# Patient Record
Sex: Male | Born: 1980 | Race: Black or African American | Hispanic: No | Marital: Single | Smoking: Former smoker
Health system: Southern US, Community
[De-identification: ages and names within clinical notes are randomized; demographics above are authoritative.]

## PROBLEM LIST (undated history)

## (undated) ENCOUNTER — Emergency Department (HOSPITAL_COMMUNITY): Disposition: A | Discharge status: Home / Self Care | Payer: Self-pay

## (undated) DIAGNOSIS — I7101 Dissection of thoracic aorta: Secondary | ICD-10-CM

## (undated) DIAGNOSIS — I10 Essential (primary) hypertension: Secondary | ICD-10-CM

## (undated) DIAGNOSIS — Z9889 Other specified postprocedural states: Secondary | ICD-10-CM

## (undated) HISTORY — PX: FOOT SURGERY: SHX648

---
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
## 1999-01-19 ENCOUNTER — Encounter: Admission: RE | Admit: 1999-01-19 | Discharge: 1999-01-19 | Payer: Self-pay | Admitting: Family Medicine

## 2000-05-16 ENCOUNTER — Encounter: Admission: RE | Admit: 2000-05-16 | Discharge: 2000-05-16 | Payer: Self-pay | Admitting: Family Medicine

## 2000-10-20 ENCOUNTER — Encounter: Payer: Self-pay | Admitting: Emergency Medicine

## 2000-10-20 ENCOUNTER — Emergency Department (HOSPITAL_COMMUNITY): Admission: EM | Admit: 2000-10-20 | Discharge: 2000-10-20 | Payer: Self-pay | Admitting: Emergency Medicine

## 2000-10-24 ENCOUNTER — Encounter: Admission: RE | Admit: 2000-10-24 | Discharge: 2000-10-24 | Payer: Self-pay | Admitting: Family Medicine

## 2000-10-28 ENCOUNTER — Encounter: Admission: RE | Admit: 2000-10-28 | Discharge: 2000-10-28 | Payer: Self-pay | Admitting: Family Medicine

## 2001-04-01 ENCOUNTER — Encounter: Admission: RE | Admit: 2001-04-01 | Discharge: 2001-04-01 | Payer: Self-pay | Admitting: Family Medicine

## 2001-07-01 ENCOUNTER — Encounter: Admission: RE | Admit: 2001-07-01 | Discharge: 2001-07-01 | Payer: Self-pay | Admitting: Family Medicine

## 2002-01-18 ENCOUNTER — Encounter: Admission: RE | Admit: 2002-01-18 | Discharge: 2002-01-18 | Payer: Self-pay | Admitting: Family Medicine

## 2002-10-04 ENCOUNTER — Emergency Department (HOSPITAL_COMMUNITY): Admission: EM | Admit: 2002-10-04 | Discharge: 2002-10-04 | Payer: Self-pay | Admitting: Emergency Medicine

## 2003-09-25 ENCOUNTER — Emergency Department (HOSPITAL_COMMUNITY): Admission: EM | Admit: 2003-09-25 | Discharge: 2003-09-25 | Payer: Self-pay | Admitting: Emergency Medicine

## 2003-09-28 ENCOUNTER — Encounter: Admission: RE | Admit: 2003-09-28 | Discharge: 2003-09-28 | Payer: Self-pay | Admitting: Family Medicine

## 2003-10-04 ENCOUNTER — Emergency Department (HOSPITAL_COMMUNITY): Admission: EM | Admit: 2003-10-04 | Discharge: 2003-10-04 | Payer: Self-pay | Admitting: Emergency Medicine

## 2003-10-05 ENCOUNTER — Encounter: Admission: RE | Admit: 2003-10-05 | Discharge: 2003-10-05 | Payer: Self-pay | Admitting: Family Medicine

## 2004-01-09 ENCOUNTER — Emergency Department (HOSPITAL_COMMUNITY): Admission: EM | Admit: 2004-01-09 | Discharge: 2004-01-09 | Payer: Self-pay | Admitting: Emergency Medicine

## 2004-01-12 ENCOUNTER — Emergency Department (HOSPITAL_COMMUNITY): Admission: EM | Admit: 2004-01-12 | Discharge: 2004-01-12 | Payer: Self-pay | Admitting: Emergency Medicine

## 2004-03-12 ENCOUNTER — Emergency Department (HOSPITAL_COMMUNITY): Admission: EM | Admit: 2004-03-12 | Discharge: 2004-03-12 | Payer: Self-pay | Admitting: Emergency Medicine

## 2004-03-15 ENCOUNTER — Emergency Department (HOSPITAL_COMMUNITY): Admission: EM | Admit: 2004-03-15 | Discharge: 2004-03-15 | Payer: Self-pay | Admitting: Emergency Medicine

## 2004-06-18 ENCOUNTER — Emergency Department (HOSPITAL_COMMUNITY): Admission: EM | Admit: 2004-06-18 | Discharge: 2004-06-18 | Payer: Self-pay | Admitting: Emergency Medicine

## 2005-03-17 ENCOUNTER — Emergency Department (HOSPITAL_COMMUNITY): Admission: EM | Admit: 2005-03-17 | Discharge: 2005-03-17 | Payer: Self-pay | Admitting: Emergency Medicine

## 2005-06-06 ENCOUNTER — Emergency Department (HOSPITAL_COMMUNITY): Admission: EM | Admit: 2005-06-06 | Discharge: 2005-06-06 | Payer: Self-pay | Admitting: Emergency Medicine

## 2006-07-24 DIAGNOSIS — I10 Essential (primary) hypertension: Secondary | ICD-10-CM

## 2006-07-24 DIAGNOSIS — F17200 Nicotine dependence, unspecified, uncomplicated: Secondary | ICD-10-CM | POA: Insufficient documentation

## 2006-12-25 ENCOUNTER — Emergency Department (HOSPITAL_COMMUNITY): Admission: EM | Admit: 2006-12-25 | Discharge: 2006-12-25 | Payer: Self-pay | Admitting: Emergency Medicine

## 2007-01-17 ENCOUNTER — Emergency Department (HOSPITAL_COMMUNITY): Admission: EM | Admit: 2007-01-17 | Discharge: 2007-01-17 | Payer: Self-pay | Admitting: Emergency Medicine

## 2007-01-31 ENCOUNTER — Emergency Department (HOSPITAL_COMMUNITY): Admission: EM | Admit: 2007-01-31 | Discharge: 2007-01-31 | Payer: Self-pay | Admitting: Emergency Medicine

## 2007-02-20 ENCOUNTER — Emergency Department (HOSPITAL_COMMUNITY): Admission: EM | Admit: 2007-02-20 | Discharge: 2007-02-20 | Payer: Self-pay | Admitting: Emergency Medicine

## 2007-08-17 ENCOUNTER — Emergency Department (HOSPITAL_COMMUNITY): Admission: EM | Admit: 2007-08-17 | Discharge: 2007-08-17 | Payer: Self-pay | Admitting: Emergency Medicine

## 2008-01-05 ENCOUNTER — Emergency Department (HOSPITAL_COMMUNITY): Admission: EM | Admit: 2008-01-05 | Discharge: 2008-01-05 | Payer: Self-pay | Admitting: Emergency Medicine

## 2008-03-08 ENCOUNTER — Emergency Department (HOSPITAL_COMMUNITY): Admission: EM | Admit: 2008-03-08 | Discharge: 2008-03-08 | Payer: Self-pay | Admitting: Emergency Medicine

## 2008-03-20 ENCOUNTER — Emergency Department (HOSPITAL_COMMUNITY): Admission: EM | Admit: 2008-03-20 | Discharge: 2008-03-20 | Payer: Self-pay | Admitting: Emergency Medicine

## 2008-09-07 ENCOUNTER — Emergency Department (HOSPITAL_COMMUNITY): Admission: EM | Admit: 2008-09-07 | Discharge: 2008-09-07 | Payer: Self-pay | Admitting: Emergency Medicine

## 2008-11-03 ENCOUNTER — Emergency Department (HOSPITAL_COMMUNITY): Admission: EM | Admit: 2008-11-03 | Discharge: 2008-11-03 | Payer: Self-pay | Admitting: Emergency Medicine

## 2009-01-24 ENCOUNTER — Emergency Department (HOSPITAL_COMMUNITY): Admission: EM | Admit: 2009-01-24 | Discharge: 2009-01-24 | Payer: Self-pay | Admitting: Emergency Medicine

## 2009-04-19 ENCOUNTER — Emergency Department (HOSPITAL_COMMUNITY): Admission: EM | Admit: 2009-04-19 | Discharge: 2009-04-19 | Payer: Self-pay | Admitting: Family Medicine

## 2009-12-23 ENCOUNTER — Observation Stay (HOSPITAL_COMMUNITY): Admission: EM | Admit: 2009-12-23 | Discharge: 2009-12-24 | Payer: Self-pay | Admitting: Emergency Medicine

## 2010-03-28 ENCOUNTER — Emergency Department (HOSPITAL_COMMUNITY)
Admission: EM | Admit: 2010-03-28 | Discharge: 2010-03-28 | Payer: Self-pay | Source: Home / Self Care | Admitting: Family Medicine

## 2010-03-28 ENCOUNTER — Ambulatory Visit (HOSPITAL_COMMUNITY): Admission: RE | Admit: 2010-03-28 | Discharge: 2010-03-28 | Payer: Self-pay | Admitting: Family Medicine

## 2010-08-11 LAB — CBC
MCH: 31 pg (ref 26.0–34.0)
MCHC: 35.3 g/dL (ref 30.0–36.0)
MCV: 87.7 fL (ref 78.0–100.0)
Platelets: 249 10*3/uL (ref 150–400)
RBC: 5.38 MIL/uL (ref 4.22–5.81)

## 2010-08-11 LAB — POCT I-STAT, CHEM 8
BUN: 12 mg/dL (ref 6–23)
Calcium, Ion: 1.1 mmol/L — ABNORMAL LOW (ref 1.12–1.32)
Creatinine, Ser: 1.2 mg/dL (ref 0.4–1.5)
Glucose, Bld: 118 mg/dL — ABNORMAL HIGH (ref 70–99)
Hemoglobin: 17.7 g/dL — ABNORMAL HIGH (ref 13.0–17.0)
TCO2: 20 mmol/L (ref 0–100)

## 2010-08-11 LAB — DIFFERENTIAL
Basophils Relative: 1 % (ref 0–1)
Eosinophils Absolute: 0.2 10*3/uL (ref 0.0–0.7)
Eosinophils Relative: 2 % (ref 0–5)
Lymphs Abs: 4.5 10*3/uL — ABNORMAL HIGH (ref 0.7–4.0)
Neutrophils Relative %: 45 % (ref 43–77)

## 2010-09-03 LAB — GC/CHLAMYDIA PROBE AMP, GENITAL
Chlamydia, DNA Probe: NEGATIVE
GC Probe Amp, Genital: NEGATIVE

## 2010-09-03 LAB — URINALYSIS, ROUTINE W REFLEX MICROSCOPIC
Bilirubin Urine: NEGATIVE
Hgb urine dipstick: NEGATIVE
Ketones, ur: NEGATIVE mg/dL
Specific Gravity, Urine: 1.023 (ref 1.005–1.030)
Urobilinogen, UA: 1 mg/dL (ref 0.0–1.0)
pH: 6 (ref 5.0–8.0)

## 2010-09-12 ENCOUNTER — Emergency Department (HOSPITAL_COMMUNITY)
Admission: EM | Admit: 2010-09-12 | Discharge: 2010-09-13 | Disposition: A | Discharge status: Home / Self Care | Payer: Self-pay | Attending: Emergency Medicine | Admitting: Emergency Medicine

## 2010-09-12 DIAGNOSIS — I10 Essential (primary) hypertension: Secondary | ICD-10-CM | POA: Insufficient documentation

## 2010-09-12 DIAGNOSIS — R509 Fever, unspecified: Secondary | ICD-10-CM | POA: Insufficient documentation

## 2010-09-12 DIAGNOSIS — W57XXXA Bitten or stung by nonvenomous insect and other nonvenomous arthropods, initial encounter: Secondary | ICD-10-CM | POA: Insufficient documentation

## 2010-09-12 DIAGNOSIS — S30860A Insect bite (nonvenomous) of lower back and pelvis, initial encounter: Secondary | ICD-10-CM | POA: Insufficient documentation

## 2010-09-12 DIAGNOSIS — J3489 Other specified disorders of nose and nasal sinuses: Secondary | ICD-10-CM | POA: Insufficient documentation

## 2010-09-12 DIAGNOSIS — R51 Headache: Secondary | ICD-10-CM | POA: Insufficient documentation

## 2010-10-20 ENCOUNTER — Emergency Department (HOSPITAL_COMMUNITY)
Admission: EM | Admit: 2010-10-20 | Discharge: 2010-10-21 | Disposition: A | Discharge status: Home / Self Care | Payer: Self-pay | Attending: Emergency Medicine | Admitting: Emergency Medicine

## 2010-10-20 DIAGNOSIS — R197 Diarrhea, unspecified: Secondary | ICD-10-CM | POA: Insufficient documentation

## 2010-10-20 DIAGNOSIS — I10 Essential (primary) hypertension: Secondary | ICD-10-CM | POA: Insufficient documentation

## 2010-10-20 DIAGNOSIS — IMO0001 Reserved for inherently not codable concepts without codable children: Secondary | ICD-10-CM | POA: Insufficient documentation

## 2010-10-20 DIAGNOSIS — R509 Fever, unspecified: Secondary | ICD-10-CM | POA: Insufficient documentation

## 2010-10-20 DIAGNOSIS — L02219 Cutaneous abscess of trunk, unspecified: Secondary | ICD-10-CM | POA: Insufficient documentation

## 2010-10-20 DIAGNOSIS — E860 Dehydration: Secondary | ICD-10-CM | POA: Insufficient documentation

## 2010-10-20 LAB — POCT I-STAT, CHEM 8
Calcium, Ion: 1.14 mmol/L (ref 1.12–1.32)
Chloride: 104 mEq/L (ref 96–112)
Creatinine, Ser: 1.6 mg/dL — ABNORMAL HIGH (ref 0.4–1.5)
Glucose, Bld: 94 mg/dL (ref 70–99)
Potassium: 3.6 mEq/L (ref 3.5–5.1)

## 2010-12-18 ENCOUNTER — Inpatient Hospital Stay (INDEPENDENT_AMBULATORY_CARE_PROVIDER_SITE_OTHER)
Admission: RE | Admit: 2010-12-18 | Discharge: 2010-12-18 | Disposition: A | Discharge status: Home / Self Care | Payer: Self-pay | Source: Ambulatory Visit | Attending: Emergency Medicine | Admitting: Emergency Medicine

## 2010-12-18 DIAGNOSIS — L03039 Cellulitis of unspecified toe: Secondary | ICD-10-CM

## 2010-12-22 LAB — CULTURE, ROUTINE-ABSCESS: Culture: NO GROWTH

## 2011-02-25 LAB — COMPREHENSIVE METABOLIC PANEL
Albumin: 4.4
BUN: 12
Calcium: 9.6
Chloride: 104
Creatinine, Ser: 1.07
Total Bilirubin: 0.9

## 2011-02-25 LAB — GC/CHLAMYDIA PROBE AMP, GENITAL
Chlamydia, DNA Probe: NEGATIVE
GC Probe Amp, Genital: NEGATIVE
GC Probe Amp, Genital: NEGATIVE

## 2011-02-25 LAB — URINALYSIS, ROUTINE W REFLEX MICROSCOPIC
Bilirubin Urine: NEGATIVE
Ketones, ur: NEGATIVE
Protein, ur: NEGATIVE
Urobilinogen, UA: 0.2

## 2011-02-25 LAB — DIFFERENTIAL
Basophils Absolute: 0
Lymphocytes Relative: 38
Lymphs Abs: 2.3
Monocytes Absolute: 0.8
Neutro Abs: 2.9

## 2011-02-25 LAB — CBC
HCT: 47.4
MCHC: 34.6
MCV: 87.6
Platelets: 262
RDW: 13.3
WBC: 6

## 2011-02-25 LAB — RPR: RPR Ser Ql: NONREACTIVE

## 2011-03-07 LAB — CULTURE, ROUTINE-ABSCESS

## 2011-03-08 LAB — URINALYSIS, ROUTINE W REFLEX MICROSCOPIC
Protein, ur: NEGATIVE
Urobilinogen, UA: 1

## 2011-03-08 LAB — DIFFERENTIAL
Basophils Relative: 0
Eosinophils Absolute: 0.3
Neutrophils Relative %: 52

## 2011-03-08 LAB — CBC
MCHC: 35.5
MCV: 84.7
Platelets: 269

## 2011-03-08 LAB — BASIC METABOLIC PANEL
BUN: 14
CO2: 26
Chloride: 107
Creatinine, Ser: 1.22

## 2011-03-08 LAB — GC/CHLAMYDIA PROBE AMP, GENITAL: GC Probe Amp, Genital: NEGATIVE

## 2011-03-11 LAB — URINALYSIS, ROUTINE W REFLEX MICROSCOPIC
Glucose, UA: NEGATIVE
Hgb urine dipstick: NEGATIVE
Specific Gravity, Urine: 1.03
pH: 6

## 2011-03-11 LAB — URINE CULTURE
Colony Count: NO GROWTH
Culture: NO GROWTH

## 2011-07-18 ENCOUNTER — Encounter (HOSPITAL_COMMUNITY): Payer: Self-pay | Admitting: Emergency Medicine

## 2011-07-18 ENCOUNTER — Emergency Department (INDEPENDENT_AMBULATORY_CARE_PROVIDER_SITE_OTHER): Payer: Self-pay

## 2011-07-18 ENCOUNTER — Emergency Department (INDEPENDENT_AMBULATORY_CARE_PROVIDER_SITE_OTHER)
Admission: EM | Admit: 2011-07-18 | Discharge: 2011-07-18 | Disposition: A | Discharge status: Home Health | Payer: Self-pay | Source: Home / Self Care | Attending: Emergency Medicine | Admitting: Emergency Medicine

## 2011-07-18 DIAGNOSIS — J111 Influenza due to unidentified influenza virus with other respiratory manifestations: Secondary | ICD-10-CM

## 2011-07-18 HISTORY — DX: Essential (primary) hypertension: I10

## 2011-07-18 MED ORDER — GUAIFENESIN-CODEINE 100-10 MG/5ML PO SYRP: 10.0000 mL | ORAL_SOLUTION | Freq: Four times a day (QID) | ORAL | Status: AC | PRN

## 2011-07-18 MED ORDER — OSELTAMIVIR PHOSPHATE 75 MG PO CAPS: 75.0000 mg | ORAL_CAPSULE | Freq: Two times a day (BID) | ORAL | Status: AC

## 2011-07-18 MED ORDER — TRAMADOL HCL 50 MG PO TABS: 100.0000 mg | ORAL_TABLET | Freq: Three times a day (TID) | ORAL | Status: AC | PRN

## 2011-07-18 NOTE — Discharge Instructions (Signed)
Influenza Facts Flu (influenza) is a contagious respiratory illness caused by the influenza viruses. It can cause mild to severe illness. While most healthy people recover from the flu without specific treatment and without complications, older people, young children, and people with certain health conditions are at higher risk for serious complications from the flu, including death. CAUSES   The flu virus is spread from person to person by respiratory droplets from coughing and sneezing.   A person can also become infected by touching an object or surface with a virus on it and then touching their mouth, eye or nose.   Adults may be able to infect others from 1 day before symptoms occur and up to 7 days after getting sick. So it is possible to give someone the flu even before you know you are sick and continue to infect others while you are sick.  SYMPTOMS   Fever (usually high).   Headache.   Tiredness (can be extreme).   Cough.   Sore throat.   Runny or stuffy nose.   Body aches.   Diarrhea and vomiting may also occur, particularly in children.   These symptoms are referred to as "flu-like symptoms". A lot of different illnesses, including the common cold, can have similar symptoms.  DIAGNOSIS   There are tests that can determine if you have the flu as long you are tested within the first 2 or 3 days of illness.   A doctor's exam and additional tests may be needed to identify if you have a disease that is a complicating the flu.  RISKS AND COMPLICATIONS  Some of the complications caused by the flu include:  Bacterial pneumonia or progressive pneumonia caused by the flu virus.   Loss of body fluids (dehydration).   Worsening of chronic medical conditions, such as heart failure, asthma, or diabetes.   Sinus problems and ear infections.  HOME CARE INSTRUCTIONS   Seek medical care early on.   If you are at high risk from complications of the flu, consult your health-care  provider as soon as you develop flu-like symptoms. Those at high risk for complications include:   People 65 years or older.   People with chronic medical conditions, including diabetes.   Pregnant women.   Young children.   Your caregiver may recommend use of an antiviral medication to help treat the flu.   If you get the flu, get plenty of rest, drink a lot of liquids, and avoid using alcohol and tobacco.   You can take over-the-counter medications to relieve the symptoms of the flu if your caregiver approves. (Never give aspirin to children or teenagers who have flu-like symptoms, particularly fever).  PREVENTION  The single best way to prevent the flu is to get a flu vaccine each fall. Other measures that can help protect against the flu are:  Antiviral Medications   A number of antiviral drugs are approved for use in preventing the flu. These are prescription medications, and a doctor should be consulted before they are used.   Habits for Good Health   Cover your nose and mouth with a tissue when you cough or sneeze, throw the tissue away after you use it.   Wash your hands often with soap and water, especially after you cough or sneeze. If you are not near water, use an alcohol-based hand cleaner.   Avoid people who are sick.   If you get the flu, stay home from work or school. Avoid contact with   other people so that you do not make them sick, too.   Try not to touch your eyes, nose, or mouth as germs ore often spread this way.  IN CHILDREN, EMERGENCY WARNING SIGNS THAT NEED URGENT MEDICAL ATTENTION:  Fast breathing or trouble breathing.   Bluish skin color.   Not drinking enough fluids.   Not waking up or not interacting.   Being so irritable that the child does not want to be held.   Flu-like symptoms improve but then return with fever and worse cough.   Fever with a rash.  IN ADULTS, EMERGENCY WARNING SIGNS THAT NEED URGENT MEDICAL ATTENTION:  Difficulty  breathing or shortness of breath.   Pain or pressure in the chest or abdomen.   Sudden dizziness.   Confusion.   Severe or persistent vomiting.  SEEK IMMEDIATE MEDICAL CARE IF:  You or someone you know is experiencing any of the symptoms above. When you arrive at the emergency center,report that you think you have the flu. You may be asked to wear a mask and/or sit in a secluded area to protect others from getting sick. MAKE SURE YOU:   Understand these instructions.   Monitor your condition.   Seek medical care if you are getting worse, or not improving.  Document Released: 05/16/2003 Document Revised: 01/23/2011 Document Reviewed: 02/09/2009 [employer] Patient Information 2012 [employer].   Most upper respiratory infections are caused by viruses and do not require antibiotics.  We try to save the antibiotics for when we really need them to avoid resistance.  This does not mean that there is nothing that can be done.  Here are a few hints about things that can be done at home to get over an upper respiratory infection quicker:  Get extra sleep and extra fluids.  Get 7 to 9 hours of sleep per night and 6 to 8 glasses of water a day.  Getting extra sleep keeps the immune system from getting run down.  Most people with an upper respiratory infection are a little dehydrated.  The extra fluids also keep the secretions liquified and easier to deal with.  Also, get extra vitamin C.  4000 mg per day is the recommended dose. For the aches, headache, and fever, acetaminophen or ibuprofen are helpful.  These can be alternated every 4 hours.  People with liver disease should avoid large amounts of acetaminophen, and people with ulcer disease, gastroesophageal reflux, gastritis, congestive heart failure, chronic kidney disease, coronary artery disease and the elderly should avoid ibuprofen. For nasal congestion try Mucinex-D, or if you're having lots of sneezing or copious clear nasal drainage  Allegra-D-24 hour.  A Saline nasal spray such as Ocean Spray can also help as can decongestant sprays such as Afrin, but you should not use the decongestant sprays for more than 3 or 4 days since they can be habituating.  If nasal dryness is a problem, Ayr Nasal Gel can help moisturize your nasal passages.  Breath Rite nasal strips can also offer a non-drug alternative treatment to nasal congestion, especially at night. For people with symptoms of sinusitis, sleeping with your head elevated can be helpful.  For sinus pain, moist, hot compresses to the face may provide some relief.  Many people find that inhaling steam as in a shower or from a pot of steaming water can help. For sore throat, zinc containing lozenges such as Cold-Eze or Zicam are helpful.  Zinc helps to fight infection and has a mild astringent effect that   relieves the sore, achey throat.  Hot salt water gargles (8 oz of hot water, 1/2 tsp of table salt, and a pinch of baking soda) can give relief as well as hot beverages such as hot tea. For the cough, old time remedies such as honey or honey and lemon are tried and true.  Over the counter cough syrups such as Delsym 2 tsp every 12 hours can help as well.  It's important when you have an upper respiratory infection not to pass the infection to others.  This involves being very careful about the following:  Frequent hand washing or use of hand sanitizer, especially after coughing, sneezing, blowing your nose or touching your face, nose or eyes. Do not shake hands or touch anyone and try to avoid touching surfaces that other people use such as doorknobs, shopping carts, telephones and computer keyboards. Use tissues and dispose of them properly in a garbage can or ziplock bag. Cough into your sleeve. Do not let others eat or drink after you.  It's also important to recognize the signs of serious illness and get evaluated if they occur: Any respiratory infection that lasts more than 7 to  10 days.  Yellow nasal drainage and sputum are not reliable indicators of a bacterial infection, but if they last for more than 1 week, see your doctor. Fever and sore throat can indicate strep. Fever and cough can indicate influenza or pneumonia. Any kind of severe symptom such as difficulty breathing, intractable vomiting, or severe pain should prompt you to see a doctor as soon as possible.   Your body's immune system is really the thing that will get rid of this infection.  Your immune system is comprised of 2 types of specialized cells called T cells and B cells.  T cells coordinate the array of cells in your body that engulf invading bacteria or viruses while B cells orchestrate the production of antibodies that neutralize infection.  Anything we do or any medications we give you, will just strengthen your immune system or help it clear up the infection quicker.  Here are a few helpful hints to improve your immune system to help overcome this illness or to prevent future infections:  A few vitamins can improve the health of your immune system.  That's why your diet should include plenty of fruits, vegetables, fish, nuts, and whole grains.  Vitamin A and bet-carotene can increase the cells that fight infections (T cells and B cells).  Vitamin A is abundant in dark greens and orange vegetables such as spinach, greens, sweet potatoes, and carrots.  Vitamin B6 contributes to the maturation of white blood cells, the cells that fight disease.  Foods with vitamin B6 include cold cereal and bananas.  Vitamin C is credited with preventing colds because it increases white blood cells and also prevents cellular damage.  Citrus fruits, peaches and green and red bell peppers are all hight in vitamin C.  Vitamin E is an anti-oxidant that encourages the production of natural killer cells which reject foreign invaders and B cells that produce antibodies.  Foods high in vitamin E include wheat germ, nuts and  seeds.  Foods high in omega-3 fatty acids found in foods like salmon, tuna and mackerel boost your immune system and help cells to engulf and absorb germs.  Probiotics are good bacteria that increase your T cells.  These can be found in yogurt and are available in supplements such as Culturelle or Align.  Moderate exercise increases the   strength of your immune system and your ability to recover from illness.  I suggest 3 to 5 moderate intensity 30 minute workouts per week.    Sleep is another component of maintaining a strong immune system.  It enables your body to recuperate from the day's activities, stress and work.  My recommendation is to get between 7 and 9 hours of sleep per night.  If you smoke, try to quit completely or at least cut down.  Drink alcohol only in moderation if at all.  No more than 2 drinks daily for men or 1 for women.  Get a flu vaccine early in the fall or if you have not gotten one yet, once this illness has run its course.  If you are over 65, a smoker, or an asthmatic, get a pneumococcal vaccine.  My final recommendation is to maintain a healthy weight.  Excess weight can impair the immune system by interfering with the way the immune system deals with invading viruses or bacteria.   

## 2011-07-18 NOTE — ED Notes (Signed)
HERE WITH FLU LIKE SX THAT STARTED X 3DYS AGO WITH SORE THROAT AND NOW WORSENING COUGH WITH DK YELLOW MUCOUS,BODY ACHES,CHILLS AND DIFF SWALLOWING OR EATING,SOB AND CP.AFEBRILE.PT HAS BEEN TAKING OTC MEDS BUT NOT WORKING

## 2011-07-18 NOTE — ED Provider Notes (Signed)
Chief Complaint  Patient presents with  . Influenza  . Sore Throat    History of Present Illness:   The patient is a 31 year old male who presents today with a 2 to three-day history of chills, fever, myalgias, headache, and chest pain. He also has had a cough productive of yellow to brown sputum with some blood, wheezing, severe sore throat, and nasal congestion with yellow drainage. He denies any nausea, vomiting, or diarrhea. He has not been exposed to strep, flu, or mono.  Review of Systems:  Other than noted above, the patient denies any of the following symptoms. Systemic:  No fever, chills, sweats, fatigue, myalgias, headache, or anorexia. Eye:  No redness, pain or drainage. ENT:  No earache, nasal congestion, rhinorrhea, sinus pressure, or sore throat. Lungs:  No cough, sputum production, wheezing, shortness of breath. Or chest pain. GI:  No nausea, vomiting, abdominal pain or diarrhea. Skin:  No rash or itching.  PMFSH:  Past medical history, family history, social history, meds, and allergies were reviewed.  Physical Exam:   Vital signs:  BP 111/76  Pulse 101  Temp(Src) 98.3 F (36.8 C) (Oral)  Resp 18  SpO2 96% General:  Alert, in no distress. Eye:  No conjunctival injection or drainage. ENT:  TMs and canals were normal, without erythema or inflammation.  Nasal mucosa was clear and uncongested, without drainage.  Mucous membranes were moist.  Pharynx was clear, without exudate or drainage.  There were no oral ulcerations or lesions. Neck:  Supple, no adenopathy, tenderness or mass. Lungs:  No respiratory distress.  Lungs were clear to auscultation, without wheezes, rales or rhonchi.  Breath sounds were clear and equal bilaterally. Heart:  Regular rhythm, without gallops, murmers or rubs. Skin:  Clear, warm, and dry, without rash or lesions.  Labs:   Results for orders placed during the hospital encounter of 07/18/11  POCT RAPID STREP A ([facility] ONLY)      Component  Value Range   Streptococcus, Group A Screen (Direct) NEGATIVE  NEGATIVE   POCT INFECTIOUS MONO SCREEN      Component Value Range   Mono Screen NEGATIVE  NEGATIVE      Radiology:  Dg Chest 2 View  07/18/2011  *RADIOLOGY REPORT*  Clinical Data: Left-sided chest pain  CHEST - 2 VIEW  Comparison: 09/07/2008  Findings: Normal heart size.  Clear lungs.  IMPRESSION: Negative.  Original Report Authenticated By: Donavan Burnet, M.D.    Assessment:   Diagnoses that have been ruled out:  None  Diagnoses that are still under consideration:  None  Final diagnoses:  Influenza-like illness      Plan:   1.  The following meds were prescribed:   New Prescriptions   GUAIFENESIN-CODEINE (GUIATUSS AC) 100-10 MG/5ML SYRUP    Take 10 mLs by mouth 4 (four) times daily as needed for cough.   OSELTAMIVIR (TAMIFLU) 75 MG CAPSULE    Take 1 capsule (75 mg total) by mouth every 12 (twelve) hours.   TRAMADOL (ULTRAM) 50 MG TABLET    Take 2 tablets (100 mg total) by mouth every 8 (eight) hours as needed for pain.   2.  The patient was instructed in symptomatic care and handouts were given. 3.  The patient was told to return if becoming worse in any way, if no better in 3 or 4 days, and given some red flag symptoms that would indicate earlier return.   Roque Lias, MD 07/18/11 [phone]

## 2013-10-22 ENCOUNTER — Emergency Department (INDEPENDENT_AMBULATORY_CARE_PROVIDER_SITE_OTHER)
Admission: EM | Admit: 2013-10-22 | Discharge: 2013-10-22 | Disposition: A | Discharge status: Home / Self Care | Payer: PRIVATE HEALTH INSURANCE | Source: Home / Self Care | Attending: Family Medicine | Admitting: Family Medicine

## 2013-10-22 ENCOUNTER — Encounter (HOSPITAL_COMMUNITY): Payer: Self-pay | Admitting: Emergency Medicine

## 2013-10-22 DIAGNOSIS — T675XXA Heat exhaustion, unspecified, initial encounter: Secondary | ICD-10-CM

## 2013-10-22 DIAGNOSIS — X58XXXA Exposure to other specified factors, initial encounter: Secondary | ICD-10-CM

## 2013-10-22 DIAGNOSIS — I10 Essential (primary) hypertension: Secondary | ICD-10-CM

## 2013-10-22 LAB — POCT I-STAT, CHEM 8
BUN: 16 mg/dL (ref 6–23)
CHLORIDE: 104 meq/L (ref 96–112)
Calcium, Ion: 1.16 mmol/L (ref 1.12–1.23)
Creatinine, Ser: 1.1 mg/dL (ref 0.50–1.35)
Glucose, Bld: 102 mg/dL — ABNORMAL HIGH (ref 70–99)
HEMATOCRIT: 50 % (ref 39.0–52.0)
Hemoglobin: 17 g/dL (ref 13.0–17.0)
Potassium: 3.8 mEq/L (ref 3.7–5.3)
SODIUM: 141 meq/L (ref 137–147)
TCO2: 27 mmol/L (ref 0–100)

## 2013-10-22 MED ORDER — AMLODIPINE BESYLATE 5 MG PO TABS: 5.0000 mg | ORAL_TABLET | Freq: Every day | ORAL | Status: DC

## 2013-10-22 NOTE — ED Notes (Signed)
C/o hypertension States he is dizzy, feels lightheaded, feet and hands are numb and sweaty body States while he was at work sx came as he was outside taking out the [employer] he drinks plenty of water daily States he used to take BP meds but has been out for two months

## 2013-10-22 NOTE — ED Provider Notes (Signed)
CSN: [id number]     Arrival date & time 10/22/13  1422 History   First MD Initiated Contact with Patient 10/22/13 1545     Chief Complaint  Patient presents with  . Hypertension   (Consider location/radiation/quality/duration/timing/severity/associated sxs/prior Treatment) HPI Comments: 33 year old male presents complaining of having an episode for dizziness earlier at work. He was wearing all black clothes working out in the hot sun when he became lightheaded, extremely sweaty, and his hands and feet felt numb. He went inside and sat in air conditioning and put a cool towel on his neck and this all got better. He is feeling fine now. He thinks this may have been from heat or it may have been because he is out of his blood pressure medicine. He diagnosed with hypertension at age 1 and was put on Lopressor, however he does not like the way it makes him feel so he stopped going to the doctor 2 years ago. He does not currently have a primary care provider. He does have health insurance now and he wants to know who he should go see. Right now, he is completely asymptomatic. He does smoke cigarettes.  He drinks plenty of fluids. He does not watch his diet but is not overweight because he stays active.  Patient is a 33 y.o. male presenting with hypertension.  Hypertension    Past Medical History  Diagnosis Date  . Hypertension    Past Surgical History  Procedure Laterality Date  . Foot surgery     History reviewed. No pertinent family history. History  Substance Use Topics  . Smoking status: Current Every Day Smoker  . Smokeless tobacco: Not on file  . Alcohol Use: Yes    Review of Systems  Constitutional: Positive for diaphoresis.  Neurological: Positive for dizziness and numbness.  All other systems reviewed and are negative.   Allergies  Review of patient's allergies indicates no known allergies.  Home Medications   Prior to Admission medications   Medication Sig Start Date  End Date Taking? Authorizing Provider  amLODipine (NORVASC) 5 MG tablet Take 1 tablet (5 mg total) by mouth daily. 10/22/13   Adrian Blackwater Shevelle Smither, PA-C  lisinopril (PRINIVIL,ZESTRIL) 10 MG tablet Take 10 mg by mouth daily.    Historical Provider, MD   BP 156/96  Pulse 90  Temp(Src) 98.2 F (36.8 C) (Oral)  Resp 18  SpO2 100% Physical Exam  Nursing note and vitals reviewed. Constitutional: He is oriented to person, place, and time. He appears well-developed and well-nourished. No distress.  HENT:  Head: Normocephalic.  Cardiovascular: Normal rate, regular rhythm and normal heart sounds.   Pulmonary/Chest: Effort normal and breath sounds normal. No respiratory distress.  Neurological: He is alert and oriented to person, place, and time. He has normal strength. No cranial nerve deficit or sensory deficit. Coordination and gait normal.  Skin: Skin is warm and dry. No rash noted. He is not diaphoretic.  Psychiatric: He has a normal mood and affect. Judgment normal.    ED Course  Procedures (including critical care time) Labs Review Labs Reviewed - No data to display  Imaging Review No results found.   MDM   1. Heat exhaustion, unspecified   2. Hypertension    Transient episode of heat exhaustion, he is fine now. I will start him on antihypertensive, I discussed with him strategies for finding a primary care provider. Followup as needed   Meds ordered this encounter  Medications  . amLODipine (NORVASC) 5 MG tablet  Sig: Take 1 tablet (5 mg total) by mouth daily.    Dispense:  30 tablet    Refill:  1    Order Specific Question:  Supervising Provider    Answer:  Bradd CanaryKINDL, JAMES D [[id number]]       Graylon GoodZachary H Rye Decoste, PA-C 10/22/13 [phone]

## 2013-10-22 NOTE — Discharge Instructions (Signed)
[facility]: [address] [place] [address]  [phone]  [facility]: [address] [place] [address]   [phone]  [facility]: [address] [place] [address]  [phone]  [facility] : [address] [place] [address] [phone]  [facility]: [address] [place] [address] [phone]  [facility]: [address] [place] [address] [phone]  Dr. Oneal Grout [address] [facility] [place] [facility] [place] [address]  [phone]  Dr. Jackie Plum, [facility]. [address] [place], [place] [address]  [phone]     Heat Disorders Heat related disorders are illnesses caused by continued exposure to hot and humid environments, not drinking enough fluids, and/or your body failing to regulate its temperature correctly. People suffer from heat stress and heat related disorders when their bodies are unable to compensate and cool down through sweating. With sufficient heat, sweating is not enough to keep you cool, and your body temperature can rise quickly. Very high body temperatures can damage your brain and other vital organs. High humidity (moisture in the air), adds to heat stress, because it is harder for sweat to evaporate and cool your body. Heat stress and disorders are not uncommon. Some medicines can increase your risk for heat related illness. Ask your caregiver about your medicines during periods of intense heat.  Heat related disorders include:  Heatstroke. When you cannot sweat or regulate your body temperature in an adequate way. This is very dangerous and can be life threatening. Get emergency medical help.  Heat exhaustion. Overheating causes heavy sweating and a fast heart  rate. Your body can still regulate its own temperature.  Heat cramps. Painful, uncontrollable muscle spasms. Can occur during heavy exercise in hot environments.  Sunburn. Skin becomes red and painful (burned) after being out in the sun.  Heat rash. Sweat ducts become blocked, which traps sweat under the skin. This causes blisters and red bumps and may cause an itchy or tingling feeling. PREVENTING HEAT STRESS AND HEAT RELATED DISORDERS Overheating can be dangerous and life threatening. When exercising, working, or doing other activities in hot and humid environments, do the following:  Stay informed by listening to and watching local broadcast weather and safety updates during intense heat.  Air conditioning is the best way to prevent heat disorders. If your home is not air conditioned, spend time in air conditioned places (malls, [employer], or heat shelters set up by your local health department).  Wear light-weight, light colored, loose fitting clothing. Wear as little clothing as possible when at home.  Increase your fluid intake. Drink enough water and fluids to keep your urine clear or pale yellow. DO NOT WAIT UNTIL YOU ARE THIRSTY TO DRINK. You may already be heat stressed, and not recognize it.  If your caregiver has suggested that you limit the amount of fluid you drink or has prescribed water pills for a medical problem, ask how much you should drink when the weather is hot.  Do not drink liquids with alcohol, caffeine, or lots of sugar. They can cause more loss of body fluid.  Heavy sweating drains your body's salt and minerals, which must be replaced. If you must exercise in the heat, a sports beverage can replace the salt and minerals you lose in sweat. If you  are on a low-salt diet, check with your caregiver before drinking a sports beverage.  Sunburn reduces your body's ability to cool itself and causes a loss of needed body fluids. If you go outdoors, protect yourself  from the sun by wearing a wide-brimmed hat, along with sunglasses.  Put on sunscreen of SPF 15 or higher, 30 minutes before going out. (The most effective products say "broad spectrum" or "UVA/UVB protection" on the label.) Reapply sunscreen frequently -- at least every 1-2 hours.  Take added precautions when both the heat and humidity are high.  Rest often.  Even young and otherwise healthy people can become heat stressed and suffer from a heat disorder, if they participate in strenuous activities during hot weather.  If you must be outdoors, try going out only during morning and evening hours, when it is cooler. Rest often in shady areas, so that your body's temperature can adjust.  If your heart pounds or you are gasping for breath, STOP all activity. Go immediately to a cool area, or at least into the shade, and rest. This is especially true if you become lightheaded, confused, weak, or faint.  Electric fans may make you comfortable, but they DO NOT prevent heat related problems. SYMPTOMS   Headache.  Nosebleed.  Weakness.  You feel very hot.  Muscle cramps.  Restlessness.  Fainting or dizziness.  Fast breathing and shortness of breath.  Excessive sweating. (There may be little or no sweating in late stages of heat exhaustion.)  Rapid pulse, heart pounding.  Feeling sick to your stomach (nauseous, vomiting).  Skin becoming cold and clammy, or excessively hot and dry. HOME CARE INSTRUCTIONS   Lie down and rest in a cool or air conditioned area.  Drink enough water and fluids to keep your urine clear or pale yellow. Avoid fluids with caffeine or high sugar content. Avoid coffee, tea, alcohol or stimulants.  Do not take salt tablets, unless advised by your caregiver.  Avoid hot foods and heavy meals.  Bathe or shower in cool water.  Wear minimal clothing.  Use a fan. Add cool or warm mist to the air, if possible.  If possible, decrease the use of your stove or  oven at home.  Monitor adults at risk at least twice a day, watching closely for signs of heat exhaustion or heat stroke. Infants and young children also require more frequent watching.  Never leave infants, children or pets in a parked car, even if the windows are cracked open.  If you are 34 years of age or older, have a friend or relative call to check on you twice a day during a heat wave. If you know someone in this age group, check on them at least twice a day. SEEK IMMEDIATE MEDICAL CARE IF:  You have a hard time breathing.  You vomit or pass blood in your stool.  You have a seizure, feel dizzy or faint, or pass out.  You develop severe sweating.  Your skin is red, hot and dry (there is no sweating).  Your urine turns a dark color or has blood in it.  You are making very little or no urine.  You are unable to keep fluids down.  You develop chest or abdominal pain.  You develop a throbbing headache.  You develop nausea or confusion. IF YOU OBSERVE SOMEONE WHO MIGHT HAVE HEAT STROKE This can be life threatening. Call your local emergency services (911 in the [place]  If the victim is in the  sun, get him or her to a shady area.  Cool the victim rapidly, using whatever methods you have:  Place the victim in a tub of cool water or a cool shower.  Spray the victim with cool water from a garden hose, or sponge the person with cool water.  Wrap the victim in a cool, wet sheet and fan them.  If emergency medical help is delayed, call the hospital emergency room or your local emergency services (911 in the [place] for further instructions.  Sometimes a victim's muscles will begin to twitch from heat stroke. If this happens, keep the victim from injuring himself. However, do not place any object in the mouth. Give fluids, unless the muscle twitching makes it difficult or unsafe to do so. If there is vomiting, make sure the airway remains open by turning the victim on his or her  side. Document Released: 05/10/2000 Document Revised: 08/05/2011 Document Reviewed: 02/27/2009 [facility] Patient Information 2014 [place], [place].  Hypertension As your heart beats, it forces blood through your arteries. This force is your blood pressure. If the pressure is too high, it is called hypertension (HTN) or high blood pressure. HTN is dangerous because you may have it and not know it. High blood pressure may mean that your heart has to work harder to pump blood. Your arteries may be narrow or stiff. The extra work puts you at risk for heart disease, stroke, and other problems.  Blood pressure consists of two numbers, a higher number over a lower, 110/72, for example. It is stated as "110 over 72." The ideal is below 120 for the top number (systolic) and under 80 for the bottom (diastolic). Write down your blood pressure today. You should pay close attention to your blood pressure if you have certain conditions such as:  Heart failure.  Prior heart attack.  Diabetes  Chronic kidney disease.  Prior stroke.  Multiple risk factors for heart disease. To see if you have HTN, your blood pressure should be measured while you are seated with your arm held at the level of the heart. It should be measured at least twice. A one-time elevated blood pressure reading (especially in the Emergency Department) does not mean that you need treatment. There may be conditions in which the blood pressure is different between your right and left arms. It is important to see your caregiver soon for a recheck. Most people have essential hypertension which means that there is not a specific cause. This type of high blood pressure may be lowered by changing lifestyle factors such as:  Stress.  Smoking.  Lack of exercise.  Excessive weight.  Drug/tobacco/alcohol use.  Eating less salt. Most people do not have symptoms from high blood pressure until it has caused damage to the body. Effective treatment  can often prevent, delay or reduce that damage. TREATMENT  When a cause has been identified, treatment for high blood pressure is directed at the cause. There are a large number of medications to treat HTN. These fall into several categories, and your caregiver will help you select the medicines that are best for you. Medications may have side effects. You should review side effects with your caregiver. If your blood pressure stays high after you have made lifestyle changes or started on medicines,   Your medication(s) may need to be changed.  Other problems may need to be addressed.  Be certain you understand your prescriptions, and know how and when to take your medicine.  Be sure to  follow up with your caregiver within the time frame advised (usually within two weeks) to have your blood pressure rechecked and to review your medications.  If you are taking more than one medicine to lower your blood pressure, make sure you know how and at what times they should be taken. Taking two medicines at the same time can result in blood pressure that is too low. SEEK IMMEDIATE MEDICAL CARE IF:  You develop a severe headache, blurred or changing vision, or confusion.  You have unusual weakness or numbness, or a faint feeling.  You have severe chest or abdominal pain, vomiting, or breathing problems. MAKE SURE YOU:   Understand these instructions.  Will watch your condition.  Will get help right away if you are not doing well or get worse. Document Released: 05/13/2005 Document Revised: 08/05/2011 Document Reviewed: 01/01/2008 [facility] Patient Information 2014 [place], [place]. Go to www.goodrx.com to look up your medications. This will give you a list of where you can find your prescriptions at the most affordable prices.

## 2013-10-22 NOTE — ED Provider Notes (Signed)
Medical screening examination/treatment/procedure(s) were performed by resident physician or non-physician practitioner and as supervising physician I was immediately available for consultation/collaboration.   KINDL,JAMES DOUGLAS MD.   James D Kindl, MD 10/22/13 1658 

## 2013-11-15 DIAGNOSIS — N4829 Other inflammatory disorders of penis: Secondary | ICD-10-CM | POA: Insufficient documentation

## 2013-11-15 DIAGNOSIS — F17200 Nicotine dependence, unspecified, uncomplicated: Secondary | ICD-10-CM | POA: Insufficient documentation

## 2013-11-15 DIAGNOSIS — Z79899 Other long term (current) drug therapy: Secondary | ICD-10-CM | POA: Insufficient documentation

## 2013-11-15 DIAGNOSIS — B009 Herpesviral infection, unspecified: Secondary | ICD-10-CM | POA: Insufficient documentation

## 2013-11-15 DIAGNOSIS — N4889 Other specified disorders of penis: Secondary | ICD-10-CM | POA: Insufficient documentation

## 2013-11-15 DIAGNOSIS — I10 Essential (primary) hypertension: Secondary | ICD-10-CM | POA: Insufficient documentation

## 2013-11-16 ENCOUNTER — Encounter (HOSPITAL_COMMUNITY): Payer: Self-pay | Admitting: Emergency Medicine

## 2013-11-16 ENCOUNTER — Emergency Department (HOSPITAL_COMMUNITY)
Admission: EM | Admit: 2013-11-16 | Discharge: 2013-11-16 | Disposition: A | Discharge status: Home / Self Care | Payer: PRIVATE HEALTH INSURANCE | Attending: Emergency Medicine | Admitting: Emergency Medicine

## 2013-11-16 DIAGNOSIS — B009 Herpesviral infection, unspecified: Secondary | ICD-10-CM

## 2013-11-16 DIAGNOSIS — N489 Disorder of penis, unspecified: Secondary | ICD-10-CM

## 2013-11-16 MED ORDER — ACYCLOVIR 800 MG PO TABS: 400.0000 mg | ORAL_TABLET | Freq: Every day | ORAL | Status: DC

## 2013-11-16 NOTE — ED Provider Notes (Signed)
CSN: [id number]     Arrival date & time 11/15/13  2338 History   First MD Initiated Contact with Patient 11/16/13 [phone]     Chief Complaint  Patient presents with  . Groin Swelling     (Consider location/radiation/quality/duration/timing/severity/associated sxs/prior Treatment) The history is provided by the patient.  Patient is complaining of an itchy insect bit to the abdominal wall and itching and a bump on the penis.  No f/c/r.  No pain no discharge has had unprotected encounters in the past.    Past Medical History  Diagnosis Date  . Hypertension    Past Surgical History  Procedure Laterality Date  . Foot surgery     History reviewed. No pertinent family history. History  Substance Use Topics  . Smoking status: Current Every Day Smoker  . Smokeless tobacco: Not on file  . Alcohol Use: Yes    Review of Systems  Genitourinary: Positive for penile swelling. Negative for dysuria, discharge and scrotal swelling.  All other systems reviewed and are negative.     Allergies  Review of patient's allergies indicates no known allergies.  Home Medications   Prior to Admission medications   Medication Sig Start Date End Date Taking? Authorizing Provider  acetaminophen (TYLENOL) 500 MG tablet Take 500 mg by mouth every 6 (six) hours as needed (pain).   Yes Historical Provider, MD  Metoprolol Tartrate (LOPRESSOR PO) Take by mouth.   Yes Historical Provider, MD  amLODipine (NORVASC) 5 MG tablet Take 1 tablet (5 mg total) by mouth daily. 10/22/13   Adrian BlackwaterZachary H Baker, PA-C  lisinopril (PRINIVIL,ZESTRIL) 10 MG tablet Take 10 mg by mouth daily.    Historical Provider, MD   BP 169/109  Pulse 83  Temp(Src) 97.5 F (36.4 C) (Oral)  Resp 20  Ht 6\' 1"  (1.854 m)  SpO2 99% Physical Exam  Constitutional: He is oriented to person, place, and time. He appears well-developed and well-nourished. No distress.  HENT:  Head: Normocephalic and atraumatic.  Eyes: Conjunctivae are normal.  Pupils are equal, round, and reactive to light.  Neck: Normal range of motion. Neck supple.  Cardiovascular: Normal rate, regular rhythm and intact distal pulses.   Pulmonary/Chest: Effort normal and breath sounds normal. He has no wheezes. He has no rales.  Abdominal: Soft. Bowel sounds are normal. There is no tenderness. There is no rebound.  Insolated insect bite RLQ  Genitourinary: No penile tenderness.  Has ulcerative lesions where the shaft of the penis meets the head,  There is loose tissue along the underside of the shaft without warmth erythema or fluctuance.  Chaperone present  Musculoskeletal: Normal range of motion.  Neurological: He is alert and oriented to person, place, and time.  Skin: Skin is warm and dry. No erythema.  Psychiatric: He has a normal mood and affect.    ED Course  Procedures (including critical care time) Labs Review Labs Reviewed - No data to display  Imaging Review No results found.   EKG Interpretation None      MDM   Final diagnoses:  None    Patient is only concerned about the skin on the underside of the penis, there is no warmth erythema or fluctuance.  There is nothing to drain or image.  This lesion may be reactive in nature.  Due to itching will treat ulcerative lesions will treat for herpes and have advised follow up with urology for lesion on the shaft of the penis.  Apply benadryl cream to insect bite.  Jasmine AweApril K Palumbo-Rasch, MD 11/16/13 [phone]

## 2013-11-16 NOTE — ED Notes (Signed)
Pt states he noticed he had a bump on his penis today. Pt denies pain to area but states bump itches. Pt is alert and oriented. Denies any drainage to groin area.

## 2017-03-24 ENCOUNTER — Ambulatory Visit: Payer: Self-pay | Admitting: Physician Assistant

## 2017-03-24 ENCOUNTER — Encounter (HOSPITAL_COMMUNITY): Payer: Self-pay | Admitting: Emergency Medicine

## 2017-03-24 ENCOUNTER — Ambulatory Visit (HOSPITAL_COMMUNITY)
Admission: EM | Admit: 2017-03-24 | Discharge: 2017-03-24 | Disposition: A | Discharge status: Home / Self Care | Payer: PRIVATE HEALTH INSURANCE | Attending: Emergency Medicine | Admitting: Emergency Medicine

## 2017-03-24 DIAGNOSIS — S39012A Strain of muscle, fascia and tendon of lower back, initial encounter: Secondary | ICD-10-CM

## 2017-03-24 MED ORDER — KETOROLAC TROMETHAMINE 60 MG/2ML IM SOLN
INTRAMUSCULAR | Status: AC
  Filled 2017-03-24: qty 2

## 2017-03-24 MED ORDER — AMLODIPINE BESYLATE 5 MG PO TABS: 5.0000 mg | ORAL_TABLET | Freq: Every day | ORAL | 1 refills | Status: DC

## 2017-03-24 MED ORDER — KETOROLAC TROMETHAMINE 60 MG/2ML IM SOLN
60.0000 mg | Freq: Once | INTRAMUSCULAR | Status: AC
  Administered 2017-03-24: 60 mg via INTRAMUSCULAR

## 2017-03-24 MED ORDER — METHOCARBAMOL 500 MG PO TABS: 500.0000 mg | ORAL_TABLET | Freq: Three times a day (TID) | ORAL | 0 refills | Status: DC | PRN

## 2017-03-24 NOTE — ED Provider Notes (Signed)
[facility]    CSN: [id number] Arrival date & time: 03/24/17  1445     History   Chief Complaint Chief Complaint  Patient presents with  . Back Pain    HPI Derrick Clayton is a 36 y.o. male.   [name] presents with complaints of right low back pain which started 10/25 after carrying his son from a football field to their vehicle. The following morning the pain was more severe. It has persisted. Sharp at times. Movement worsens the pain. Does not radiate to buttocks or thigh. Rates pain 10/10 at its worse. States had similar pain after injury at the gym years ago. No previous back surgery. Denies urinary symptoms or gi symptoms. Denies loss of bladder or bowel. Denies numbness tingling or extremity weakness. He has tried applying heat and muscle rub, this morning took ibuprofen, has not taken since. States that when he sits for a long period of time he has to stand to improve pain. Bending is more painful as well.   bp noted to be elevated toady. Patient states he does not have health insurance therefore has stopped filling any prescriptions and does not have a PCP. Denies headache, vision changes, dizziness, shortness of breath or chest pain.   ROS per HPI.       Past Medical History:  Diagnosis Date  . Hypertension     Patient Active Problem List   Diagnosis Date Noted  . TOBACCO DEPENDENCE 07/24/2006  . HYPERTENSION, BENIGN SYSTEMIC 07/24/2006    Past Surgical History:  Procedure Laterality Date  . FOOT SURGERY         Home Medications    Prior to Admission medications   Medication Sig Start Date End Date Taking? Authorizing Provider  acetaminophen (TYLENOL) 500 MG tablet Take 500 mg by mouth every 6 (six) hours as needed (pain).    [provider]  acyclovir (ZOVIRAX) 800 MG tablet Take 0.5 tablets (400 mg total) by mouth 5 (five) times daily. 11/16/13   Palumbo, April, MD  amLODipine (NORVASC) 5 MG tablet Take 1 tablet (5 mg total) by mouth  daily. 03/24/17   Georgetta Haber, NP  lisinopril (PRINIVIL,ZESTRIL) 10 MG tablet Take 10 mg by mouth daily.    [provider]  methocarbamol (ROBAXIN) 500 MG tablet Take 1 tablet (500 mg total) by mouth every 8 (eight) hours as needed for muscle spasms. May cause drowsiness 03/24/17   Linus Mako B, NP  Metoprolol Tartrate (LOPRESSOR PO) Take by mouth.    [provider]    Family History History reviewed. No pertinent family history.  Social History Social History  Substance Use Topics  . Smoking status: Current Every Day Smoker  . Smokeless tobacco: Not on file  . Alcohol use Yes     Allergies   Patient has no known allergies.   Review of Systems Review of Systems   Physical Exam Triage Vital Signs ED Triage Vitals  Enc Vitals Group     BP 03/24/17 1541 (!) 198/108     Pulse Rate 03/24/17 1541 (!) 102     Resp 03/24/17 1541 18     Temp 03/24/17 1541 98.3 F (36.8 C)     Temp Source 03/24/17 1541 Oral     SpO2 03/24/17 1541 100 %     Weight --      Height --      Head Circumference --      Peak Flow --      Pain  Score 03/24/17 1542 10     Pain Loc --      Pain Edu? --      Excl. in GC? --    No data found.   Updated Vital Signs BP (!) 198/108 (BP Location: Right Arm)   Pulse (!) 102   Temp 98.3 F (36.8 C) (Oral)   Resp 18   SpO2 100%   Visual Acuity Right Eye Distance:   Left Eye Distance:   Bilateral Distance:    Right Eye Near:   Left Eye Near:    Bilateral Near:     Physical Exam  Constitutional: He is oriented to person, place, and time. He appears well-developed and well-nourished. No distress.  Eyes: Pupils are equal, round, and reactive to light. Conjunctivae and EOM are normal.  Cardiovascular: Normal rate, regular rhythm and normal heart sounds.   Pulses:      Dorsalis pedis pulses are 2+ on the right side, and 2+ on the left side.  Pulmonary/Chest: Effort normal and breath sounds normal. No respiratory  distress. He exhibits no tenderness.  Abdominal: Soft. He exhibits no distension. There is no tenderness.  Musculoskeletal:       Lumbar back: He exhibits tenderness, pain and spasm. He exhibits normal range of motion, no bony tenderness and no swelling.       Back:  Pain exacerbated with forward flexion and with torso twisting; reproducible right musculature pain with palpation; no pain with straight leg raise or toe or heel ambulation ; leg strength 5/5 bilaterally   Neurological: He is alert and oriented to person, place, and time. He has normal reflexes. He displays normal reflexes. No sensory deficit. He exhibits normal muscle tone. Coordination normal.  Skin: Skin is warm and dry. No rash noted.  Vitals reviewed.    UC Treatments / Results  Labs (all labs ordered are listed, but only abnormal results are displayed) Labs Reviewed - No data to display  EKG  EKG Interpretation None       Radiology No results found.  Procedures Procedures (including critical care time)  Medications Ordered in UC Medications  ketorolac (TORADOL) injection 60 mg (60 mg Intramuscular Given 03/24/17 1616)     Initial Impression / Assessment and Plan / UC Course  I have reviewed the triage vital signs and the nursing notes.  Pertinent labs & imaging results that were available during my care of the patient were reviewed by me and considered in my medical decision making (see chart for details).     Back pain consistent with muscular strain. Light activity as tolerated. Continue with NSAID, take with food. Muscle relaxer at night as needed, drowsiness precautions provided. Restarted amlodipine for patient and encouraged use with recheck of BP and follow up within 1 week. Community [facility] wellness center information provided as patient states he does not have insurance. If symptoms worsen or do not improve in the next week to return to be seen or to follow up with PCP. Patient verbalized  understanding and agreeable to plan. Ambulatory out of clinic without difficulty.    Georgetta HaberNatalie B Bulah Lurie, NP 03/24/2017 4:32 PM   Final Clinical Impressions(s) / UC Diagnoses   Final diagnoses:  Strain of lumbar region, initial encounter    New Prescriptions Discharge Medication List as of 03/24/2017  4:15 PM    START taking these medications   Details  methocarbamol (ROBAXIN) 500 MG tablet Take 1 tablet (500 mg total) by mouth every 8 (eight) hours as  needed for muscle spasms. May cause drowsiness, Starting Mon 03/24/2017, Normal         Controlled Substance Prescriptions [place] Controlled Substance Registry consulted? Not Applicable   Georgetta Haber, NP 03/24/17 [phone]

## 2017-03-24 NOTE — ED Triage Notes (Addendum)
Pt here for lower back pain worse on right side after carrying his son last week; pt has not had htn meds in months

## 2017-03-24 NOTE — Discharge Instructions (Signed)
Take Naproxen 500mg  twice a day, take with food. May use heat during the day, I recommend ice at night. Use of muscle relaxer at night. Take blood pressure medication daily. Please have your BP rechecked in 1 week. Please establish with a primary care provider for recheck and continued management.

## 2017-05-02 ENCOUNTER — Other Ambulatory Visit: Payer: Self-pay

## 2017-05-02 ENCOUNTER — Emergency Department (HOSPITAL_COMMUNITY)
Admission: EM | Admit: 2017-05-02 | Discharge: 2017-05-02 | Disposition: A | Discharge status: Home / Self Care | Payer: Self-pay | Attending: Emergency Medicine | Admitting: Emergency Medicine

## 2017-05-02 ENCOUNTER — Encounter (HOSPITAL_COMMUNITY): Payer: Self-pay

## 2017-05-02 DIAGNOSIS — L0231 Cutaneous abscess of buttock: Secondary | ICD-10-CM | POA: Insufficient documentation

## 2017-05-02 DIAGNOSIS — I10 Essential (primary) hypertension: Secondary | ICD-10-CM | POA: Insufficient documentation

## 2017-05-02 DIAGNOSIS — L0291 Cutaneous abscess, unspecified: Secondary | ICD-10-CM

## 2017-05-02 DIAGNOSIS — F17210 Nicotine dependence, cigarettes, uncomplicated: Secondary | ICD-10-CM | POA: Insufficient documentation

## 2017-05-02 DIAGNOSIS — Z79899 Other long term (current) drug therapy: Secondary | ICD-10-CM | POA: Insufficient documentation

## 2017-05-02 DIAGNOSIS — L03317 Cellulitis of buttock: Secondary | ICD-10-CM | POA: Insufficient documentation

## 2017-05-02 MED ORDER — LIDOCAINE-EPINEPHRINE 2 %-1:100000 IJ SOLN
20.0000 mL | Freq: Once | INTRAMUSCULAR | Status: AC
  Administered 2017-05-02: 20 mL via INTRADERMAL
  Filled 2017-05-02: qty 20

## 2017-05-02 MED ORDER — SULFAMETHOXAZOLE-TRIMETHOPRIM 800-160 MG PO TABS: 1.0000 | ORAL_TABLET | Freq: Two times a day (BID) | ORAL | 0 refills | Status: AC

## 2017-05-02 NOTE — ED Provider Notes (Signed)
[facility]-EMERGENCY DEPT Provider Note   CSN: [id number] Arrival date & time: 05/02/17  [id number]     History   Chief Complaint Chief Complaint  Patient presents with  . Abscess    HPI Derrick Clayton is a 36 y.o. male.  HPI  Mr. Derrick Clayton is a 36 year old male with a history of hypertension, tobacco use who presents to the emergency department for evaluation of left buttocks abscess.  He states that he has had abscesses in the axilla in the past, this is his first on the buttocks.  Abscess is located on the left buttocks and began about 4 days ago. It has gradually worsened in pain and size.  Pain is severe, worsened when he sits.  He denies fever, nausea/vomiting, painful passing of stool.  Reports that he was able to express some purulent drainage about 2 days ago.  Past Medical History:  Diagnosis Date  . Hypertension     Patient Active Problem List   Diagnosis Date Noted  . TOBACCO DEPENDENCE 07/24/2006  . HYPERTENSION, BENIGN SYSTEMIC 07/24/2006    Past Surgical History:  Procedure Laterality Date  . FOOT SURGERY         Home Medications    Prior to Admission medications   Medication Sig Start Date End Date Taking? Authorizing Provider  acetaminophen (TYLENOL) 500 MG tablet Take 500 mg by mouth every 6 (six) hours as needed (pain).    [provider]  acyclovir (ZOVIRAX) 800 MG tablet Take 0.5 tablets (400 mg total) by mouth 5 (five) times daily. 11/16/13   Palumbo, April, MD  amLODipine (NORVASC) 5 MG tablet Take 1 tablet (5 mg total) by mouth daily. 03/24/17   Georgetta HaberBurky, Natalie B, NP  lisinopril (PRINIVIL,ZESTRIL) 10 MG tablet Take 10 mg by mouth daily.    [provider]  methocarbamol (ROBAXIN) 500 MG tablet Take 1 tablet (500 mg total) by mouth every 8 (eight) hours as needed for muscle spasms. May cause drowsiness 03/24/17   Linus MakoBurky, Natalie B, NP  Metoprolol Tartrate (LOPRESSOR PO) Take by mouth.    [provider]     Family History Family History  Problem Relation Age of Onset  . Cancer Mother   . Heart failure Father     Social History Social History   Tobacco Use  . Smoking status: Current Every Day Smoker    Packs/day: 0.00    Types: Cigarettes  . Smokeless tobacco: Never Used  Substance Use Topics  . Alcohol use: Yes    Comment: 3-4 days a week  . Drug use: No     Allergies   Patient has no known allergies.   Review of Systems Review of Systems  Constitutional: Negative for chills, fatigue and fever.  Gastrointestinal: Negative for abdominal pain, anal bleeding, blood in stool, nausea, rectal pain and vomiting.  Musculoskeletal: Positive for myalgias (left buttocks pain).  Skin: Positive for color change (erythematous left buttocks). Negative for wound.     Physical Exam Updated Vital Signs BP (!) 172/108 Comment: Just took BP medication   Pulse (!) 104   Temp 98.1 F (36.7 C) (Oral)   Resp 15   Ht 6\' 1"  (1.854 m)   Wt 101.6 kg (224 lb)   SpO2 100%   BMI 29.55 kg/m   Physical Exam  Constitutional: He appears well-developed and well-nourished. No distress.  HENT:  Head: Normocephalic and atraumatic.  Eyes: Right eye exhibits no discharge. Left eye exhibits no discharge.  Cardiovascular: Normal rate  and regular rhythm. Exam reveals no friction rub.  No murmur heard. Pulmonary/Chest: Effort normal and breath sounds normal. No stridor. No respiratory distress. He has no wheezes. He has no rales.  Genitourinary:  Genitourinary Comments: Chaperone present for exam. No gross blood noted on rectal exam, normal tone, no tenderness, no mass or fissure, no hemorrhoids noted.  Musculoskeletal:  Left buttocks with approximately 3 cm area of erythema and induration with fluctuance in the center.  No active drainage.  Tender to the touch.  Does not track towards the rectum.  Neurological: He is alert. Coordination normal.  Skin: Skin is warm and dry. Capillary refill takes  less than 2 seconds. He is not diaphoretic.  Psychiatric: He has a normal mood and affect. His behavior is normal.  Nursing note and vitals reviewed.   ED Treatments / Results  Labs (all labs ordered are listed, but only abnormal results are displayed) Labs Reviewed - No data to display  EKG  EKG Interpretation None       Radiology No results found.  Procedures EMERGENCY DEPARTMENT US SOFT TISSUE INTERPRETATION "Study: Limited Soft Tissue Ultrasound"  INDICATIONS: Soft tissue infection Multiple views of the body part were obtained in real-time with a multi-frequency linear probe  PERFORMED BY: Myself IMAGES ARCHIVED?: Yes SIDE:Left BODY PART:buttocks INTERPRETATION:  Abcess present and Cellulitis present    .Marland KitchenIncision and Drainage Date/Time: 05/02/2017 12:04 PM Performed by: Kellie Shropshire, PA-C Authorized by: Kellie Shropshire, PA-C   Consent:    Consent obtained:  Verbal and emergent situation   Consent given by:  Patient   Risks discussed:  Incomplete drainage, infection, bleeding and pain   Alternatives discussed:  No treatment Location:    Type:  Abscess   Location: left buttocks. Pre-procedure details:    Skin preparation:  Betadine Anesthesia (see MAR for exact dosages):    Anesthesia method:  Local infiltration   Local anesthetic:  Lidocaine 2% WITH epi Procedure type:    Complexity:  Simple Procedure details:    Incision types:  Single straight   Scalpel blade:  11   Wound management:  Probed and deloculated and irrigated with saline   Drainage:  Purulent   Drainage amount:  Copious   Wound treatment:  Wound left open   Packing materials:  None Post-procedure details:    Patient tolerance of procedure:  Tolerated well, no immediate complications   (including critical care time)  Medications Ordered in ED Medications - No data to display  Initial Impression / Assessment and Plan / ED Course  I have reviewed the triage vital signs and  the nursing notes.  Pertinent labs & imaging results that were available during my care of the patient were reviewed by me and considered in my medical decision making (see chart for details).     Patient with skin abscess amenable to incision and drainage.  He is afebrile, nontoxic-appearing and in no acute distress.  No painful bowel movements or tenderness with rectal exam to suggest perianal abscess.  Abscess was not large enough to warrant packing or drain,  wound recheck in 2 days. Encouraged home warm soaks and flushing.  Moderate signs of cellulitis is surrounding skin, will d/c home with antibiotic.  Return precautions discussed and patient agrees and voiced understanding.  His blood pressure was also mildly elevated in the ER today, counseled him to have this rechecked patient agrees.  He has no complaints prior to discharge.  Final Clinical Impressions(s) / ED Diagnoses  Final diagnoses:  Abscess    ED Discharge Orders    None       Lawrence MarseillesShrosbree, Jinan Biggins J, PA-C 05/02/17 1209    Charlynne PanderYao, David Hsienta, MD 05/03/17 [phone]

## 2017-05-02 NOTE — Discharge Instructions (Signed)
Please gently soak in warm soapy water starting tomorrow.   Please take antibiotic twice a day for the next 7 days.  Return to urgent care or ER for infection recheck in 2 days.  Your blood pressure was elevated in the ER today, please have this rechecked at your primary care office.  Return to the emergency department if you have worsening pain with fever greater than 100.4 degrees, vomiting that will not stop or have any new or worsening symptoms.

## 2017-05-02 NOTE — ED Notes (Signed)
ED Provider at bedside performing I&D  

## 2017-05-02 NOTE — ED Triage Notes (Signed)
Patient c/o left buttock abscess x 4 days.

## 2017-05-02 NOTE — ED Notes (Signed)
Abscess area cleansed and dressed. Patient given extra guaze and verbalized understanding of homecare instructions and instructions on antibiotics and importance of completion.

## 2018-03-10 ENCOUNTER — Inpatient Hospital Stay (HOSPITAL_COMMUNITY)
Admission: EM | Admit: 2018-03-10 | Discharge: 2018-03-18 | DRG: 220 | Disposition: A | Discharge status: Home / Self Care | Payer: Self-pay | Attending: Thoracic Surgery (Cardiothoracic Vascular Surgery) | Admitting: Thoracic Surgery (Cardiothoracic Vascular Surgery)

## 2018-03-10 ENCOUNTER — Emergency Department (HOSPITAL_COMMUNITY): Payer: Self-pay

## 2018-03-10 ENCOUNTER — Encounter (HOSPITAL_COMMUNITY)
Admission: EM | Disposition: A | Discharge status: Home / Self Care | Payer: Self-pay | Source: Home / Self Care | Attending: Thoracic Surgery (Cardiothoracic Vascular Surgery)

## 2018-03-10 ENCOUNTER — Other Ambulatory Visit: Payer: Self-pay

## 2018-03-10 ENCOUNTER — Emergency Department (HOSPITAL_COMMUNITY): Payer: Self-pay | Admitting: Anesthesiology

## 2018-03-10 ENCOUNTER — Encounter (HOSPITAL_COMMUNITY): Payer: Self-pay | Admitting: Emergency Medicine

## 2018-03-10 DIAGNOSIS — R079 Chest pain, unspecified: Secondary | ICD-10-CM

## 2018-03-10 DIAGNOSIS — Y92009 Unspecified place in unspecified non-institutional (private) residence as the place of occurrence of the external cause: Secondary | ICD-10-CM

## 2018-03-10 DIAGNOSIS — Z9114 Patient's other noncompliance with medication regimen: Secondary | ICD-10-CM

## 2018-03-10 DIAGNOSIS — T461X6A Underdosing of calcium-channel blockers, initial encounter: Secondary | ICD-10-CM | POA: Diagnosis present

## 2018-03-10 DIAGNOSIS — Z9889 Other specified postprocedural states: Secondary | ICD-10-CM

## 2018-03-10 DIAGNOSIS — F17210 Nicotine dependence, cigarettes, uncomplicated: Secondary | ICD-10-CM | POA: Diagnosis present

## 2018-03-10 DIAGNOSIS — D689 Coagulation defect, unspecified: Secondary | ICD-10-CM | POA: Diagnosis not present

## 2018-03-10 DIAGNOSIS — D62 Acute posthemorrhagic anemia: Secondary | ICD-10-CM | POA: Diagnosis not present

## 2018-03-10 DIAGNOSIS — N179 Acute kidney failure, unspecified: Secondary | ICD-10-CM | POA: Diagnosis present

## 2018-03-10 DIAGNOSIS — I480 Paroxysmal atrial fibrillation: Secondary | ICD-10-CM | POA: Diagnosis not present

## 2018-03-10 DIAGNOSIS — J811 Chronic pulmonary edema: Secondary | ICD-10-CM | POA: Diagnosis present

## 2018-03-10 DIAGNOSIS — I7101 Dissection of thoracic aorta: Secondary | ICD-10-CM

## 2018-03-10 DIAGNOSIS — I71019 Dissection of thoracic aorta, unspecified: Secondary | ICD-10-CM

## 2018-03-10 DIAGNOSIS — J90 Pleural effusion, not elsewhere classified: Secondary | ICD-10-CM | POA: Diagnosis not present

## 2018-03-10 DIAGNOSIS — Z8249 Family history of ischemic heart disease and other diseases of the circulatory system: Secondary | ICD-10-CM

## 2018-03-10 DIAGNOSIS — I7103 Dissection of thoracoabdominal aorta: Principal | ICD-10-CM | POA: Diagnosis present

## 2018-03-10 DIAGNOSIS — D696 Thrombocytopenia, unspecified: Secondary | ICD-10-CM | POA: Diagnosis present

## 2018-03-10 DIAGNOSIS — I484 Atypical atrial flutter: Secondary | ICD-10-CM | POA: Diagnosis not present

## 2018-03-10 DIAGNOSIS — Z79899 Other long term (current) drug therapy: Secondary | ICD-10-CM

## 2018-03-10 DIAGNOSIS — I471 Supraventricular tachycardia: Secondary | ICD-10-CM | POA: Diagnosis not present

## 2018-03-10 DIAGNOSIS — E876 Hypokalemia: Secondary | ICD-10-CM

## 2018-03-10 DIAGNOSIS — Z91128 Patient's intentional underdosing of medication regimen for other reason: Secondary | ICD-10-CM

## 2018-03-10 DIAGNOSIS — I714 Abdominal aortic aneurysm, without rupture, unspecified: Secondary | ICD-10-CM | POA: Diagnosis present

## 2018-03-10 DIAGNOSIS — J9811 Atelectasis: Secondary | ICD-10-CM | POA: Diagnosis not present

## 2018-03-10 DIAGNOSIS — G479 Sleep disorder, unspecified: Secondary | ICD-10-CM | POA: Diagnosis not present

## 2018-03-10 DIAGNOSIS — I10 Essential (primary) hypertension: Secondary | ICD-10-CM | POA: Diagnosis present

## 2018-03-10 DIAGNOSIS — I161 Hypertensive emergency: Secondary | ICD-10-CM

## 2018-03-10 DIAGNOSIS — E875 Hyperkalemia: Secondary | ICD-10-CM | POA: Diagnosis not present

## 2018-03-10 DIAGNOSIS — E8770 Fluid overload, unspecified: Secondary | ICD-10-CM | POA: Diagnosis not present

## 2018-03-10 DIAGNOSIS — Z95828 Presence of other vascular implants and grafts: Secondary | ICD-10-CM

## 2018-03-10 HISTORY — DX: Dissection of thoracic aorta: I71.01

## 2018-03-10 HISTORY — DX: Other specified postprocedural states: Z98.890

## 2018-03-10 HISTORY — PX: AORTIC INTERVENTION: CATH118225

## 2018-03-10 HISTORY — PX: AORTIC VALVE REPAIR: SHX6306

## 2018-03-10 HISTORY — DX: Dissection of thoracic aorta, unspecified: I71.019

## 2018-03-10 LAB — HEPATIC FUNCTION PANEL
ALT: 40 U/L (ref 0–44)
AST: 36 U/L (ref 15–41)
Albumin: 3.9 g/dL (ref 3.5–5.0)
Alkaline Phosphatase: 53 U/L (ref 38–126)
BILIRUBIN TOTAL: 0.6 mg/dL (ref 0.3–1.2)
Total Protein: 6.9 g/dL (ref 6.5–8.1)

## 2018-03-10 LAB — ABO/RH: ABO/RH(D): B POS

## 2018-03-10 LAB — BASIC METABOLIC PANEL
Anion gap: 20 — ABNORMAL HIGH (ref 5–15)
BUN: 13 mg/dL (ref 6–20)
CHLORIDE: 100 mmol/L (ref 98–111)
CO2: 15 mmol/L — ABNORMAL LOW (ref 22–32)
Calcium: 9.3 mg/dL (ref 8.9–10.3)
Creatinine, Ser: 1.81 mg/dL — ABNORMAL HIGH (ref 0.61–1.24)
GFR calc non Af Amer: 46 mL/min — ABNORMAL LOW (ref >60)
GFR, EST AFRICAN AMERICAN: 53 mL/min — AB (ref >60)
Glucose, Bld: 198 mg/dL — ABNORMAL HIGH (ref 70–99)
POTASSIUM: 2.6 mmol/L — AB (ref 3.5–5.1)
SODIUM: 135 mmol/L (ref 135–145)

## 2018-03-10 LAB — CBC
HEMATOCRIT: 42.6 % (ref 39.0–52.0)
Hemoglobin: 14.2 g/dL (ref 13.0–17.0)
MCH: 30.2 pg (ref 26.0–34.0)
MCHC: 33.3 g/dL (ref 30.0–36.0)
MCV: 90.6 fL (ref 80.0–100.0)
Platelets: 269 10*3/uL (ref 150–400)
RBC: 4.7 MIL/uL (ref 4.22–5.81)
RDW: 12.6 % (ref 11.5–15.5)
WBC: 9.5 10*3/uL (ref 4.0–10.5)
nRBC: 0 % (ref 0.0–0.2)

## 2018-03-10 LAB — I-STAT CHEM 8, ED
BUN: 14 mg/dL (ref 6–20)
CALCIUM ION: 1.08 mmol/L — AB (ref 1.15–1.40)
CREATININE: 1.7 mg/dL — AB (ref 0.61–1.24)
Chloride: 100 mmol/L (ref 98–111)
GLUCOSE: 195 mg/dL — AB (ref 70–99)
HCT: 44 % (ref 39.0–52.0)
HEMOGLOBIN: 15 g/dL (ref 13.0–17.0)
Potassium: 2.8 mmol/L — ABNORMAL LOW (ref 3.5–5.1)
Sodium: 136 mmol/L (ref 135–145)
TCO2: 18 mmol/L — AB (ref 22–32)

## 2018-03-10 LAB — PREPARE RBC (CROSSMATCH)

## 2018-03-10 LAB — I-STAT TROPONIN, ED: Troponin i, poc: 0.01 ng/mL (ref 0.00–0.08)

## 2018-03-10 LAB — MAGNESIUM: Magnesium: 2 mg/dL (ref 1.7–2.4)

## 2018-03-10 LAB — LIPASE, BLOOD: LIPASE: 40 U/L (ref 11–51)

## 2018-03-10 SURGERY — REPAIR, AORTIC VALVE
Anesthesia: General | Site: Chest

## 2018-03-10 MED ORDER — DOPAMINE-DEXTROSE 3.2-5 MG/ML-% IV SOLN
0.0000 ug/kg/min | INTRAVENOUS | Status: DC
  Filled 2018-03-10: qty 250

## 2018-03-10 MED ORDER — FENTANYL CITRATE (PF) 100 MCG/2ML IJ SOLN
50.0000 ug | Freq: Once | INTRAMUSCULAR | Status: AC
Start: 2018-03-10 — End: 2018-03-10
  Administered 2018-03-10: 50 ug via INTRAVENOUS
  Filled 2018-03-10: qty 2

## 2018-03-10 MED ORDER — ROCURONIUM BROMIDE 10 MG/ML (PF) SYRINGE
PREFILLED_SYRINGE | INTRAVENOUS | Status: DC | PRN
  Administered 2018-03-10 – 2018-03-11 (×3): 50 mg via INTRAVENOUS

## 2018-03-10 MED ORDER — VANCOMYCIN HCL 1000 MG IV SOLR
INTRAVENOUS | Status: DC
  Filled 2018-03-10: qty 1000

## 2018-03-10 MED ORDER — ROCURONIUM BROMIDE 50 MG/5ML IV SOSY
PREFILLED_SYRINGE | INTRAVENOUS | Status: AC
  Filled 2018-03-10: qty 5

## 2018-03-10 MED ORDER — ESMOLOL HCL-SODIUM CHLORIDE 2000 MG/100ML IV SOLN
25.0000 ug/kg/min | Freq: Once | INTRAVENOUS | Status: DC
  Filled 2018-03-10: qty 100

## 2018-03-10 MED ORDER — PLASMA-LYTE 148 IV SOLN
INTRAVENOUS | Status: DC
  Filled 2018-03-10: qty 2.5

## 2018-03-10 MED ORDER — TRANEXAMIC ACID (OHS) BOLUS VIA INFUSION
15.0000 mg/kg | INTRAVENOUS | Status: AC
  Administered 2018-03-10: 1524 mg via INTRAVENOUS
  Filled 2018-03-10: qty 1524

## 2018-03-10 MED ORDER — MILRINONE LACTATE IN DEXTROSE 20-5 MG/100ML-% IV SOLN
0.3000 ug/kg/min | INTRAVENOUS | Status: DC
  Filled 2018-03-10 (×2): qty 100

## 2018-03-10 MED ORDER — IOPAMIDOL (ISOVUE-370) INJECTION 76%
INTRAVENOUS | Status: AC
  Administered 2018-03-10: 19:00:00
  Filled 2018-03-10: qty 100

## 2018-03-10 MED ORDER — SUCCINYLCHOLINE CHLORIDE 200 MG/10ML IV SOSY
PREFILLED_SYRINGE | INTRAVENOUS | Status: AC
  Filled 2018-03-10: qty 20

## 2018-03-10 MED ORDER — KENNESTONE BLOOD CARDIOPLEGIA VIAL
13.0000 mL | Freq: Once | Status: DC
  Filled 2018-03-10: qty 1

## 2018-03-10 MED ORDER — SODIUM CHLORIDE 0.9 % IR SOLN
Status: DC | PRN
  Administered 2018-03-10: 6000 mL

## 2018-03-10 MED ORDER — ETOMIDATE 2 MG/ML IV SOLN
INTRAVENOUS | Status: DC | PRN
  Administered 2018-03-10: 14 mg via INTRAVENOUS
  Administered 2018-03-10: 20 mg via INTRAVENOUS

## 2018-03-10 MED ORDER — MAGNESIUM SULFATE 50 % IJ SOLN
40.0000 meq | INTRAMUSCULAR | Status: DC
  Filled 2018-03-10: qty 9.85

## 2018-03-10 MED ORDER — EPINEPHRINE PF 1 MG/ML IJ SOLN
0.0000 ug/min | INTRAVENOUS | Status: DC
  Filled 2018-03-10: qty 4

## 2018-03-10 MED ORDER — SUCCINYLCHOLINE CHLORIDE 200 MG/10ML IV SOSY
PREFILLED_SYRINGE | INTRAVENOUS | Status: DC | PRN
  Administered 2018-03-10: 120 mg via INTRAVENOUS

## 2018-03-10 MED ORDER — PLASMA-LYTE 148 IV SOLN
INTRAVENOUS | Status: DC | PRN
  Administered 2018-03-10: 22:00:00 via INTRAVASCULAR

## 2018-03-10 MED ORDER — SODIUM CHLORIDE 0.9 % IV SOLN
INTRAVENOUS | Status: DC
  Filled 2018-03-10: qty 30

## 2018-03-10 MED ORDER — IOPAMIDOL (ISOVUE-370) INJECTION 76%
100.0000 mL | Freq: Once | INTRAVENOUS | Status: AC | PRN
  Administered 2018-03-10: 100 mL via INTRAVENOUS

## 2018-03-10 MED ORDER — POTASSIUM CHLORIDE 2 MEQ/ML IV SOLN
80.0000 meq | INTRAVENOUS | Status: DC
  Filled 2018-03-10: qty 40

## 2018-03-10 MED ORDER — MIDAZOLAM HCL 5 MG/5ML IJ SOLN
INTRAMUSCULAR | Status: DC | PRN
  Administered 2018-03-10 (×2): 3 mg via INTRAVENOUS
  Administered 2018-03-10: 4 mg via INTRAVENOUS
  Administered 2018-03-11 (×2): 2 mg via INTRAVENOUS

## 2018-03-10 MED ORDER — HYDROCORTISONE NA SUCCINATE PF 100 MG IJ SOLR
INTRAMUSCULAR | Status: DC | PRN
  Administered 2018-03-10: 250 mg via INTRAVENOUS

## 2018-03-10 MED ORDER — DEXMEDETOMIDINE HCL IN NACL 400 MCG/100ML IV SOLN
0.1000 ug/kg/h | INTRAVENOUS | Status: AC
  Administered 2018-03-10: .2 ug/kg/h via INTRAVENOUS
  Filled 2018-03-10 (×2): qty 100

## 2018-03-10 MED ORDER — FENTANYL CITRATE (PF) 250 MCG/5ML IJ SOLN
INTRAMUSCULAR | Status: DC | PRN
  Administered 2018-03-10 (×2): 150 ug via INTRAVENOUS
  Administered 2018-03-10: 250 ug via INTRAVENOUS
  Administered 2018-03-10: 100 ug via INTRAVENOUS
  Administered 2018-03-10: 250 ug via INTRAVENOUS
  Administered 2018-03-10 (×2): 50 ug via INTRAVENOUS
  Administered 2018-03-11: 150 ug via INTRAVENOUS
  Administered 2018-03-11: 100 ug via INTRAVENOUS

## 2018-03-10 MED ORDER — KENNESTONE BLOOD CARDIOPLEGIA (KBC) MANNITOL SYRINGE (20%, 32ML)
32.0000 mL | Freq: Once | INTRAVENOUS | Status: DC
  Filled 2018-03-10: qty 1

## 2018-03-10 MED ORDER — LACTATED RINGERS IV SOLN
INTRAVENOUS | Status: DC | PRN
  Administered 2018-03-10 – 2018-03-11 (×2): via INTRAVENOUS

## 2018-03-10 MED ORDER — LACTATED RINGERS IV SOLN
INTRAVENOUS | Status: DC | PRN
  Administered 2018-03-10: 21:00:00 via INTRAVENOUS

## 2018-03-10 MED ORDER — VANCOMYCIN HCL 1000 MG IV SOLR
INTRAVENOUS | Status: DC | PRN
  Administered 2018-03-10: 23:00:00

## 2018-03-10 MED ORDER — PHENYLEPHRINE 40 MCG/ML (10ML) SYRINGE FOR IV PUSH (FOR BLOOD PRESSURE SUPPORT)
PREFILLED_SYRINGE | INTRAVENOUS | Status: AC
  Filled 2018-03-10: qty 10

## 2018-03-10 MED ORDER — SODIUM CHLORIDE 0.9 % IJ SOLN
INTRAMUSCULAR | Status: AC
  Filled 2018-03-10: qty 10

## 2018-03-10 MED ORDER — HEPARIN SODIUM (PORCINE) 1000 UNIT/ML IJ SOLN
INTRAMUSCULAR | Status: DC | PRN
  Administered 2018-03-10: 37000 [IU] via INTRAVENOUS

## 2018-03-10 MED ORDER — DEXMEDETOMIDINE HCL IN NACL 400 MCG/100ML IV SOLN
0.1000 ug/kg/h | INTRAVENOUS | Status: DC
  Filled 2018-03-10: qty 100

## 2018-03-10 MED ORDER — TRANEXAMIC ACID 1000 MG/10ML IV SOLN
1.5000 mg/kg/h | INTRAVENOUS | Status: DC
  Filled 2018-03-10: qty 25

## 2018-03-10 MED ORDER — FENTANYL CITRATE (PF) 250 MCG/5ML IJ SOLN
INTRAMUSCULAR | Status: AC
  Filled 2018-03-10: qty 20

## 2018-03-10 MED ORDER — INSULIN REGULAR(HUMAN) IN NACL 100-0.9 UT/100ML-% IV SOLN
INTRAVENOUS | Status: AC
  Administered 2018-03-10: 1.4 [IU]/h via INTRAVENOUS
  Filled 2018-03-10: qty 100

## 2018-03-10 MED ORDER — FENTANYL CITRATE (PF) 250 MCG/5ML IJ SOLN
INTRAMUSCULAR | Status: AC
  Filled 2018-03-10: qty 5

## 2018-03-10 MED ORDER — HYDROCORTISONE NA SUCCINATE PF 250 MG IJ SOLR
INTRAMUSCULAR | Status: AC
  Filled 2018-03-10: qty 500

## 2018-03-10 MED ORDER — PROPOFOL 10 MG/ML IV BOLUS
INTRAVENOUS | Status: AC
  Filled 2018-03-10: qty 20

## 2018-03-10 MED ORDER — VECURONIUM BROMIDE 10 MG IV SOLR
INTRAVENOUS | Status: AC
  Filled 2018-03-10: qty 10

## 2018-03-10 MED ORDER — SODIUM CHLORIDE 0.9 % IJ SOLN
OROMUCOSAL | Status: DC | PRN
  Administered 2018-03-10 – 2018-03-11 (×4): via TOPICAL

## 2018-03-10 MED ORDER — LIDOCAINE 2% (20 MG/ML) 5 ML SYRINGE
INTRAMUSCULAR | Status: AC
  Filled 2018-03-10: qty 10

## 2018-03-10 MED ORDER — MIDAZOLAM HCL 10 MG/2ML IJ SOLN
INTRAMUSCULAR | Status: AC
  Filled 2018-03-10: qty 2

## 2018-03-10 MED ORDER — TRANEXAMIC ACID 1000 MG/10ML IV SOLN
1.5000 mg/kg/h | INTRAVENOUS | Status: AC
  Administered 2018-03-10: 1.5 mg/kg/h via INTRAVENOUS
  Filled 2018-03-10: qty 25

## 2018-03-10 MED ORDER — LACTATED RINGERS IV SOLN
INTRAVENOUS | Status: DC | PRN
  Administered 2018-03-10: 20:00:00 via INTRAVENOUS

## 2018-03-10 MED ORDER — ARTIFICIAL TEARS OPHTHALMIC OINT
TOPICAL_OINTMENT | OPHTHALMIC | Status: DC | PRN
  Administered 2018-03-10: 1 via OPHTHALMIC

## 2018-03-10 MED ORDER — TRANEXAMIC ACID (OHS) PUMP PRIME SOLUTION
2.0000 mg/kg | INTRAVENOUS | Status: DC
  Filled 2018-03-10: qty 2.03

## 2018-03-10 MED ORDER — NITROGLYCERIN IN D5W 200-5 MCG/ML-% IV SOLN
2.0000 ug/min | INTRAVENOUS | Status: AC
  Administered 2018-03-10: 10 ug/min via INTRAVENOUS
  Filled 2018-03-10: qty 250

## 2018-03-10 MED ORDER — NOREPINEPHRINE 4 MG/250ML-% IV SOLN
0.0000 ug/min | INTRAVENOUS | Status: DC
  Filled 2018-03-10: qty 250

## 2018-03-10 MED ORDER — PHENYLEPHRINE HCL-NACL 20-0.9 MG/250ML-% IV SOLN
30.0000 ug/min | INTRAVENOUS | Status: DC
  Filled 2018-03-10 (×2): qty 250

## 2018-03-10 MED ORDER — SODIUM CHLORIDE 0.9% IV SOLUTION: Freq: Once | INTRAVENOUS | Status: DC

## 2018-03-10 MED ORDER — ESMOLOL HCL 100 MG/10ML IV SOLN
INTRAVENOUS | Status: AC
  Filled 2018-03-10: qty 20

## 2018-03-10 MED ORDER — VECURONIUM BROMIDE 10 MG IV SOLR
INTRAVENOUS | Status: DC | PRN
  Administered 2018-03-10 – 2018-03-11 (×2): 5 mg via INTRAVENOUS

## 2018-03-10 MED ORDER — SODIUM CHLORIDE 0.9 % IV SOLN
1.5000 g | INTRAVENOUS | Status: AC
  Administered 2018-03-10: 1.5 g via INTRAVENOUS
  Filled 2018-03-10: qty 1.5

## 2018-03-10 MED ORDER — EPHEDRINE 5 MG/ML INJ
INTRAVENOUS | Status: AC
  Filled 2018-03-10: qty 10

## 2018-03-10 MED ORDER — SODIUM CHLORIDE 0.9 % IV SOLN
750.0000 mg | INTRAVENOUS | Status: AC
  Administered 2018-03-11: 750 mg via INTRAVENOUS
  Filled 2018-03-10: qty 750

## 2018-03-10 MED ORDER — VANCOMYCIN HCL 10 G IV SOLR
1500.0000 mg | INTRAVENOUS | Status: AC
  Administered 2018-03-10: 1500 mg via INTRAVENOUS
  Filled 2018-03-10: qty 1500

## 2018-03-10 MED ORDER — ETOMIDATE 2 MG/ML IV SOLN
INTRAVENOUS | Status: AC
  Filled 2018-03-10: qty 20

## 2018-03-10 SURGICAL SUPPLY — 76 items
ADAPTER CARDIO PERF ANTE/RETRO (ADAPTER) ×3 IMPLANT
ADPR PRFSN 84XANTGRD RTRGD (ADAPTER) ×2
BLADE STERNUM SYSTEM 6 (BLADE) ×3 IMPLANT
BLADE SURG 11 STRL SS (BLADE) ×3 IMPLANT
CANISTER SUCT 3000ML PPV (MISCELLANEOUS) ×3 IMPLANT
CANNULA EZ GLIDE AORTIC 21FR (CANNULA) ×3 IMPLANT
CANNULA GUNDRY RCSP 15FR (MISCELLANEOUS) ×3 IMPLANT
CANNULA SOFTFLOW AORTIC 7M21FR (CANNULA) ×3 IMPLANT
CATH CPB KIT OWEN (MISCELLANEOUS) ×3 IMPLANT
CATH HEART VENT LEFT (CATHETERS) ×2 IMPLANT
CONT SPEC 4OZ CLIKSEAL STRL BL (MISCELLANEOUS) ×3 IMPLANT
COVER SURGICAL LIGHT HANDLE (MISCELLANEOUS) ×3 IMPLANT
COVER WAND RF STERILE (DRAPES) ×3 IMPLANT
CRADLE DONUT ADULT HEAD (MISCELLANEOUS) ×3 IMPLANT
DRAIN CHANNEL 32F RND 10.7 FF (WOUND CARE) ×5 IMPLANT
DRAPE BILATERAL SPLIT (DRAPES) ×1 IMPLANT
DRAPE CARDIOVASCULAR INCISE (DRAPES) ×3
DRAPE CV SPLIT W-CLR ANES SCRN (DRAPES) ×1 IMPLANT
DRAPE INCISE IOBAN 66X45 STRL (DRAPES) ×6 IMPLANT
DRAPE SLUSH/WARMER DISC (DRAPES) ×3 IMPLANT
DRAPE SRG 135X102X78XABS (DRAPES) IMPLANT
DRSG AQUACEL AG ADV 3.5X14 (GAUZE/BANDAGES/DRESSINGS) ×1 IMPLANT
DRSG COVADERM 4X14 (GAUZE/BANDAGES/DRESSINGS) ×3 IMPLANT
ELECT REM PT RETURN 9FT ADLT (ELECTROSURGICAL) ×6
ELECTRODE REM PT RTRN 9FT ADLT (ELECTROSURGICAL) ×4 IMPLANT
FELT TEFLON 1X6 (MISCELLANEOUS) ×6 IMPLANT
GAUZE SPONGE 4X4 12PLY STRL (GAUZE/BANDAGES/DRESSINGS) ×6 IMPLANT
GAUZE SPONGE 4X4 12PLY STRL LF (GAUZE/BANDAGES/DRESSINGS) ×1 IMPLANT
GLOVE BIO SURGEON STRL SZ 6.5 (GLOVE) ×10 IMPLANT
GLOVE BIO SURGEON STRL SZ7 (GLOVE) ×2 IMPLANT
GLOVE ORTHO TXT STRL SZ7.5 (GLOVE) ×9 IMPLANT
GOWN STRL REUS W/ TWL LRG LVL3 (GOWN DISPOSABLE) ×8 IMPLANT
GOWN STRL REUS W/TWL LRG LVL3 (GOWN DISPOSABLE) ×12
GRAFT WOVEN D/V 26DX30L (Vascular Products) ×1 IMPLANT
HEMOSTAT POWDER SURGIFOAM 1G (HEMOSTASIS) ×13 IMPLANT
INSERT FOGARTY SM (MISCELLANEOUS) ×1 IMPLANT
INSERT FOGARTY XLG (MISCELLANEOUS) ×4 IMPLANT
KIT BASIN OR (CUSTOM PROCEDURE TRAY) ×3 IMPLANT
KIT DRAINAGE VACCUM ASSIST (KITS) ×1 IMPLANT
KIT SUCTION CATH 14FR (SUCTIONS) ×9 IMPLANT
KIT TURNOVER KIT B (KITS) ×3 IMPLANT
LEAD PACING MYOCARDI (MISCELLANEOUS) ×3 IMPLANT
LINE VENT (MISCELLANEOUS) ×1 IMPLANT
LOOP VESSEL SUPERMAXI WHITE (MISCELLANEOUS) ×1 IMPLANT
NS IRRIG 1000ML POUR BTL (IV SOLUTION) ×15 IMPLANT
PACK E OPEN HEART (SUTURE) ×3 IMPLANT
PACK OPEN HEART (CUSTOM PROCEDURE TRAY) ×3 IMPLANT
PAD ARMBOARD 7.5X6 YLW CONV (MISCELLANEOUS) ×6 IMPLANT
SEALANT SURG COSEAL 8ML (VASCULAR PRODUCTS) ×2 IMPLANT
SENSOR MYOCARDIAL TEMP (MISCELLANEOUS) ×1 IMPLANT
SET CARDIOPLEGIA MPS 5001102 (MISCELLANEOUS) ×1 IMPLANT
SET IRRIG TUBING LAPAROSCOPIC (IRRIGATION / IRRIGATOR) ×3 IMPLANT
SUT BONE WAX W31G (SUTURE) ×3 IMPLANT
SUT ETHIBON 2 0 V 52N 30 (SUTURE) ×2 IMPLANT
SUT ETHIBOND 2 0 SH (SUTURE) IMPLANT
SUT ETHIBOND X763 2 0 SH 1 (SUTURE) ×9 IMPLANT
SUT MNCRL AB 3-0 PS2 18 (SUTURE) ×7 IMPLANT
SUT PDS AB 1 CTX 36 (SUTURE) ×6 IMPLANT
SUT PROLENE 3 0 SH DA (SUTURE) ×4 IMPLANT
SUT PROLENE 3 0 SH1 36 (SUTURE) ×15 IMPLANT
SUT PROLENE 4 0 RB 1 (SUTURE) ×36
SUT PROLENE 4 0 SH DA (SUTURE) ×3 IMPLANT
SUT PROLENE 4-0 RB1 .5 CRCL 36 (SUTURE) ×34 IMPLANT
SUT PROLENE 5 0 C 1 36 (SUTURE) ×2 IMPLANT
SUT SILK  1 MH (SUTURE) ×3
SUT SILK 1 MH (SUTURE) ×2 IMPLANT
SUT VIC AB 3-0 SH 18 (SUTURE) ×2 IMPLANT
SYSTEM SAHARA CHEST DRAIN ATS (WOUND CARE) ×3 IMPLANT
TAPE CLOTH SURG 4X10 WHT LF (GAUZE/BANDAGES/DRESSINGS) ×1 IMPLANT
TAPE PAPER 2X10 WHT MICROPORE (GAUZE/BANDAGES/DRESSINGS) ×1 IMPLANT
TOWEL GREEN STERILE (TOWEL DISPOSABLE) ×3 IMPLANT
TOWEL GREEN STERILE FF (TOWEL DISPOSABLE) ×3 IMPLANT
TRAY FOLEY SLVR 16FR TEMP STAT (SET/KITS/TRAYS/PACK) ×3 IMPLANT
UNDERPAD 30X30 (UNDERPADS AND DIAPERS) ×3 IMPLANT
VENT LEFT HEART 12002 (CATHETERS) ×3
WATER STERILE IRR 1000ML POUR (IV SOLUTION) ×6 IMPLANT

## 2018-03-10 NOTE — ED Provider Notes (Signed)
Medical screening examination/treatment/procedure(s) were conducted as a shared visit with non-physician practitioner(s) and myself.  I personally evaluated the patient during the encounter.  Here with severe chest pain and left leg pain. Initially called as a code STEMI in the field however on review of ECG it wasn't consistent.  On exam is hypertensive with diminished pulses on left.  Concern for dissection, with severe pain anddiminished pulses and symptoms, will CT. Not wait for labs.   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 1928: tried to call Dr. Barry Dienes, answering service could not hear me and hung up.  1929: paged Extender for CT surgery.  1931: Discussed with [facility], will page Dr. Cornelius Moras with info.  1934: BP in trendelenburg is 135 systolic, will give fentanyl.  Discussed with Dr. Kerby Moors [phone], he was in ED around 1950 for evaluation and to take to OR.  BP's around 116 systolic, HR in 70's will start esmolol if stays down here much longer.  To OR.   CRITICAL CARE Performed by: Marily Memos Total critical care time: 35 minutes Critical care time was exclusive of separately billable procedures and treating other patients. Critical care was necessary to treat or prevent imminent or life-threatening deterioration. Critical care was time spent personally by me on the following activities: development of treatment plan with patient and/or surrogate as well as nursing, discussions with consultants, evaluation of patient's response to treatment, examination of patient, obtaining history from patient or surrogate, ordering and performing treatments and interventions, ordering and review of laboratory studies, ordering and review of radiographic studies, pulse oximetry and re-evaluation of patient's condition.    EKG Interpretation  Date/Time:  Tuesday March 10 2018 18:57:00 EDT Ventricular Rate:  77 PR Interval:  154 QRS  Duration: 90 QT Interval:  444 QTC Calculation: 502 R Axis:   77 Text Interpretation:  Normal sinus rhythm Biatrial enlargement Prolonged QT Abnormal ECG No old tracing to compare Confirmed by Dione Booze ([id number]) on 03/10/2018 11:17:37 PM      Janella Rogala, Barbara Cower, MD 03/11/18 [phone]

## 2018-03-10 NOTE — Anesthesia Procedure Notes (Signed)
Arterial Line Insertion Start/End10/15/2019 8:31 PM, 03/10/2018 8:46 PM Performed by: Elliot Dally, CRNA, CRNA  Patient location: OR. Preanesthetic checklist: patient identified, IV checked, site marked, risks and benefits discussed, surgical consent, monitors and equipment checked, pre-op evaluation, timeout performed and anesthesia consent Emergency situation Lidocaine 1% used for infiltration Left, radial was placed Catheter size: 20 G Maximum sterile barriers used   Attempts: 1 Procedure performed without using ultrasound guided technique. Following insertion, dressing applied and Biopatch. Post procedure assessment: normal  Patient tolerated the procedure well with no immediate complications.

## 2018-03-10 NOTE — ED Notes (Signed)
Pt has a tearing type chest pain with pain to his legs.

## 2018-03-10 NOTE — Consult Note (Signed)
[address]       Derrick Clayton [address]             [phone]          CARDIOTHORACIC SURGERY CONSULTATION REPORT  PCP is Patient, No Pcp Per Referring Provider is Mesner, Barbara Cower, MD  Reason for consultation:  Acute Aortic Dissection  HPI:  Patient is a 37 year old African-American male with history of hypertension who developed sudden onset of severe substernal chest pain radiating to the neck and left lower extremity pain this evening.  Pain quickly became extremely severe prompting call to EMS.  The patient was brought directly to the emergency room where he was notably severely hypertensive.  CT angiogram of the chest and abdomen reveals acute type a aortic dissection.  The patient is married and lives locally in [place].  He states that he has been taking medications for blood pressure for several years but he ran out of his medications several days ago.  He has not seen a physician in several years.  He denies any previous history of similar symptoms.  He reports that the pain is extremely severe and radiates to the neck.  He also has severe pain in his left lower leg.  He has mild shortness of breath.  Prior to this evening he was in his usual state of health and without complaints.  Past Medical History:  Diagnosis Date  . Hypertension     Past Surgical History:  Procedure Laterality Date  . FOOT SURGERY      Family History  Problem Relation Age of Onset  . Cancer Mother   . Heart failure Father     Social History   Socioeconomic History  . Marital status: Single    Spouse name: Not on file  . Number of children: Not on file  . Years of education: Not on file  . Highest education level: Not on file  Occupational History  . Not on file  Social Needs  . Financial resource strain: Not on file  . Food insecurity:    Worry: Not on file    Inability: Not on file  . Transportation needs:    Medical: Not on file    Non-medical: Not on  file  Tobacco Use  . Smoking status: Current Every Day Smoker    Packs/day: 0.00    Types: Cigarettes  . Smokeless tobacco: Never Used  Substance and Sexual Activity  . Alcohol use: Yes    Comment: 3-4 days a week  . Drug use: No  . Sexual activity: Not on file  Lifestyle  . Physical activity:    Days per week: Not on file    Minutes per session: Not on file  . Stress: Not on file  Relationships  . Social connections:    Talks on phone: Not on file    Gets together: Not on file    Attends religious service: Not on file    Active member of club or organization: Not on file    Attends meetings of clubs or organizations: Not on file    Relationship status: Not on file  . Intimate partner violence:    Fear of current or ex partner: Not on file    Emotionally abused: Not on file    Physically abused: Not on file    Forced sexual activity: Not on file  Other Topics Concern  . Not on file  Social History Narrative  . Not on  file    Prior to Admission medications   Medication Sig Start Date End Date Taking? Authorizing Provider  acetaminophen (TYLENOL) 500 MG tablet Take 500 mg by mouth every 6 (six) hours as needed (pain).    [provider]  acyclovir (ZOVIRAX) 800 MG tablet Take 0.5 tablets (400 mg total) by mouth 5 (five) times daily. 11/16/13   Palumbo, April, MD  amLODipine (NORVASC) 5 MG tablet Take 1 tablet (5 mg total) by mouth daily. 03/24/17   Georgetta Haber, NP  lisinopril (PRINIVIL,ZESTRIL) 10 MG tablet Take 10 mg by mouth daily.    [provider]  methocarbamol (ROBAXIN) 500 MG tablet Take 1 tablet (500 mg total) by mouth every 8 (eight) hours as needed for muscle spasms. May cause drowsiness 03/24/17   Linus Mako B, NP  Metoprolol Tartrate (LOPRESSOR PO) Take by mouth.    [provider]    No current facility-administered medications for this encounter.    Current Outpatient Medications  Medication Sig Dispense Refill  .  acetaminophen (TYLENOL) 500 MG tablet Take 500 mg by mouth every 6 (six) hours as needed (pain).    Marland Kitchen acyclovir (ZOVIRAX) 800 MG tablet Take 0.5 tablets (400 mg total) by mouth 5 (five) times daily. 50 tablet 0  . amLODipine (NORVASC) 5 MG tablet Take 1 tablet (5 mg total) by mouth daily. 30 tablet 1  . lisinopril (PRINIVIL,ZESTRIL) 10 MG tablet Take 10 mg by mouth daily.    . methocarbamol (ROBAXIN) 500 MG tablet Take 1 tablet (500 mg total) by mouth every 8 (eight) hours as needed for muscle spasms. May cause drowsiness 20 tablet 0  . Metoprolol Tartrate (LOPRESSOR PO) Take by mouth.      No Known Allergies    Review of Systems:  Per HPI.  Remainder non-contributory    Physical Exam:   BP (!) 124/47   Pulse 73   Resp 16   Ht 6\' 1"  (1.854 m)   Wt 101.6 kg   SpO2 92%   BMI 29.55 kg/m   General:  WDWN AA male in obvious distress  HEENT:  Unremarkable   Neck:   no JVD, no bruits, no adenopathy   Chest:   clear to auscultation, symmetrical breath sounds, no wheezes, no rhonchi   CV:   RRR, no murmur   Abdomen:  soft, non-tender, no masses   Extremities:  warm, well-perfused, pulses diminished left arm and left lower leg, no lower extremity edema  Rectal/GU  Deferred  Neuro:   Grossly non-focal and symmetrical throughout  Skin:   Clean and dry, no rashes, no breakdown  Diagnostic Tests:  Lab Results: Recent Labs    03/10/18 1850 03/10/18 1908  WBC 9.5  --   HGB 14.2 15.0  HCT 42.6 44.0  PLT 269  --    BMET:  Recent Labs    03/10/18 1908  NA 136  K 2.8*  CL 100  GLUCOSE 195*  BUN 14  CREATININE 1.70*    CBG (last 3)  No results for input(s): GLUCAP in the last 72 hours. PT/INR:  No results for input(s): LABPROT, INR in the last 72 hours.  CTA:    CT ANGIOGRAPHY CHEST, ABDOMEN AND PELVIS  TECHNIQUE: Multidetector CT imaging through the chest, abdomen and pelvis was performed using the standard protocol during bolus administration of intravenous  contrast. Multiplanar reconstructed images and MIPs were obtained and reviewed to evaluate the vascular anatomy.  CONTRAST:  ISOVUE-370 IOPAMIDOL (ISOVUE-370) INJECTION  76% IV  COMPARISON:  None.  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 No intramural hematoma on precontrast imaging. Following contrast, type a aortic dissection identified extending from the proximal ascending thoracic aorta from adjacent to the aortic valve through the ascending aorta, aortic arch, and descending thoracic aorta into abdomen. Bovine arch anatomy. Dissection extends cranially into the common origin of the brachiocephalic artery/LEFT common carotid artery into the brachiocephalic artery and then into the proximal RIGHT common carotid artery. Only a thin rim of contrast enhancement is seen within the RIGHT common carotid artery, likely native lumen, question nonenhancing lumen versus thrombosis. BILATERAL subclavian and LEFT common carotid arteries are patent and enhancing. Pulmonary arteries patent. No pericardial effusion.  Mediastinum/Nodes: Tiny hiatal hernia. No thoracic adenopathy. Base of cervical region normal appearance. Esophagus normal appearance.  Lungs/Pleura: Lungs clear. No infiltrate, pleural effusion or pneumothorax. Small bleb subpleural anterior LEFT upper lobe adjacent to anterior junction line.  Musculoskeletal: No acute osseous findings.  Review of the MIP images confirms the above findings.  CTA ABDOMEN AND PELVIS FINDINGS  VASCULAR  Aorta: Aortic dissection extends through the descending thoracic aorta through the aortic bifurcation into the common iliac arteries bilaterally. Aorta normal caliber. Scattered atherosclerotic calcifications aorta.  Celiac: Arises at the dissection flap, with marked narrowing of the origin.  SMA: Arises at the dissection flap, with  marked narrowing of the origin.  Renals: RIGHT renal artery is patent and arises from the small true lumen. LEFT renal artery is patent and arises from the larger false lumen. Probable tiny accessory artery to the upper pole the LEFT kidney.  IMA: Patent, arises from the smaller true lumen, narrowed at origin.  Inflow: Scattered atherosclerotic calcifications common iliac arteries. Dissection extends into the RIGHT common iliac artery but not down the external iliac artery; RIGHT internal and external iliac arteries appear patent. Dissection extends down the LEFT common iliac artery into the common iliac bifurcation. Marked narrowing of the proximal LEFT internal iliac artery with a thin patent rim of contrast identified in a diminutive true lumen adjacent to a larger non opacifying false lumen lumen. LEFT external iliac artery is unopacified throughout its course question nonenhancing versus thrombosed and reconstitutes distally at the common femoral level.  Veins: Suboptimally assessed on CTA imaging  Review of the MIP images confirms the above findings.  NON-VASCULAR  Hepatobiliary: Gallbladder and liver normal appearance  Pancreas: Normal appearance  Spleen: Normal appearance  Adrenals/Urinary Tract: Adrenal glands normal appearance. Patchiness of the nephrograms bilaterally especially upper pole LEFT kidney question due to impaired flow in the tiny accessory artery to the upper pole. Mild diffuse patchiness of the RIGHT nephrogram is seen more diffusely suggesting hypoperfusion.  Stomach/Bowel: Fluid-filled bowel loop in RIGHT pelvis question cecum. Small hiatal hernia. Stomach and remaining bowel loops unremarkable.  Lymphatic: No adenopathy.  Reproductive: Unremarkable prostate and seminal vesicles  Other: No free air or free fluid. Tiny umbilical hernia containing fat. No acute inflammatory process.  Musculoskeletal: No acute osseous  findings.  Review of the MIP images confirms the above findings.  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  Dissection extends into the bovine arch, up the brachiocephalic artery and into the RIGHT common carotid artery, which demonstrate only a tiny rim of enhancing native lumen with majority of vessel non-opacified question  noncommunicating versus thrombosed.  Marked narrowing of the origins of the celiac and superior mesenteric arteries which arise at the intimal flap junction with the small anterior native lumen.  Patchiness of BILATERAL nephrograms consistent with a degree of hypoperfusion/vascular impairment in both kidneys, more diffusely on the RIGHT and focally at the upper pole of the LEFT kidney.  Dissection extends into the RIGHT common iliac artery but not beyond.  Dissection extends into the LEFT common iliac artery into its bifurcation and then into the LEFT internal iliac artery which shows only a thin rim of residual enhancement of the native lumen; no enhancement is seen within the remaining portion of the LEFT internal iliac lumen.  No enhancement is identified within the LEFT external iliac artery, question noncommunicating versus thrombosed.  Findings discussed with ER PA Claudia at approximately 1940 hrs and with Dr. Clayborne Dana at 2000 hours on 03/10/2018.   Electronically Signed   By: Ulyses Southward M.D.   On: 03/10/2018 20:08    Impression:  Patient has acute type a aortic dissection.  I have personally reviewed the CT angiogram which confirmed the presence of an aortic dissection beginning in the aortic root involving the entire thoracic and abdominal aorta.  The dissection extends into the cranial vessels including the innominate artery and the right common carotid artery.   The dissection also extends throughout the abdomen involves both iliac arteries.  Pulses are diminished in the left arm and left lower leg although both appear adequately perfused and are not frankly ischemic at present.   Plan:  I have discussed the nature of the patient's clinical problem briefly with the patient and his family in his room in the emergency department.  The need for emergent surgical repair is been discussed.  Poor prognosis with medical therapy has been discussed.  The patient accepts all potential associated risks of surgery and subsequent complications related to his aortic dissection and desires to proceed with surgery immediately.  All questions answered.   I spent in excess of 60 minutes during the conduct of this hospital consultation and >50% of this time involved direct face-to-face encounter for counseling and/or coordination of the patient's care.    Salvatore Decent. Cornelius Moras, MD 03/10/2018 7:51 PM

## 2018-03-10 NOTE — Anesthesia Preprocedure Evaluation (Addendum)
Anesthesia Evaluation  Patient identified by MRN, date of birth, ID band Patient awake  General Assessment Comment:History noted. CG  Reviewed: Allergy & Precautions, NPO status , Patient's Chart, lab work & pertinent test results  Airway Mallampati: II  TM Distance: >3 FB Neck ROM: Full    Dental  (+) Teeth Intact   Pulmonary Current Smoker,    breath sounds clear to auscultation       Cardiovascular hypertension,  Rhythm:Regular Rate:Normal     Neuro/Psych    GI/Hepatic negative GI ROS, Neg liver ROS,   Endo/Other  negative endocrine ROS  Renal/GU negative Renal ROS     Musculoskeletal   Abdominal   Peds  Hematology   Anesthesia Other Findings   Reproductive/Obstetrics                           Anesthesia Physical Anesthesia Plan  ASA: IV and emergent  Anesthesia Plan: General   Post-op Pain Management:    Induction: Intravenous  PONV Risk Score and Plan: 1 and Treatment may vary due to age or medical condition and Midazolam  Airway Management Planned: Oral ETT  Additional Equipment: Arterial line, PA Cath, TEE and Ultrasound Guidance Line Placement  Intra-op Plan:   Post-operative Plan: Post-operative intubation/ventilation  Informed Consent: I have reviewed the patients History and Physical, chart, labs and discussed the procedure including the risks, benefits and alternatives for the proposed anesthesia with the patient or authorized representative who has indicated his/her understanding and acceptance.   Dental advisory given, Only emergency history available and History available from chart only  Plan Discussed with: CRNA, Anesthesiologist and Surgeon  Anesthesia Plan Comments:       Anesthesia Quick Evaluation

## 2018-03-10 NOTE — ED Notes (Signed)
NS wo per vo.

## 2018-03-10 NOTE — ED Notes (Signed)
Pt transported to OR with RN, Lorin Picket.

## 2018-03-10 NOTE — ED Notes (Signed)
Pt back from CT with RN

## 2018-03-10 NOTE — ED Provider Notes (Signed)
[facility] EMERGENCY DEPARTMENT Provider Note   CSN: [id number] Arrival date & time: 03/10/18  1901     History   Chief Complaint Chief Complaint  Patient presents with  . Chest Pain    HPI Derrick Clayton is a 37 y.o. male with history of tobacco use, hypertension is here for severe, burning substernal chest pain that began suddenly at 6 PM.  This radiated into his throat.  Since, the chest pain has improved however now he is having severe abdominal pain and left leg and left groin pain.  Other associated symptoms include shortness of breath, nausea and numbness to his left leg.  No interventions PTA.  No alleviating factors.  No aggravating factors.  He has not taken his antihypertensives in the last 4 days.  He received aspirin and 2 nitro in route by EMS and states that this helped his chest pain.  Denies IV drug use.  Denies illicit drug use.  Last oral intake water 6 PM.  Has not eaten today.  No anticoagulants.  HPI  Past Medical History:  Diagnosis Date  . Hypertension     Patient Active Problem List   Diagnosis Date Noted  . TOBACCO DEPENDENCE 07/24/2006  . HYPERTENSION, BENIGN SYSTEMIC 07/24/2006    Past Surgical History:  Procedure Laterality Date  . FOOT SURGERY          Home Medications    Prior to Admission medications   Medication Sig Start Date End Date Taking? Authorizing Provider  acetaminophen (TYLENOL) 500 MG tablet Take 500 mg by mouth every 6 (six) hours as needed (pain).    [provider]  acyclovir (ZOVIRAX) 800 MG tablet Take 0.5 tablets (400 mg total) by mouth 5 (five) times daily. 11/16/13   Palumbo, April, MD  amLODipine (NORVASC) 5 MG tablet Take 1 tablet (5 mg total) by mouth daily. 03/24/17   Georgetta Haber, NP  lisinopril (PRINIVIL,ZESTRIL) 10 MG tablet Take 10 mg by mouth daily.    [provider]  methocarbamol (ROBAXIN) 500 MG tablet Take 1 tablet (500 mg total) by mouth every 8 (eight) hours as  needed for muscle spasms. May cause drowsiness 03/24/17   Linus Mako B, NP  Metoprolol Tartrate (LOPRESSOR PO) Take by mouth.    [provider]    Family History Family History  Problem Relation Age of Onset  . Cancer Mother   . Heart failure Father     Social History Social History   Tobacco Use  . Smoking status: Current Every Day Smoker    Packs/day: 0.00    Types: Cigarettes  . Smokeless tobacco: Never Used  Substance Use Topics  . Alcohol use: Yes    Comment: 3-4 days a week  . Drug use: No     Allergies   Patient has no known allergies.   Review of Systems Review of Systems  Respiratory: Positive for shortness of breath.   Cardiovascular: Positive for chest pain.  Gastrointestinal: Positive for abdominal distention, abdominal pain and nausea.  Musculoskeletal: Positive for myalgias.  All other systems reviewed and are negative.    Physical Exam Updated Vital Signs BP (!) 118/51   Pulse 73   Resp (!) 22   Ht 6\' 1"  (1.854 m)   Wt 101.6 kg   SpO2 100%   BMI 29.55 kg/m   Physical Exam  Constitutional: He is oriented to person, place, and time. He appears well-developed and well-nourished. He appears distressed.  Yelling out in  pain in hall bed.  Diaphoretic.  HENT:  Head: Normocephalic and atraumatic.  Right Ear: External ear normal.  Left Ear: External ear normal.  Nose: Nose normal.  Eyes: Conjunctivae and EOM are normal. No scleral icterus.  Neck: Normal range of motion. Neck supple.  Cardiovascular: Normal rate, regular rhythm, normal heart sounds and intact distal pulses.  No murmur heard. Diminished pulse to the left radial, femoral, DP.  Bounding pulse to the right radial, femoral and right DP.  Pulmonary/Chest: Effort normal and breath sounds normal. He has no wheezes.  Abdominal: He exhibits distension. There is tenderness.  Moderate distention and rigidity diffuse abdomen.  Diffuse tenderness.  Musculoskeletal: Normal range  of motion. He exhibits no deformity.  Neurological: He is alert and oriented to person, place, and time.  Subjective decreased sensation to pinch to the left leg.  Skin: Skin is warm and dry. Capillary refill takes less than 2 seconds.  Psychiatric: He has a normal mood and affect. His behavior is normal. Judgment and thought content normal.  Nursing note and vitals reviewed.    ED Treatments / Results  Labs (all labs ordered are listed, but only abnormal results are displayed) Labs Reviewed  I-STAT CHEM 8, ED - Abnormal; Notable for the following components:      Result Value   Potassium 2.8 (*)    Creatinine, Ser 1.70 (*)    Glucose, Bld 195 (*)    Calcium, Ion 1.08 (*)    TCO2 18 (*)    All other components within normal limits  CBC  LIPASE, BLOOD  HEPATIC FUNCTION PANEL  MAGNESIUM  BASIC METABOLIC PANEL  RAPID URINE DRUG SCREEN, HOSP PERFORMED  I-STAT TROPONIN, ED    EKG None  Radiology Ct Angio Chest/abd/pel For Dissection W And/or Wo Contrast  Result Date: 03/10/2018 CLINICAL DATA:  Hypertension, chest and back pain EXAM: CT ANGIOGRAPHY CHEST, ABDOMEN AND PELVIS TECHNIQUE: Multidetector CT imaging through the chest, abdomen and pelvis was performed using the standard protocol during bolus administration of intravenous contrast. Multiplanar reconstructed images and MIPs were obtained and reviewed to evaluate the vascular anatomy. CONTRAST:  ISOVUE-370 IOPAMIDOL (ISOVUE-370) INJECTION 76% IV COMPARISON:  None. 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 No intramural hematoma on precontrast imaging. Following contrast, type a aortic dissection identified extending from the proximal ascending thoracic aorta from adjacent to the aortic valve through the ascending aorta, aortic arch, and descending thoracic aorta into abdomen. Bovine arch anatomy. Dissection extends cranially into the  common origin of the brachiocephalic artery/LEFT common carotid artery into the brachiocephalic artery and then into the proximal RIGHT common carotid artery. Only a thin rim of contrast enhancement is seen within the RIGHT common carotid artery, likely native lumen, question nonenhancing lumen versus thrombosis. BILATERAL subclavian and LEFT common carotid arteries are patent and enhancing. Pulmonary arteries patent. No pericardial effusion. Mediastinum/Nodes: Tiny hiatal hernia. No thoracic adenopathy. Base of cervical region normal appearance. Esophagus normal appearance. Lungs/Pleura: Lungs clear. No infiltrate, pleural effusion or pneumothorax. Small bleb subpleural anterior LEFT upper lobe adjacent to anterior junction line. Musculoskeletal: No acute osseous findings. Review of the MIP images confirms the above findings. CTA ABDOMEN AND PELVIS FINDINGS VASCULAR Aorta: Aortic dissection extends through the descending thoracic aorta through the aortic bifurcation into the common iliac arteries bilaterally. Aorta normal caliber. Scattered atherosclerotic calcifications aorta. Celiac: Arises at the dissection flap, with marked narrowing of the origin. SMA: Arises at the dissection  flap, with marked narrowing of the origin. Renals: RIGHT renal artery is patent and arises from the small true lumen. LEFT renal artery is patent and arises from the larger false lumen. Probable tiny accessory artery to the upper pole the LEFT kidney. IMA: Patent, arises from the smaller true lumen, narrowed at origin. Inflow: Scattered atherosclerotic calcifications common iliac arteries. Dissection extends into the RIGHT common iliac artery but not down the external iliac artery; RIGHT internal and external iliac arteries appear patent. Dissection extends down the LEFT common iliac artery into the common iliac bifurcation. Marked narrowing of the proximal LEFT internal iliac artery with a thin patent rim of contrast identified in a  diminutive true lumen adjacent to a larger non opacifying false lumen lumen. LEFT external iliac artery is unopacified throughout its course question nonenhancing versus thrombosed and reconstitutes distally at the common femoral level. Veins: Suboptimally assessed on CTA imaging Review of the MIP images confirms the above findings. NON-VASCULAR Hepatobiliary: Gallbladder and liver normal appearance Pancreas: Normal appearance Spleen: Normal appearance Adrenals/Urinary Tract: Adrenal glands normal appearance. Patchiness of the nephrograms bilaterally especially upper pole LEFT kidney question due to impaired flow in the tiny accessory artery to the upper pole. Mild diffuse patchiness of the RIGHT nephrogram is seen more diffusely suggesting hypoperfusion. Stomach/Bowel: Fluid-filled bowel loop in RIGHT pelvis question cecum. Small hiatal hernia. Stomach and remaining bowel loops unremarkable. Lymphatic: No adenopathy. Reproductive: Unremarkable prostate and seminal vesicles Other: No free air or free fluid. Tiny umbilical hernia containing fat. No acute inflammatory process. Musculoskeletal: No acute osseous findings. Review of the MIP images confirms the above findings. 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 Dissection extends into the bovine arch, up the brachiocephalic artery and into the RIGHT common carotid artery, which demonstrate only a tiny rim of enhancing native lumen with majority of vessel non-opacified question noncommunicating versus thrombosed. Marked narrowing of the origins of the celiac and superior mesenteric arteries which arise at the intimal flap junction with the small anterior native lumen. Patchiness of BILATERAL nephrograms consistent with a degree of hypoperfusion/vascular impairment in both kidneys, more diffusely on the  RIGHT and focally at the upper pole of the LEFT kidney. Dissection extends into the RIGHT common iliac artery but not beyond. Dissection extends into the LEFT common iliac artery into its bifurcation and then into the LEFT internal iliac artery which shows only a thin rim of residual enhancement of the native lumen; no enhancement is seen within the remaining portion of the LEFT internal iliac lumen. No enhancement is identified within the LEFT external iliac artery, question noncommunicating versus thrombosed. Findings discussed with ER PA Claudia at approximately 1940 hrs and with Dr. Clayborne Dana at 2000 hours on 03/10/2018. Electronically Signed   By: Ulyses Southward M.D.   On: 03/10/2018 20:08    Procedures .Critical Care Performed by: Liberty Handy, PA-C Authorized by: Liberty Handy, PA-C   Critical care provider statement:    Critical care time (minutes):  45   Critical care was time spent personally by me on the following activities:  Discussions with consultants, evaluation of patient's response to treatment, examination of patient, ordering and performing treatments and interventions, ordering and review of laboratory studies, ordering and review of radiographic studies, pulse oximetry, re-evaluation of patient's condition, obtaining history from patient or surrogate and review of old charts   I  assumed direction of critical care for this patient from another provider in my specialty: no     (including critical care time)  Medications Ordered in ED Medications  dexmedetomidine (PRECEDEX) 400 MCG/100ML (4 mcg/mL) infusion (has no administration in time range)  insulin regular, human (MYXREDLIN) 100 units/ 100 mL infusion (has no administration in time range)  EPINEPHrine (ADRENALIN) 4 mg in dextrose 5 % 250 mL (0.016 mg/mL) infusion (has no administration in time range)  DOPamine (INTROPIN) 800 mg in dextrose 5 % 250 mL (3.2 mg/mL) infusion (has no administration in time range)    milrinone (PRIMACOR) 20 MG/100 ML (0.2 mg/mL) infusion (has no administration in time range)  nitroGLYCERIN 50 mg in dextrose 5 % 250 mL (0.2 mg/mL) infusion (has no administration in time range)  phenylephrine (NEOSYNEPHRINE) 20-0.9 MG/250ML-% infusion (has no administration in time range)  heparin 2,500 Units, papaverine 30 mg in electrolyte-148 (PLASMALYTE-148) 500 mL irrigation (has no administration in time range)  heparin 30,000 units/NS 1000 mL solution for CELLSAVER (has no administration in time range)  potassium chloride injection 80 mEq (has no administration in time range)  magnesium sulfate (IV Push/IM) injection 40 mEq (has no administration in time range)  tranexamic acid (CYKLOKAPRON) pump prime solution 203 mg (has no administration in time range)  tranexamic acid (CYKLOKAPRON) bolus via infusion - over 30 minutes 1,524 mg (has no administration in time range)  tranexamic acid (CYKLOKAPRON) 2,500 mg in sodium chloride 0.9 % 250 mL (10 mg/mL) infusion (has no administration in time range)  vancomycin (VANCOCIN) 1,500 mg in sodium chloride 0.9 % 250 mL IVPB (has no administration in time range)  cefUROXime (ZINACEF) 1.5 g in sodium chloride 0.9 % 100 mL IVPB (has no administration in time range)  cefUROXime (ZINACEF) 750 mg in sodium chloride 0.9 % 100 mL IVPB (has no administration in time range)  vancomycin (VANCOCIN) 1,000 mg in sodium chloride 0.9 % 1,000 mL irrigation (has no administration in time range)  norepinephrine (LEVOPHED) 4mg  in D5W premix infusion (has no administration in time range)  esmolol (BREVIBLOC) 2000 mg / 100 mL (20 mg/mL) infusion (has no administration in time range)  fentaNYL (SUBLIMAZE) injection 50 mcg (50 mcg Intravenous Given 03/10/18 1940)  iopamidol (ISOVUE-370) 76 % injection (  Contrast Given 03/10/18 1915)  iopamidol (ISOVUE-370) 76 % injection 100 mL (100 mLs Intravenous Contrast Given 03/10/18 1915)     Initial Impression /  Assessment and Plan / ED Course  I have reviewed the triage vital signs and the nursing notes.  Pertinent labs & imaging results that were available during my care of the patient were reviewed by me and considered in my medical decision making (see chart for details).  Clinical Course as of Mar 11 2019  Tue Mar 10, 2018  1922 BP(!): 95/53 [CG]  1922 Creatinine(!): 1.70 [CG]  1922 Potassium(!): 2.8 [CG]  1951 Dr Barry Dienes in ER. Will take pt to OR    [CG]    Clinical Course User Index [CG] Liberty Handy, PA-C   Concern for cardiac ischemia vs dissection vs perforated viscus. BP initiall high, then low now improving.   1945: Radiology called regarding results of CT, patient has a type a dissection.  CT surgery has been consulted, Dr. Cornelius Moras will evaluate patient in the ER.  Blood pressure 124/47 at this time.  Family has been updated.  Fentanyl for pain.  K2.8, creatinine 1.70, hemoglobin 14.2.  Has been HD stable so far.  2020: Patient  transported to OR with RN Lorin Picket, last BP in the ER 118/51.  Family and patient have been updated.  Shared case with Dr. Clayborne Dana.  Final Clinical Impressions(s) / ED Diagnoses   Final diagnoses:  Dissection of thoracoabdominal aorta (HCC)  Acute kidney injury [facility])    ED Discharge Orders    None       Liberty Handy, PA-C 03/10/18 2020    Mesner, Barbara Cower, MD 03/11/18 [phone]

## 2018-03-10 NOTE — ED Triage Notes (Signed)
Per EMS pt is from home complains of 10/10 Central CP that radiates to left neck.  Pt has hx of Hypertension.  Pt took 324 of Asprin and EMS gave him 2 Nitro in route without relief.  NAD noted at this time.

## 2018-03-10 NOTE — Anesthesia Procedure Notes (Signed)
Central Venous Catheter Insertion Performed by: Dorris Singh, MD Start/End10/15/2019 8:20 PM, 03/10/2018 8:35 PM Patient location: OR. Preanesthetic checklist: patient identified, IV checked, site marked, risks and benefits discussed, surgical consent, monitors and equipment checked, pre-op evaluation and timeout performed Position: Trendelenburg Lidocaine 1% used for infiltration and patient sedated Hand hygiene performed , maximum sterile barriers used  and Seldinger technique used PA cath was placed.Sheath introducer Swan type:thermodilution Procedure performed using ultrasound guided technique. Ultrasound Notes:anatomy identified and image(s) printed for medical record Attempts: 1 Following insertion, line sutured, dressing applied and Biopatch. Post procedure assessment: blood return through all ports  Patient tolerated the procedure well with no immediate complications.

## 2018-03-10 NOTE — Consult Note (Addendum)
Cardiology Consultation:   Patient ID: Derrick Clayton MRN: [id number]; DOB: 10-Mar-1981  Admit date: (Not on file) Date of Consult: 03/10/2018  Primary Care Provider: Patient, No Pcp Per Primary Cardiologist: New Primary Electrophysiologist:  None    Patient Profile:   Derrick Clayton is a 37 y.o. male with a hx of HTN who ran out of his meds several days ago who is being seen today for the evaluation of acute chest and left leg pain at the request of [facility] ED.  History of Present Illness:   Derrick Clayton was initially brought in emergently by EMS for possible STEMI. However EKG was reviewed by on call STEMI doctor and did not meet STEMI criteria. EKG c/w J point elevation. W/u will be conducted in ED and pt will likely need admission for hypertensive urgency.   Pt's history is limited due to pt state. Appears uncomfortable and in pain, and not contributing much to history. Pt has HTN but admits that he ran out of his medications several days ago. He developed severe acute SSCP ~45 min prior to arriving to the ED. Severe pain, 10/10. Described as chest tightness. BP elevated in the ED at 160/132. Pt also endorsing severe, new onset left leg pain. Given marked BP elevation, chest pain and leg pain, he will need w/u to r/o dissection. EDP and hospitalist to manage HTN.   Past Medical History:  Diagnosis Date  . Hypertension     Past Surgical History:  Procedure Laterality Date  . FOOT SURGERY       Home Medications:  Prior to Admission medications   Medication Sig Start Date End Date Taking? Authorizing Provider  acetaminophen (TYLENOL) 500 MG tablet Take 500 mg by mouth every 6 (six) hours as needed (pain).    [provider]  acyclovir (ZOVIRAX) 800 MG tablet Take 0.5 tablets (400 mg total) by mouth 5 (five) times daily. 11/16/13   Palumbo, April, MD  amLODipine (NORVASC) 5 MG tablet Take 1 tablet (5 mg total) by mouth daily. 03/24/17   Georgetta Haber, NP  lisinopril  (PRINIVIL,ZESTRIL) 10 MG tablet Take 10 mg by mouth daily.    [provider]  methocarbamol (ROBAXIN) 500 MG tablet Take 1 tablet (500 mg total) by mouth every 8 (eight) hours as needed for muscle spasms. May cause drowsiness 03/24/17   Linus Mako B, NP  Metoprolol Tartrate (LOPRESSOR PO) Take by mouth.    [provider]    Inpatient Medications: Scheduled Meds:  Continuous Infusions:  PRN Meds:   Allergies:   No Known Allergies  Social History:   Social History   Socioeconomic History  . Marital status: Single    Spouse name: Not on file  . Number of children: Not on file  . Years of education: Not on file  . Highest education level: Not on file  Occupational History  . Not on file  Social Needs  . Financial resource strain: Not on file  . Food insecurity:    Worry: Not on file    Inability: Not on file  . Transportation needs:    Medical: Not on file    Non-medical: Not on file  Tobacco Use  . Smoking status: Current Every Day Smoker    Packs/day: 0.00    Types: Cigarettes  . Smokeless tobacco: Never Used  Substance and Sexual Activity  . Alcohol use: Yes    Comment: 3-4 days a week  . Drug use: No  . Sexual activity: Not on  file  Lifestyle  . Physical activity:    Days per week: Not on file    Minutes per session: Not on file  . Stress: Not on file  Relationships  . Social connections:    Talks on phone: Not on file    Gets together: Not on file    Attends religious service: Not on file    Active member of club or organization: Not on file    Attends meetings of clubs or organizations: Not on file    Relationship status: Not on file  . Intimate partner violence:    Fear of current or ex partner: Not on file    Emotionally abused: Not on file    Physically abused: Not on file    Forced sexual activity: Not on file  Other Topics Concern  . Not on file  Social History Narrative  . Not on file    Family History:    Family  History  Problem Relation Age of Onset  . Cancer Mother   . Heart failure Father      ROS:  Please see the history of present illness.   All other ROS reviewed and negative.     Physical Exam/Data:   Vitals:   03/10/18 1850  Weight: 101.6 kg  Height: 6\' 1"  (1.854 m)   No intake or output data in the 24 hours ending 03/10/18 1855 Filed Weights   03/10/18 1850  Weight: 101.6 kg   Body mass index is 29.55 kg/m.  General:  Well nourished, well developed AAM, in distress, writhing in pain HEENT: normal Lymph: no adenopathy Neck: no JVD Endocrine:  No thryomegaly Vascular: No carotid bruits; FA pulses 2+ bilaterally without bruits  Cardiac:  normal S1, S2; RRR; no murmur  Lungs:  clear to auscultation bilaterally, no wheezing, rhonchi or rales  Abd: soft, nontender, no hepatomegaly  Ext: no edema Musculoskeletal:  No deformities, BUE and BLE strength normal and equal Skin: warm and dry  Neuro:  CNs 2-12 intact, no focal abnormalities noted Psych:  Normal affect   EKG:  The EKG was personally reviewed and demonstrates:  NSR w/ J point elevation  Telemetry:  Telemetry was personally reviewed and demonstrates:  Not yet on tele  Relevant CV Studies: None   Laboratory Data:  ChemistryNo results for input(s): NA, K, CL, CO2, GLUCOSE, BUN, CREATININE, CALCIUM, GFRNONAA, GFRAA, ANIONGAP in the last 168 hours.  No results for input(s): PROT, ALBUMIN, AST, ALT, ALKPHOS, BILITOT in the last 168 hours. HematologyNo results for input(s): WBC, RBC, HGB, HCT, MCV, MCH, MCHC, RDW, PLT in the last 168 hours. Cardiac EnzymesNo results for input(s): TROPONINI in the last 168 hours. No results for input(s): TROPIPOC in the last 168 hours.  BNPNo results for input(s): BNP, PROBNP in the last 168 hours.  DDimer No results for input(s): DDIMER in the last 168 hours.  Radiology/Studies:  No results found.  Assessment and Plan:    Derrick Clayton is a 37 y.o. AA male with a hx of HTN who  ran out of his meds several days ago who is being seen today for the evaluation of acute chest and left leg pain at the request of [facility] ED.  Derrick Clayton was initially brought in emergently by EMS for possible STEMI. However EKG was reviewed by on call STEMI doctor and did not meet STEMI criteria. EKG c/w J point elevation. W/u will be conducted in ED and pt will likely need admission by hospitalist for hypertensive  urgency.   Pt's history is limited due to pt state. Appears uncomfortable and in pain, and not contributing much to history. Pt has HTN but admits that he ran out of his medications several days ago. He developed severe acute SSCP ~45 min prior to arriving to the ED. Severe pain, 10/10. Described as chest tightness. BP elevated in the ED at 160/132. Pt also endorsing severe, new onset left leg pain. Given marked BP elevation, chest pain and leg pain, he will need w/u to r/o dissection. EDP and hospitalist to manage HTN.   Recommendations  - STAT Chest CT to r/o dissection  - BP management  - Cycle troponins x 3  - Check UDS  - 2D echo in the AM to assess cardiac function  - NPO at midnight in case additional cardiac studies are needed, ? Stress test  - Cardiology will f/u in the AM on rounds.   - K is low at 2.8. Give supplemental K. Check Mg level. F/u BMP in the AM  - if persistent, unexplained, hypokalemia and HTN, consider treatment with spironolactone and w/u for secondary HTN   For questions or updates, please contact [facility] Please consult [email or web address] for contact info under     Signed, Robbie Lis, PA-C  03/10/2018 6:55 PM   The patient was seen and examined, and I agree with the history, physical exam, assessment and plan as documented above, with modifications as noted below. I have also personally reviewed all relevant documentation, old records, labs, and both radiographic and cardiovascular studies. I have also independently interpreted old and new  ECG's.  Briefly this is a 37 year old male who ran out of his antihypertensive medications this past Sunday.  At approximately 6 PM he experienced the sudden onset of retrosternal chest pain radiating into his neck with associated excruciating left leg pain.  ECG above shows sinus rhythm with nonspecific ST segment abnormalities consistent with J-point elevation.  Blood pressure is markedly elevated in the ED at 160/132.  Given his severe chest pain, severely uncontrolled blood pressure, and associated left leg pain, he was sent for CT angiography of the chest to rule out aortic dissection.  I reviewed the CT angiogram which demonstrates a type A aortic dissection extending from near the aortic valve through the ascending thoracic aorta, aortic arch, descending thoracic aorta, abdominal aorta, and through the aortic bifurcation.  Extends into the bovine arch, of the brachiocephalic artery, and into the right common carotid artery.  The dissection extends into the right common iliac artery as well as the left common iliac artery into its bifurcation and then into the left internal iliac artery.  During my evaluation, Derrick Clayton walked in to evaluate the patient and plans to take him to the OR immediately.   Prentice Docker, MD, [facility]  03/10/2018 8:11 PM

## 2018-03-11 ENCOUNTER — Inpatient Hospital Stay (HOSPITAL_COMMUNITY): Payer: Self-pay

## 2018-03-11 ENCOUNTER — Other Ambulatory Visit: Payer: Self-pay

## 2018-03-11 ENCOUNTER — Encounter (HOSPITAL_COMMUNITY): Payer: Self-pay | Admitting: Thoracic Surgery (Cardiothoracic Vascular Surgery)

## 2018-03-11 DIAGNOSIS — I7101 Dissection of thoracic aorta: Secondary | ICD-10-CM

## 2018-03-11 DIAGNOSIS — I10 Essential (primary) hypertension: Secondary | ICD-10-CM

## 2018-03-11 DIAGNOSIS — M79605 Pain in left leg: Secondary | ICD-10-CM

## 2018-03-11 DIAGNOSIS — Z9889 Other specified postprocedural states: Secondary | ICD-10-CM

## 2018-03-11 HISTORY — DX: Other specified postprocedural states: Z98.890

## 2018-03-11 LAB — POCT I-STAT 3, ART BLOOD GAS (G3+)
ACID-BASE DEFICIT: 2 mmol/L (ref 0.0–2.0)
ACID-BASE DEFICIT: 2 mmol/L (ref 0.0–2.0)
Acid-base deficit: 3 mmol/L — ABNORMAL HIGH (ref 0.0–2.0)
Acid-base deficit: 3 mmol/L — ABNORMAL HIGH (ref 0.0–2.0)
Acid-base deficit: 5 mmol/L — ABNORMAL HIGH (ref 0.0–2.0)
Acid-base deficit: 8 mmol/L — ABNORMAL HIGH (ref 0.0–2.0)
BICARBONATE: 19.3 mmol/L — AB (ref 20.0–28.0)
BICARBONATE: 21.9 mmol/L (ref 20.0–28.0)
BICARBONATE: 22.7 mmol/L (ref 20.0–28.0)
Bicarbonate: 24.4 mmol/L (ref 20.0–28.0)
Bicarbonate: 22.5 mmol/L (ref 20.0–28.0)
Bicarbonate: 23.6 mmol/L (ref 20.0–28.0)
Bicarbonate: 24.7 mmol/L (ref 20.0–28.0)
O2 SAT: 99 %
O2 Saturation: 100 %
O2 Saturation: 100 %
O2 Saturation: 100 %
O2 Saturation: 99 %
O2 Saturation: 96 %
O2 Saturation: 97 %
PCO2 ART: 51.3 mmHg — AB (ref 32.0–48.0)
PCO2 ART: 55.2 mmHg — AB (ref 32.0–48.0)
PCO2 ART: 41.8 mmHg (ref 32.0–48.0)
PH ART: 7.231 — AB (ref 7.350–7.450)
PH ART: 7.286 — AB (ref 7.350–7.450)
PH ART: 7.374 (ref 7.350–7.450)
PH ART: 7.383 (ref 7.350–7.450)
PO2 ART: 318 mmHg — AB (ref 83.0–108.0)
Patient temperature: 35
Patient temperature: 38
TCO2: 24 mmol/L (ref 22–32)
TCO2: 25 mmol/L (ref 22–32)
TCO2: 21 mmol/L — ABNORMAL LOW (ref 22–32)
TCO2: 24 mmol/L (ref 22–32)
TCO2: 26 mmol/L (ref 22–32)
TCO2: 23 mmol/L (ref 22–32)
TCO2: 26 mmol/L (ref 22–32)
pCO2 arterial: 40.3 mmHg (ref 32.0–48.0)
pCO2 arterial: 37.9 mmHg (ref 32.0–48.0)
pCO2 arterial: 46 mmHg (ref 32.0–48.0)
pCO2 arterial: 36.4 mmHg (ref 32.0–48.0)
pH, Arterial: 7.221 — ABNORMAL LOW (ref 7.350–7.450)
pH, Arterial: 7.372 (ref 7.350–7.450)
pH, Arterial: 7.391 (ref 7.350–7.450)
pO2, Arterial: 161 mmHg — ABNORMAL HIGH (ref 83.0–108.0)
pO2, Arterial: 141 mmHg — ABNORMAL HIGH (ref 83.0–108.0)
pO2, Arterial: 320 mmHg — ABNORMAL HIGH (ref 83.0–108.0)
pO2, Arterial: 397 mmHg — ABNORMAL HIGH (ref 83.0–108.0)
pO2, Arterial: 89 mmHg (ref 83.0–108.0)
pO2, Arterial: 97 mmHg (ref 83.0–108.0)

## 2018-03-11 LAB — POCT I-STAT, CHEM 8
BUN: 13 mg/dL (ref 6–20)
BUN: 12 mg/dL (ref 6–20)
BUN: 12 mg/dL (ref 6–20)
BUN: 12 mg/dL (ref 6–20)
BUN: 12 mg/dL (ref 6–20)
BUN: 13 mg/dL (ref 6–20)
BUN: 9 mg/dL (ref 6–20)
BUN: 17 mg/dL (ref 6–20)
BUN: 11 mg/dL (ref 6–20)
CALCIUM ION: 1.07 mmol/L — AB (ref 1.15–1.40)
CHLORIDE: 104 mmol/L (ref 98–111)
CHLORIDE: 101 mmol/L (ref 98–111)
CHLORIDE: 104 mmol/L (ref 98–111)
CHLORIDE: 102 mmol/L (ref 98–111)
CREATININE: 1.1 mg/dL (ref 0.61–1.24)
CREATININE: 1.3 mg/dL — AB (ref 0.61–1.24)
CREATININE: 1.4 mg/dL — AB (ref 0.61–1.24)
Calcium, Ion: 1.01 mmol/L — ABNORMAL LOW (ref 1.15–1.40)
Calcium, Ion: 0.97 mmol/L — ABNORMAL LOW (ref 1.15–1.40)
Calcium, Ion: 0.98 mmol/L — ABNORMAL LOW (ref 1.15–1.40)
Calcium, Ion: 0.98 mmol/L — ABNORMAL LOW (ref 1.15–1.40)
Calcium, Ion: 1.06 mmol/L — ABNORMAL LOW (ref 1.15–1.40)
Calcium, Ion: 1.12 mmol/L — ABNORMAL LOW (ref 1.15–1.40)
Calcium, Ion: 1.16 mmol/L (ref 1.15–1.40)
Calcium, Ion: 1.18 mmol/L (ref 1.15–1.40)
Chloride: 105 mmol/L (ref 98–111)
Chloride: 102 mmol/L (ref 98–111)
Chloride: 102 mmol/L (ref 98–111)
Chloride: 99 mmol/L (ref 98–111)
Chloride: 106 mmol/L (ref 98–111)
Creatinine, Ser: 1.2 mg/dL (ref 0.61–1.24)
Creatinine, Ser: 0.9 mg/dL (ref 0.61–1.24)
Creatinine, Ser: 1.1 mg/dL (ref 0.61–1.24)
Creatinine, Ser: 1.1 mg/dL (ref 0.61–1.24)
Creatinine, Ser: 1.2 mg/dL (ref 0.61–1.24)
Creatinine, Ser: 1.3 mg/dL — ABNORMAL HIGH (ref 0.61–1.24)
GLUCOSE: 129 mg/dL — AB (ref 70–99)
GLUCOSE: 102 mg/dL — AB (ref 70–99)
GLUCOSE: 125 mg/dL — AB (ref 70–99)
GLUCOSE: 115 mg/dL — AB (ref 70–99)
Glucose, Bld: 112 mg/dL — ABNORMAL HIGH (ref 70–99)
Glucose, Bld: 170 mg/dL — ABNORMAL HIGH (ref 70–99)
Glucose, Bld: 176 mg/dL — ABNORMAL HIGH (ref 70–99)
Glucose, Bld: 217 mg/dL — ABNORMAL HIGH (ref 70–99)
Glucose, Bld: 229 mg/dL — ABNORMAL HIGH (ref 70–99)
HCT: 40 % (ref 39.0–52.0)
HCT: 40 % (ref 39.0–52.0)
HCT: 33 % — ABNORMAL LOW (ref 39.0–52.0)
HCT: 27 % — ABNORMAL LOW (ref 39.0–52.0)
HEMATOCRIT: 26 % — AB (ref 39.0–52.0)
HEMATOCRIT: 29 % — AB (ref 39.0–52.0)
HEMATOCRIT: 32 % — AB (ref 39.0–52.0)
HEMATOCRIT: 32 % — AB (ref 39.0–52.0)
HEMATOCRIT: 35 % — AB (ref 39.0–52.0)
HEMOGLOBIN: 13.6 g/dL (ref 13.0–17.0)
HEMOGLOBIN: 13.6 g/dL (ref 13.0–17.0)
HEMOGLOBIN: 9.2 g/dL — AB (ref 13.0–17.0)
Hemoglobin: 11.2 g/dL — ABNORMAL LOW (ref 13.0–17.0)
Hemoglobin: 10.9 g/dL — ABNORMAL LOW (ref 13.0–17.0)
Hemoglobin: 10.9 g/dL — ABNORMAL LOW (ref 13.0–17.0)
Hemoglobin: 11.9 g/dL — ABNORMAL LOW (ref 13.0–17.0)
Hemoglobin: 8.8 g/dL — ABNORMAL LOW (ref 13.0–17.0)
Hemoglobin: 9.9 g/dL — ABNORMAL LOW (ref 13.0–17.0)
POTASSIUM: 3.7 mmol/L (ref 3.5–5.1)
POTASSIUM: 4.2 mmol/L (ref 3.5–5.1)
POTASSIUM: 4.4 mmol/L (ref 3.5–5.1)
POTASSIUM: 4.7 mmol/L (ref 3.5–5.1)
POTASSIUM: 4.8 mmol/L (ref 3.5–5.1)
POTASSIUM: 4.8 mmol/L (ref 3.5–5.1)
POTASSIUM: 5.1 mmol/L (ref 3.5–5.1)
Potassium: 4.4 mmol/L (ref 3.5–5.1)
Potassium: 5.9 mmol/L — ABNORMAL HIGH (ref 3.5–5.1)
SODIUM: 134 mmol/L — AB (ref 135–145)
SODIUM: 134 mmol/L — AB (ref 135–145)
SODIUM: 136 mmol/L (ref 135–145)
SODIUM: 137 mmol/L (ref 135–145)
SODIUM: 138 mmol/L (ref 135–145)
SODIUM: 139 mmol/L (ref 135–145)
Sodium: 141 mmol/L (ref 135–145)
Sodium: 139 mmol/L (ref 135–145)
Sodium: 140 mmol/L (ref 135–145)
TCO2: 24 mmol/L (ref 22–32)
TCO2: 21 mmol/L — ABNORMAL LOW (ref 22–32)
TCO2: 22 mmol/L (ref 22–32)
TCO2: 22 mmol/L (ref 22–32)
TCO2: 24 mmol/L (ref 22–32)
TCO2: 24 mmol/L (ref 22–32)
TCO2: 24 mmol/L (ref 22–32)
TCO2: 20 mmol/L — AB (ref 22–32)
TCO2: 19 mmol/L — AB (ref 22–32)

## 2018-03-11 LAB — CBC
HCT: 34 % — ABNORMAL LOW (ref 39.0–52.0)
HEMATOCRIT: 33.5 % — AB (ref 39.0–52.0)
HEMATOCRIT: 29.4 % — AB (ref 39.0–52.0)
HEMOGLOBIN: 11.1 g/dL — AB (ref 13.0–17.0)
HEMOGLOBIN: 9.6 g/dL — AB (ref 13.0–17.0)
Hemoglobin: 11.1 g/dL — ABNORMAL LOW (ref 13.0–17.0)
MCH: 29.8 pg (ref 26.0–34.0)
MCH: 29.6 pg (ref 26.0–34.0)
MCH: 29.3 pg (ref 26.0–34.0)
MCHC: 32.6 g/dL (ref 30.0–36.0)
MCHC: 33.1 g/dL (ref 30.0–36.0)
MCHC: 32.7 g/dL (ref 30.0–36.0)
MCV: 91.2 fL (ref 80.0–100.0)
MCV: 89.3 fL (ref 80.0–100.0)
MCV: 89.6 fL (ref 80.0–100.0)
NRBC: 0 % (ref 0.0–0.2)
PLATELETS: 200 10*3/uL (ref 150–400)
Platelets: 132 10*3/uL — ABNORMAL LOW (ref 150–400)
Platelets: 142 10*3/uL — ABNORMAL LOW (ref 150–400)
RBC: 3.73 MIL/uL — ABNORMAL LOW (ref 4.22–5.81)
RBC: 3.75 MIL/uL — AB (ref 4.22–5.81)
RBC: 3.28 MIL/uL — ABNORMAL LOW (ref 4.22–5.81)
RDW: 13 % (ref 11.5–15.5)
RDW: 13.7 % (ref 11.5–15.5)
RDW: 14.6 % (ref 11.5–15.5)
WBC: 14.3 10*3/uL — ABNORMAL HIGH (ref 4.0–10.5)
WBC: 12.4 10*3/uL — ABNORMAL HIGH (ref 4.0–10.5)
WBC: 13.5 10*3/uL — AB (ref 4.0–10.5)
nRBC: 0 % (ref 0.0–0.2)
nRBC: 0 % (ref 0.0–0.2)

## 2018-03-11 LAB — GLUCOSE, CAPILLARY
GLUCOSE-CAPILLARY: 146 mg/dL — AB (ref 70–99)
GLUCOSE-CAPILLARY: 122 mg/dL — AB (ref 70–99)
GLUCOSE-CAPILLARY: 126 mg/dL — AB (ref 70–99)
GLUCOSE-CAPILLARY: 124 mg/dL — AB (ref 70–99)
GLUCOSE-CAPILLARY: 130 mg/dL — AB (ref 70–99)
Glucose-Capillary: 100 mg/dL — ABNORMAL HIGH (ref 70–99)
Glucose-Capillary: 103 mg/dL — ABNORMAL HIGH (ref 70–99)
Glucose-Capillary: 116 mg/dL — ABNORMAL HIGH (ref 70–99)
Glucose-Capillary: 117 mg/dL — ABNORMAL HIGH (ref 70–99)
Glucose-Capillary: 121 mg/dL — ABNORMAL HIGH (ref 70–99)
Glucose-Capillary: 122 mg/dL — ABNORMAL HIGH (ref 70–99)
Glucose-Capillary: 123 mg/dL — ABNORMAL HIGH (ref 70–99)
Glucose-Capillary: 148 mg/dL — ABNORMAL HIGH (ref 70–99)
Glucose-Capillary: 95 mg/dL (ref 70–99)
Glucose-Capillary: 96 mg/dL (ref 70–99)

## 2018-03-11 LAB — POCT I-STAT 4, (NA,K, GLUC, HGB,HCT)
GLUCOSE: 114 mg/dL — AB (ref 70–99)
GLUCOSE: 116 mg/dL — AB (ref 70–99)
HEMATOCRIT: 33 % — AB (ref 39.0–52.0)
HEMATOCRIT: 33 % — AB (ref 39.0–52.0)
HEMOGLOBIN: 11.2 g/dL — AB (ref 13.0–17.0)
HEMOGLOBIN: 11.2 g/dL — AB (ref 13.0–17.0)
POTASSIUM: 5.8 mmol/L — AB (ref 3.5–5.1)
Potassium: 6 mmol/L — ABNORMAL HIGH (ref 3.5–5.1)
SODIUM: 138 mmol/L (ref 135–145)
SODIUM: 139 mmol/L (ref 135–145)

## 2018-03-11 LAB — DIC (DISSEMINATED INTRAVASCULAR COAGULATION)PANEL
D-Dimer, Quant: 5.69 ug/mL-FEU — ABNORMAL HIGH (ref 0.00–0.50)
INR: 1.97
Prothrombin Time: 22.2 seconds — ABNORMAL HIGH (ref 11.4–15.2)
aPTT: 37 seconds — ABNORMAL HIGH (ref 24–36)

## 2018-03-11 LAB — CREATININE, SERUM
CREATININE: 1.54 mg/dL — AB (ref 0.61–1.24)
GFR calc Af Amer: >60 mL/min (ref >60)
GFR calc non Af Amer: 56 mL/min — ABNORMAL LOW (ref >60)

## 2018-03-11 LAB — BASIC METABOLIC PANEL
Anion gap: 7 (ref 5–15)
BUN: 12 mg/dL (ref 6–20)
CHLORIDE: 107 mmol/L (ref 98–111)
CO2: 24 mmol/L (ref 22–32)
CREATININE: 1.2 mg/dL (ref 0.61–1.24)
Calcium: 7.2 mg/dL — ABNORMAL LOW (ref 8.9–10.3)
GFR calc non Af Amer: >60 mL/min (ref >60)
Glucose, Bld: 128 mg/dL — ABNORMAL HIGH (ref 70–99)
POTASSIUM: 5.9 mmol/L — AB (ref 3.5–5.1)
Sodium: 138 mmol/L (ref 135–145)

## 2018-03-11 LAB — DIC (DISSEMINATED INTRAVASCULAR COAGULATION) PANEL
FIBRINOGEN: 116 mg/dL — AB (ref 210–475)
PLATELETS: 103 10*3/uL — AB (ref 150–400)
SMEAR REVIEW: NONE SEEN

## 2018-03-11 LAB — HEMOGLOBIN AND HEMATOCRIT, BLOOD
HCT: 27.3 % — ABNORMAL LOW (ref 39.0–52.0)
HEMOGLOBIN: 8.8 g/dL — AB (ref 13.0–17.0)

## 2018-03-11 LAB — APTT
aPTT: 34 seconds (ref 24–36)
aPTT: 34 seconds (ref 24–36)

## 2018-03-11 LAB — PROTIME-INR
INR: 1.69
INR: 1.41
Prothrombin Time: 19.7 seconds — ABNORMAL HIGH (ref 11.4–15.2)
Prothrombin Time: 17.1 seconds — ABNORMAL HIGH (ref 11.4–15.2)

## 2018-03-11 LAB — MAGNESIUM
Magnesium: 2.8 mg/dL — ABNORMAL HIGH (ref 1.7–2.4)
Magnesium: 2.5 mg/dL — ABNORMAL HIGH (ref 1.7–2.4)

## 2018-03-11 LAB — ECHO INTRAOPERATIVE TEE
Height: 73 in
WEIGHTICAEL: 3975.33 [oz_av]

## 2018-03-11 LAB — PLATELET COUNT: Platelets: 140 10*3/uL — ABNORMAL LOW (ref 150–400)

## 2018-03-11 LAB — BLOOD PRODUCT ORDER (VERBAL) VERIFICATION

## 2018-03-11 LAB — FIBRINOGEN: FIBRINOGEN: 130 mg/dL — AB (ref 210–475)

## 2018-03-11 MED ORDER — INSULIN ASPART 100 UNIT/ML ~~LOC~~ SOLN: 0.0000 [IU] | SUBCUTANEOUS | Status: DC

## 2018-03-11 MED ORDER — SODIUM CHLORIDE 0.9 % IV SOLN
INTRAVENOUS | Status: AC
  Administered 2018-03-11: 05:00:00 via INTRAVENOUS

## 2018-03-11 MED ORDER — SODIUM CHLORIDE 0.9% IV SOLUTION: Freq: Once | INTRAVENOUS | Status: AC

## 2018-03-11 MED ORDER — MIDAZOLAM HCL 2 MG/2ML IJ SOLN
2.0000 mg | INTRAMUSCULAR | Status: DC | PRN
  Administered 2018-03-11 (×3): 2 mg via INTRAVENOUS
  Filled 2018-03-11 (×4): qty 2

## 2018-03-11 MED ORDER — NITROGLYCERIN IN D5W 200-5 MCG/ML-% IV SOLN: 0.0000 ug/min | INTRAVENOUS | Status: DC

## 2018-03-11 MED ORDER — OXYCODONE HCL 5 MG PO TABS
5.0000 mg | ORAL_TABLET | ORAL | Status: DC | PRN
  Administered 2018-03-11 – 2018-03-17 (×23): 10 mg via ORAL
  Administered 2018-03-17: 5 mg via ORAL
  Administered 2018-03-18 (×2): 10 mg via ORAL
  Filled 2018-03-11 (×24): qty 2
  Filled 2018-03-11: qty 1
  Filled 2018-03-11: qty 2

## 2018-03-11 MED ORDER — METOPROLOL TARTRATE 5 MG/5ML IV SOLN
2.5000 mg | INTRAVENOUS | Status: DC | PRN
  Administered 2018-03-13 – 2018-03-16 (×2): 5 mg via INTRAVENOUS
  Filled 2018-03-11 (×2): qty 5

## 2018-03-11 MED ORDER — CHLORHEXIDINE GLUCONATE CLOTH 2 % EX PADS
6.0000 | MEDICATED_PAD | Freq: Every day | CUTANEOUS | Status: DC
  Administered 2018-03-11 – 2018-03-13 (×2): 6 via TOPICAL

## 2018-03-11 MED ORDER — HEPARIN SODIUM (PORCINE) 1000 UNIT/ML IJ SOLN
INTRAMUSCULAR | Status: AC
  Filled 2018-03-11: qty 1

## 2018-03-11 MED ORDER — MIDAZOLAM HCL 2 MG/2ML IJ SOLN
INTRAMUSCULAR | Status: AC
  Filled 2018-03-11: qty 2

## 2018-03-11 MED ORDER — SODIUM CHLORIDE 0.9 % IV SOLN: INTRAVENOUS | Status: DC

## 2018-03-11 MED ORDER — SODIUM BICARBONATE 8.4 % IV SOLN
INTRAVENOUS | Status: DC | PRN
  Administered 2018-03-11: 50 meq via INTRAVENOUS

## 2018-03-11 MED ORDER — ONDANSETRON HCL 4 MG/2ML IJ SOLN
4.0000 mg | Freq: Four times a day (QID) | INTRAMUSCULAR | Status: DC | PRN
  Administered 2018-03-12: 4 mg via INTRAVENOUS
  Filled 2018-03-11: qty 2

## 2018-03-11 MED ORDER — PROTAMINE SULFATE 10 MG/ML IV SOLN
INTRAVENOUS | Status: AC
  Filled 2018-03-11: qty 25

## 2018-03-11 MED ORDER — SODIUM CHLORIDE 0.9 % IV SOLN: 250.0000 mL | INTRAVENOUS | Status: DC

## 2018-03-11 MED ORDER — SODIUM CHLORIDE 0.9% FLUSH
3.0000 mL | Freq: Two times a day (BID) | INTRAVENOUS | Status: DC
  Administered 2018-03-12 – 2018-03-18 (×11): 3 mL via INTRAVENOUS

## 2018-03-11 MED ORDER — FAMOTIDINE IN NACL 20-0.9 MG/50ML-% IV SOLN
20.0000 mg | Freq: Two times a day (BID) | INTRAVENOUS | Status: DC
  Administered 2018-03-11: 20 mg via INTRAVENOUS

## 2018-03-11 MED ORDER — CHLORHEXIDINE GLUCONATE 0.12% ORAL RINSE (MEDLINE KIT)
15.0000 mL | Freq: Two times a day (BID) | OROMUCOSAL | Status: DC
  Administered 2018-03-11: 15 mL via OROMUCOSAL

## 2018-03-11 MED ORDER — PANTOPRAZOLE SODIUM 40 MG PO TBEC
40.0000 mg | DELAYED_RELEASE_TABLET | Freq: Every day | ORAL | Status: DC
  Administered 2018-03-13 – 2018-03-18 (×6): 40 mg via ORAL
  Filled 2018-03-11 (×7): qty 1

## 2018-03-11 MED ORDER — FUROSEMIDE 10 MG/ML IJ SOLN
20.0000 mg | Freq: Once | INTRAMUSCULAR | Status: AC
  Administered 2018-03-11: 20 mg via INTRAVENOUS
  Filled 2018-03-11: qty 2

## 2018-03-11 MED ORDER — DOCUSATE SODIUM 100 MG PO CAPS
200.0000 mg | ORAL_CAPSULE | Freq: Every day | ORAL | Status: DC
  Administered 2018-03-12 – 2018-03-18 (×7): 200 mg via ORAL
  Filled 2018-03-11 (×7): qty 2

## 2018-03-11 MED ORDER — BISACODYL 10 MG RE SUPP: 10.0000 mg | Freq: Every day | RECTAL | Status: DC

## 2018-03-11 MED ORDER — INSULIN ASPART 100 UNIT/ML ~~LOC~~ SOLN
0.0000 [IU] | SUBCUTANEOUS | Status: DC
  Administered 2018-03-11 – 2018-03-12 (×5): 2 [IU] via SUBCUTANEOUS

## 2018-03-11 MED ORDER — VANCOMYCIN HCL IN DEXTROSE 1-5 GM/200ML-% IV SOLN
1000.0000 mg | Freq: Once | INTRAVENOUS | Status: AC
  Administered 2018-03-11: 1000 mg via INTRAVENOUS
  Filled 2018-03-11: qty 200

## 2018-03-11 MED ORDER — HEPARIN SODIUM (PORCINE) 1000 UNIT/ML IJ SOLN
INTRAMUSCULAR | Status: AC
  Filled 2018-03-11: qty 4

## 2018-03-11 MED ORDER — BISACODYL 5 MG PO TBEC
10.0000 mg | DELAYED_RELEASE_TABLET | Freq: Every day | ORAL | Status: DC
  Administered 2018-03-12 – 2018-03-18 (×7): 10 mg via ORAL
  Filled 2018-03-11 (×8): qty 2

## 2018-03-11 MED ORDER — LABETALOL HCL 5 MG/ML IV SOLN: 10.0000 mg | INTRAVENOUS | Status: DC | PRN

## 2018-03-11 MED ORDER — SODIUM CHLORIDE 0.9 % IV SOLN
1.5000 g | Freq: Two times a day (BID) | INTRAVENOUS | Status: AC
  Administered 2018-03-11 – 2018-03-12 (×4): 1.5 g via INTRAVENOUS
  Filled 2018-03-11 (×4): qty 1.5

## 2018-03-11 MED ORDER — ACETAMINOPHEN 160 MG/5ML PO SOLN
1000.0000 mg | Freq: Four times a day (QID) | ORAL | Status: DC
  Administered 2018-03-11: 1000 mg
  Filled 2018-03-11: qty 40.6

## 2018-03-11 MED ORDER — NOREPINEPHRINE BITARTRATE 1 MG/ML IV SOLN: INTRAVENOUS | Status: DC | PRN

## 2018-03-11 MED ORDER — SODIUM CHLORIDE 0.9% IV SOLUTION
Freq: Once | INTRAVENOUS | Status: AC
  Administered 2018-03-11: 09:00:00 via INTRAVENOUS

## 2018-03-11 MED ORDER — SODIUM CHLORIDE 0.9% FLUSH
10.0000 mL | Freq: Two times a day (BID) | INTRAVENOUS | Status: DC
  Administered 2018-03-11: 40 mL
  Administered 2018-03-11 – 2018-03-13 (×2): 10 mL

## 2018-03-11 MED ORDER — MAGNESIUM SULFATE 4 GM/100ML IV SOLN
4.0000 g | Freq: Once | INTRAVENOUS | Status: AC
  Administered 2018-03-11: 4 g via INTRAVENOUS
  Filled 2018-03-11: qty 100

## 2018-03-11 MED ORDER — LACTATED RINGERS IV SOLN
INTRAVENOUS | Status: DC
  Administered 2018-03-11 (×2): via INTRAVENOUS

## 2018-03-11 MED ORDER — TRAMADOL HCL 50 MG PO TABS
50.0000 mg | ORAL_TABLET | ORAL | Status: DC | PRN
  Administered 2018-03-13: 50 mg via ORAL
  Administered 2018-03-15: 100 mg via ORAL
  Filled 2018-03-11: qty 1
  Filled 2018-03-11: qty 2

## 2018-03-11 MED ORDER — METOPROLOL TARTRATE 25 MG/10 ML ORAL SUSPENSION: 12.5000 mg | Freq: Two times a day (BID) | ORAL | Status: DC

## 2018-03-11 MED ORDER — INSULIN REGULAR(HUMAN) IN NACL 100-0.9 UT/100ML-% IV SOLN: INTRAVENOUS | Status: DC

## 2018-03-11 MED ORDER — PROTAMINE SULFATE 10 MG/ML IV SOLN
INTRAVENOUS | Status: AC
  Filled 2018-03-11: qty 15

## 2018-03-11 MED ORDER — SODIUM CHLORIDE 0.45 % IV SOLN: INTRAVENOUS | Status: DC | PRN

## 2018-03-11 MED ORDER — ORAL CARE MOUTH RINSE
15.0000 mL | OROMUCOSAL | Status: DC
  Administered 2018-03-11 (×2): 15 mL via OROMUCOSAL

## 2018-03-11 MED ORDER — PHENYLEPHRINE HCL-NACL 20-0.9 MG/250ML-% IV SOLN
0.0000 ug/min | INTRAVENOUS | Status: DC
  Administered 2018-03-11: 35 ug/min via INTRAVENOUS
  Administered 2018-03-11: 38 ug/min via INTRAVENOUS
  Filled 2018-03-11 (×4): qty 250

## 2018-03-11 MED ORDER — LACTATED RINGERS IV SOLN: 500.0000 mL | Freq: Once | INTRAVENOUS | Status: DC | PRN

## 2018-03-11 MED ORDER — ACETAMINOPHEN 650 MG RE SUPP: 650.0000 mg | Freq: Once | RECTAL | Status: AC

## 2018-03-11 MED ORDER — CHLORHEXIDINE GLUCONATE 0.12 % MT SOLN
15.0000 mL | OROMUCOSAL | Status: AC
  Administered 2018-03-11: 15 mL via OROMUCOSAL

## 2018-03-11 MED ORDER — PROTAMINE SULFATE 10 MG/ML IV SOLN
INTRAVENOUS | Status: DC | PRN
  Administered 2018-03-11: 370 mg via INTRAVENOUS

## 2018-03-11 MED ORDER — ALBUMIN HUMAN 5 % IV SOLN
INTRAVENOUS | Status: DC | PRN
  Administered 2018-03-11 (×4): via INTRAVENOUS

## 2018-03-11 MED ORDER — ASPIRIN EC 325 MG PO TBEC
325.0000 mg | DELAYED_RELEASE_TABLET | Freq: Every day | ORAL | Status: DC
  Administered 2018-03-12 – 2018-03-18 (×7): 325 mg via ORAL
  Filled 2018-03-11 (×7): qty 1

## 2018-03-11 MED ORDER — METOPROLOL TARTRATE 25 MG PO TABS
12.5000 mg | ORAL_TABLET | Freq: Two times a day (BID) | ORAL | Status: DC
  Administered 2018-03-11 – 2018-03-12 (×3): 12.5 mg via ORAL
  Filled 2018-03-11 (×3): qty 1

## 2018-03-11 MED ORDER — SODIUM CHLORIDE 0.9% FLUSH
3.0000 mL | INTRAVENOUS | Status: DC | PRN
  Administered 2018-03-15: 3 mL via INTRAVENOUS
  Filled 2018-03-11: qty 3

## 2018-03-11 MED ORDER — ACETAMINOPHEN 500 MG PO TABS
1000.0000 mg | ORAL_TABLET | Freq: Four times a day (QID) | ORAL | Status: AC
  Administered 2018-03-11 – 2018-03-16 (×16): 1000 mg via ORAL
  Filled 2018-03-11 (×16): qty 2

## 2018-03-11 MED ORDER — MORPHINE SULFATE (PF) 2 MG/ML IV SOLN
1.0000 mg | INTRAVENOUS | Status: DC | PRN
  Administered 2018-03-11 – 2018-03-13 (×15): 2 mg via INTRAVENOUS
  Filled 2018-03-11 (×16): qty 1

## 2018-03-11 MED ORDER — DEXMEDETOMIDINE HCL IN NACL 400 MCG/100ML IV SOLN
0.0000 ug/kg/h | INTRAVENOUS | Status: DC
  Administered 2018-03-11 (×2): 0.7 ug/kg/h via INTRAVENOUS
  Filled 2018-03-11: qty 100

## 2018-03-11 MED ORDER — POTASSIUM CHLORIDE 10 MEQ/50ML IV SOLN: 10.0000 meq | INTRAVENOUS | Status: AC

## 2018-03-11 MED ORDER — INSULIN REGULAR BOLUS VIA INFUSION
0.0000 [IU] | Freq: Three times a day (TID) | INTRAVENOUS | Status: DC
  Filled 2018-03-11: qty 10

## 2018-03-11 MED ORDER — MORPHINE SULFATE (PF) 2 MG/ML IV SOLN
1.0000 mg | INTRAVENOUS | Status: AC | PRN
  Administered 2018-03-11: 1 mg via INTRAVENOUS

## 2018-03-11 MED ORDER — COAGULATION FACTOR VIIA RECOMB 1 MG IV SOLR
45.0000 ug/kg | Freq: Once | INTRAVENOUS | Status: AC
  Administered 2018-03-11: 5000 ug via INTRAVENOUS
  Filled 2018-03-11: qty 5

## 2018-03-11 MED ORDER — SODIUM CHLORIDE 0.9% FLUSH: 10.0000 mL | INTRAVENOUS | Status: DC | PRN

## 2018-03-11 MED ORDER — SODIUM CHLORIDE 0.9 % IV SOLN
INTRAVENOUS | Status: DC | PRN
  Administered 2018-03-11: 40 ug/min via INTRAVENOUS

## 2018-03-11 MED ORDER — LACTATED RINGERS IV SOLN
INTRAVENOUS | Status: DC
  Administered 2018-03-11: 06:00:00 via INTRAVENOUS

## 2018-03-11 MED ORDER — ACETAMINOPHEN 160 MG/5ML PO SOLN
650.0000 mg | Freq: Once | ORAL | Status: AC
  Administered 2018-03-11: 650 mg

## 2018-03-11 MED ORDER — ASPIRIN 81 MG PO CHEW: 324.0000 mg | CHEWABLE_TABLET | Freq: Every day | ORAL | Status: DC

## 2018-03-11 MED ORDER — ALBUMIN HUMAN 5 % IV SOLN
250.0000 mL | INTRAVENOUS | Status: AC | PRN
  Administered 2018-03-11 (×2): 12.5 g via INTRAVENOUS
  Filled 2018-03-11: qty 250

## 2018-03-11 NOTE — Progress Notes (Signed)
T[facility] BRIEF SICU PROGRESS NOTE  1 Day Post-Op  S/P Procedure(s) (LRB): REPAIR OF ASCENDING AORTIC DISECTION USING STRAIGHT HEMASHIELD PLATINUM VASCULAR GRAFT; OPEN HEMIARCH DISTAL ANASTOMOSIS (N/A) RESUSPENSION OF THE NATIVE AORTIC VALVE (N/A)   Patient's family could not be found anywhere on the hospital premises.  I attempted to telephone the patient's sister using the number listed in Epic - there was no answer.  Plan: Will update the patient's family regarding his condition during the day if they are available.  Purcell Nails, MD 03/11/2018 3:20 AM

## 2018-03-11 NOTE — Progress Notes (Signed)
MD Cornelius Moras updated on patient's chest tube output and hemodynamic status.  MD Cornelius Moras advised okay to proceed with rapid wean protocol.

## 2018-03-11 NOTE — Progress Notes (Signed)
[address]       [place] [address]             [phone]        CARDIOTHORACIC SURGERY PROGRESS NOTE   R1 Day Post-Op Procedure(s) (LRB): REPAIR OF ASCENDING AORTIC DISECTION USING STRAIGHT HEMASHIELD PLATINUM VASCULAR GRAFT; OPEN HEMIARCH DISTAL ANASTOMOSIS (N/A) RESUSPENSION OF THE NATIVE AORTIC VALVE (N/A)  Subjective: Sedated on vent  Objective: Vital signs: BP Readings from Last 1 Encounters:  03/11/18 109/72   Pulse Readings from Last 1 Encounters:  03/11/18 89   Resp Readings from Last 1 Encounters:  03/11/18 16   Temp Readings from Last 1 Encounters:  03/11/18 99.3 F (37.4 C) (Core)    Hemodynamics: PAP: (28-41)/(8-16) 31/10 CO:  [2.6 L/min-3.8 L/min] 3.8 L/min CI:  [1.2 L/min/m2-1.7 L/min/m2] 1.7 L/min/m2  Physical Exam:  Rhythm:   sinus  Breath sounds: clear  Heart sounds:  RRR  Incisions:  Dressings dry, intact  Abdomen:  Soft, non-distended  Extremities:  Warm, well-perfused, Doppler + pulses LLE  Chest tubes:  Decreasing but significant volume sanguinous output, no air leak    Intake/Output from previous day: 10/15 0701 - 10/16 0700 In: 8664 [I.V.:4147.7; BJYNW:2956; IV Piggyback:1093.2] Out: 6168 [Urine:3575; Blood:1453; Chest Tube:1140] Intake/Output this shift: Total I/O In: 661.2 [Blood:661.2] Out: -   Lab Results:  CBC: Recent Labs    03/11/18 0317 03/11/18 0551 03/11/18 0606  WBC 14.3* 12.4*  --   HGB 11.1* 11.1* 11.2*  HCT 34.0* 33.5* 33.0*  PLT 132* 142*  --     BMET:  Recent Labs    03/10/18 1850  03/11/18 0551 03/11/18 0606  NA 135   < > 138 138  K 2.6*   < > 5.9* 5.9*  CL 100   < > 107 105  CO2 15*  --  24  --   GLUCOSE 198*   < > 128* 125*  BUN 13   < > 12 13  CREATININE 1.81*   < > 1.20 1.20  CALCIUM 9.3  --  7.2*  --    < > = values in this interval not displayed.     PT/INR:   Recent Labs    03/11/18 0551  LABPROT 17.1*  INR 1.41    CBG (last 3)  Recent Labs      03/11/18 0604 03/11/18 0711 03/11/18 0812  GLUCAP 122* 116* 126*    ABG    Component Value Date/Time   PHART 7.374 03/11/2018 0606   PCO2ART 40.3 03/11/2018 0606   PO2ART 161.0 (H) 03/11/2018 0606   HCO3 23.6 03/11/2018 0606   TCO2 25 03/11/2018 0606   TCO2 24 03/11/2018 0606   ACIDBASEDEF 2.0 03/11/2018 0606   O2SAT 99.0 03/11/2018 0606    CXR: PORTABLE CHEST 1 VIEW  COMPARISON:  03/11/2018  FINDINGS: Postoperative changes in the mediastinum. Endotracheal tube with tip measuring 4.9 cm above the carina. Right Swan-Ganz catheter with tip over the pulmonary outflow tract. Left chest tube and mediastinal drain. Enteric tube with tip in the left upper quadrant consistent with location in the upper stomach. Cardiac enlargement. No pulmonary vascular congestion. Atelectasis or infiltration in the left lung base. No pleural effusions. No pneumothorax. Surgical clips in the right axilla.  IMPRESSION: Appliances appear in satisfactory location. Cardiac enlargement. Atelectasis or infiltration in the left lung base.   Electronically Signed   By: Burman Nieves M.D.   On: 03/11/2018 03:30  EKG: NSR w/out acute ischemic changes     Assessment/Plan: S/P Procedure(s) (LRB): REPAIR OF ASCENDING AORTIC DISECTION USING STRAIGHT HEMASHIELD PLATINUM VASCULAR GRAFT; OPEN HEMIARCH DISTAL ANASTOMOSIS (N/A) RESUSPENSION OF THE NATIVE AORTIC VALVE (N/A)  Overall stable early postop Maintaining NSR w/ stable hemodynamics on very low dose Neo for BP support Chest tube output trending down Thrombocytopenia and coagulopathy persists but improving Expected post op acute blood loss anemia, mild   Will try low dose factor VII and monitor chest tube output closely  Wean vent once coagulopathy has resolved   Purcell Nails, MD 03/11/2018 9:22 AM

## 2018-03-11 NOTE — Progress Notes (Signed)
   VASCULAR SURGERY ASSESSMENT & PLAN:   The patient is status post repair of an ascending aortic dissection.  Preop CT scan showed involvement of the right carotid, celiac axis, superior mesenteric artery, and bilateral iliac arteries.  Hopefully now that he has been reperfused all these issues are resolved.  I am specifically following the left leg which had no Doppler flow preop but now has a reasonable dorsalis pedis and posterior tibial signal with the Doppler.  We will continue to follow.  SUBJECTIVE:   Sedated on the vent.  PHYSICAL EXAM:   Vitals:   03/11/18 0530 03/11/18 0545 03/11/18 0600 03/11/18 0607  BP:    94/76  Pulse: 89 89 89 88  Resp: 16 16 16 16   Temp: (!) 96.6 F (35.9 C) (!) 97.2 F (36.2 C) 97.7 F (36.5 C) 97.9 F (36.6 C)  TempSrc:      SpO2: 100% 100% 100% 100%  Weight:      Height:       Brisk femoral signal on the left with the Doppler.  Reasonable dorsalis pedis and posterior tibial signal with the Doppler on the left. Palpable right femoral pulse, dorsalis pedis, and posterior tibial pulses.  LABS:   Lab Results  Component Value Date   WBC 12.4 (H) 03/11/2018   HGB 11.2 (L) 03/11/2018   HCT 33.0 (L) 03/11/2018   MCV 89.3 03/11/2018   PLT 142 (L) 03/11/2018   Lab Results  Component Value Date   CREATININE 1.20 03/11/2018   Lab Results  Component Value Date   INR 1.41 03/11/2018   CBG (last 3)  Recent Labs    03/11/18 0357 03/11/18 0453 03/11/18 0604  GLUCAP 148* 146* 122*    PROBLEM LIST:    Principal Problem:   S/P aortic dissection repair Active Problems:   Essential hypertension   Acute thoracic aortic dissection (HCC)   Uncontrolled hypertension   CURRENT MEDS:   . sodium chloride   Intravenous Once  . acetaminophen  1,000 mg Oral Q6H   Or  . acetaminophen (TYLENOL) oral liquid 160 mg/5 mL  1,000 mg Per Tube Q6H  . [START ON 03/12/2018] aspirin EC  325 mg Oral Daily   Or  . [START ON 03/12/2018] aspirin  324 mg  Per Tube Daily  . [START ON 03/12/2018] bisacodyl  10 mg Oral Daily   Or  . [START ON 03/12/2018] bisacodyl  10 mg Rectal Daily  . [START ON 03/12/2018] docusate sodium  200 mg Oral Daily  . insulin regular  0-10 Units Intravenous TID WC  . metoprolol tartrate  12.5 mg Oral BID   Or  . metoprolol tartrate  12.5 mg Per Tube BID  . [START ON 03/13/2018] pantoprazole  40 mg Oral Daily  . [START ON 03/12/2018] sodium chloride flush  3 mL Intravenous Q12H    Waverly Ferrari Beeper: [phone] Office: [phone] 03/11/2018

## 2018-03-11 NOTE — Procedures (Signed)
Extubation Procedure Note  Patient Details:   Name: Derrick Clayton DOB: March 20, 1981 MRN: [id number]   Airway Documentation:    Vent end date: 03/11/18 Vent end time: 1220   Evaluation  O2 sats: stable throughout Complications: No apparent complications Patient did tolerate procedure well. Bilateral Breath Sounds: Clear, Diminished   Yes   Patient extubated to 4L nasal cannula per protocol.  Positive cuff leak noted.  No evidence of stridor.  Patient able to speak post extubation.  Sats currently 97%.  NIF of -30, VC of 1.0L.  Incentive spirometry performed x3 with achieved goal of 500.  No complications noted.   Durwin Glaze 03/11/2018, 12:35 PM

## 2018-03-11 NOTE — Progress Notes (Signed)
RT note: Rapid wean protocol initiated at 11:15.

## 2018-03-11 NOTE — Progress Notes (Signed)
Patient ID: Derrick Clayton, male   DOB: [birth year], 37 y.o.   MRN: [id number] EVENING ROUNDS NOTE :     [address]       [place] [address]             [phone]                 1 Day Post-Op Procedure(s) (LRB): REPAIR OF ASCENDING AORTIC DISECTION USING STRAIGHT HEMASHIELD PLATINUM VASCULAR GRAFT; OPEN HEMIARCH DISTAL ANASTOMOSIS (N/A) RESUSPENSION OF THE NATIVE AORTIC VALVE (N/A)  Total Length of Stay:  LOS: 1 day  BP 137/87   Pulse (!) 108   Temp 100 F (37.8 C)   Resp (!) 37   Ht 6\' 1"  (1.854 m)   Wt 112.7 kg   SpO2 95%   BMI 32.78 kg/m   .Intake/Output      10/16 0701 - 10/17 0700   P.O. 220   I.V. (mL/kg) 1715.8 (15.2)   Blood 952.8   IV Piggyback 347.4   Total Intake(mL/kg) 3236.1 (28.7)   Urine (mL/kg/hr) 415 (0.3)   Emesis/NG output 200   Blood    Chest Tube 680   Total Output 1295   Net +1941.1         . albumin human Stopped (03/11/18 0700)  . cefUROXime (ZINACEF)  IV Stopped (03/11/18 0914)  . dexmedetomidine (PRECEDEX) IV infusion Stopped (03/11/18 1231)  . lactated ringers    . lactated ringers Stopped (03/11/18 0749)  . lactated ringers 20 mL/hr at 03/11/18 1900  . nitroGLYCERIN    . phenylephrine (NEO-SYNEPHRINE) Adult infusion 35 mcg/min (03/11/18 1900)     Lab Results  Component Value Date   WBC 13.5 (H) 03/11/2018   HGB 9.2 (L) 03/11/2018   HCT 27.0 (L) 03/11/2018   PLT 200 03/11/2018   GLUCOSE 115 (H) 03/11/2018   ALT 40 03/10/2018   AST 36 03/10/2018   NA 140 03/11/2018   K 4.2 03/11/2018   CL 106 03/11/2018   CREATININE 1.40 (H) 03/11/2018   BUN 17 03/11/2018   CO2 24 03/11/2018   INR 1.41 03/11/2018   Stable now, neuro intact Lower extremities neuro intact, noted leg weakness pre op especially left leg   Delight Ovens MD  Beeper [phone] Office [phone] 03/11/2018 7:45 PM

## 2018-03-11 NOTE — Progress Notes (Signed)
Progress Note  Patient Name: Derrick Clayton Date of Encounter: 03/11/2018  Primary Cardiologist: Dr Bronson Ing  Subjective   Intubated and sedated  Inpatient Medications    Scheduled Meds: . acetaminophen  1,000 mg Oral Q6H   Or  . acetaminophen (TYLENOL) oral liquid 160 mg/5 mL  1,000 mg Per Tube Q6H  . [START ON 03/12/2018] aspirin EC  325 mg Oral Daily   Or  . [START ON 03/12/2018] aspirin  324 mg Per Tube Daily  . [START ON 03/12/2018] bisacodyl  10 mg Oral Daily   Or  . [START ON 03/12/2018] bisacodyl  10 mg Rectal Daily  . chlorhexidine gluconate (MEDLINE KIT)  15 mL Mouth Rinse BID  . Chlorhexidine Gluconate Cloth  6 each Topical Daily  . [START ON 03/12/2018] docusate sodium  200 mg Oral Daily  . insulin regular  0-10 Units Intravenous TID WC  . mouth rinse  15 mL Mouth Rinse 10 times per day  . metoprolol tartrate  12.5 mg Oral BID   Or  . metoprolol tartrate  12.5 mg Per Tube BID  . [START ON 03/13/2018] pantoprazole  40 mg Oral Daily  . sodium chloride flush  10-40 mL Intracatheter Q12H  . [START ON 03/12/2018] sodium chloride flush  3 mL Intravenous Q12H   Continuous Infusions: . sodium chloride 20 mL/hr at 03/11/18 0700  . sodium chloride 100 mL/hr at 03/11/18 0700  . [START ON 03/12/2018] sodium chloride    . sodium chloride 10 mL/hr at 03/11/18 0253  . albumin human 12.5 g (03/11/18 0656)  . cefUROXime (ZINACEF)  IV    . dexmedetomidine (PRECEDEX) IV infusion 0.7 mcg/kg/hr (03/11/18 0700)  . famotidine (PEPCID) IV Stopped (03/11/18 0507)  . insulin 2.5 mL/hr at 03/11/18 0700  . lactated ringers    . lactated ringers 20 mL/hr at 03/11/18 0700  . lactated ringers 20 mL/hr at 03/11/18 0700  . magnesium sulfate 20 mL/hr at 03/11/18 0700  . nitroGLYCERIN    . phenylephrine (NEO-SYNEPHRINE) Adult infusion 35 mcg/min (03/11/18 0700)  . vancomycin     PRN Meds: sodium chloride, albumin human, labetalol, lactated ringers, metoprolol tartrate, midazolam,  morphine injection, morphine injection, ondansetron (ZOFRAN) IV, oxyCODONE, sodium chloride flush, [START ON 03/12/2018] sodium chloride flush, traMADol   Vital Signs    Vitals:   03/11/18 0757 03/11/18 0807 03/11/18 0823 03/11/18 0837  BP: 98/70 109/72    Pulse: 90 90 89 89  Resp: (!) 21 (!) _0 Temp:  99.3 F (37.4 C) 99.3 F (37.4 C) 99.5 F (37.5 C)  TempSrc:  Core Core Core  SpO2: 100% 100% 100% 100%  Weight:      Height:        Intake/Output Summary (Last 24 hours) at 03/11/2018 0844 Last data filed at 03/11/2018 0823 Gross per 24 hour  Intake 8929.3 ml  Output 6168 ml  Net 2761.3 ml   Filed Weights   03/10/18 1850 03/11/18 0500  Weight: 101.6 kg 112.7 kg    Telemetry    Sinus with PACs- Personally Reviewed   Physical Exam   GEN: Intubated and sedated Cardiac: RRR Respiratory: CTA anteriorly GI: Tubes in place MS: No edema; pedal pulses dopplerable Neuro:  sedated  Labs    Chemistry Recent Labs  Lab 03/10/18 1850  03/11/18 0122  03/11/18 0314 03/11/18 0551 03/11/18 0606  NA 135   < > 139   < > 139 138 138  K 2.6*   < > 4.4   < >  5.8* 5.9* 5.9*  CL 100   < > 102  --   --  107 105  CO2 15*  --   --   --   --  24  --   GLUCOSE 198*   < > 170*   < > 114* 128* 125*  BUN 13   < > 12  --   --  12 13  CREATININE 1.81*   < > 1.10  --   --  1.20 1.20  CALCIUM 9.3  --   --   --   --  7.2*  --   PROT 6.9  --   --   --   --   --   --   ALBUMIN 3.9  --   --   --   --   --   --   AST 36  --   --   --   --   --   --   ALT 40  --   --   --   --   --   --   ALKPHOS 53  --   --   --   --   --   --   BILITOT 0.6  --   --   --   --   --   --   GFRNONAA 46*  --   --   --   --  >60  --   GFRAA 53*  --   --   --   --  >60  --   ANIONGAP 20*  --   --   --   --  7  --    < > = values in this interval not displayed.     Hematology Recent Labs  Lab 03/10/18 1850  03/11/18 0140  03/11/18 0317 03/11/18 0551 03/11/18 0606  WBC 9.5  --   --   --  14.3*  12.4*  --   RBC 4.70  --   --   --  3.73* 3.75*  --   HGB 14.2   < >  --    < > 11.1* 11.1* 11.2*  HCT 42.6   < >  --    < > 34.0* 33.5* 33.0*  MCV 90.6  --   --   --  91.2 89.3  --   MCH 30.2  --   --   --  29.8 29.6  --   MCHC 33.3  --   --   --  32.6 33.1  --   RDW 12.6  --   --   --  13.0 13.7  --   PLT 269   < > 103*  --  132* 142*  --    < > = values in this interval not displayed.    Cardiac EnzymesNo results for input(s): TROPONINI in the last 168 hours.  Recent Labs  Lab 03/10/18 1907  TROPIPOC 0.01      DDimer  Recent Labs  Lab 03/11/18 0140  DDIMER 5.69*     Radiology    Dg Chest Port 1 View  Result Date: 03/11/2018 CLINICAL DATA:  Postoperative atelectasis. EXAM: PORTABLE CHEST 1 VIEW COMPARISON:  03/11/2018 FINDINGS: Postoperative changes in the mediastinum. Endotracheal tube with tip measuring 4.9 cm above the carina. Right Swan-Ganz catheter with tip over the pulmonary outflow tract. Left chest tube and mediastinal drain. Enteric tube with tip in the left upper quadrant consistent with location in the upper  stomach. Cardiac enlargement. No pulmonary vascular congestion. Atelectasis or infiltration in the left lung base. No pleural effusions. No pneumothorax. Surgical clips in the right axilla. IMPRESSION: Appliances appear in satisfactory location. Cardiac enlargement. Atelectasis or infiltration in the left lung base. Electronically Signed   By: Lucienne Capers M.D.   On: 03/11/2018 03:30   Dg Chest Portable 1 View  Result Date: 03/11/2018 CLINICAL DATA:  Chest pain postop EXAM: PORTABLE CHEST 1 VIEW COMPARISON:  CT  03/10/2018 FINDINGS: Interval intubation, endotracheal tube tip is about 4.5 cm superior to the carina. Interval sternotomy with placement of mediastinal and left lower chest drainage catheters. Right IJ Swan-Ganz catheter tip overlying the pulmonary trunk. Low lung volumes. Non inclusion of the right CP angle. Atelectasis at the left base.  Cardiomediastinal silhouette within normal limits. No pneumothorax. IMPRESSION: 1. Interval placement of support lines and tubes as above. 2. Low lung volumes with mild atelectasis at the left base. No unexpected radiopaque foreign bodies are seen. Electronically Signed   By: Donavan Foil M.D.   On: 03/11/2018 02:41   Ct Angio Chest/abd/pel For Dissection W And/or Wo Contrast  Result Date: 03/10/2018 CLINICAL DATA:  Hypertension, chest and back pain EXAM: CT ANGIOGRAPHY CHEST, ABDOMEN AND PELVIS TECHNIQUE: Multidetector CT imaging through the chest, abdomen and pelvis was performed using the standard protocol during bolus administration of intravenous contrast. Multiplanar reconstructed images and MIPs were obtained and reviewed to evaluate the vascular anatomy. CONTRAST:  160m ISOVUE-370 IOPAMIDOL (ISOVUE-370) INJECTION 76% IV COMPARISON:  None. 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 No intramural hematoma on precontrast imaging. Following contrast, type a aortic dissection identified extending from the proximal ascending thoracic aorta from adjacent to the aortic valve through the ascending aorta, aortic arch, and descending thoracic aorta into abdomen. Bovine arch anatomy. Dissection extends cranially into the common origin of the brachiocephalic artery/LEFT common carotid artery into the brachiocephalic artery and then into the proximal RIGHT common carotid artery. Only a thin rim of contrast enhancement is seen within the RIGHT common carotid artery, likely native lumen, question nonenhancing lumen versus thrombosis. BILATERAL subclavian and LEFT common carotid arteries are patent and enhancing. Pulmonary arteries patent. No pericardial effusion. Mediastinum/Nodes: Tiny hiatal hernia. No thoracic adenopathy. Base of cervical region normal appearance. Esophagus normal appearance. Lungs/Pleura: Lungs clear. No  infiltrate, pleural effusion or pneumothorax. Small bleb subpleural anterior LEFT upper lobe adjacent to anterior junction line. Musculoskeletal: No acute osseous findings. Review of the MIP images confirms the above findings. CTA ABDOMEN AND PELVIS FINDINGS VASCULAR Aorta: Aortic dissection extends through the descending thoracic aorta through the aortic bifurcation into the common iliac arteries bilaterally. Aorta normal caliber. Scattered atherosclerotic calcifications aorta. Celiac: Arises at the dissection flap, with marked narrowing of the origin. SMA: Arises at the dissection flap, with marked narrowing of the origin. Renals: RIGHT renal artery is patent and arises from the small true lumen. LEFT renal artery is patent and arises from the larger false lumen. Probable tiny accessory artery to the upper pole the LEFT kidney. IMA: Patent, arises from the smaller true lumen, narrowed at origin. Inflow: Scattered atherosclerotic calcifications common iliac arteries. Dissection extends into the RIGHT common iliac artery but not down the external iliac artery; RIGHT internal and external iliac arteries appear patent. Dissection extends down the LEFT common iliac artery into the common iliac bifurcation. Marked narrowing of the proximal LEFT internal iliac artery with a thin patent rim of contrast  identified in a diminutive true lumen adjacent to a larger non opacifying false lumen lumen. LEFT external iliac artery is unopacified throughout its course question nonenhancing versus thrombosed and reconstitutes distally at the common femoral level. Veins: Suboptimally assessed on CTA imaging Review of the MIP images confirms the above findings. NON-VASCULAR Hepatobiliary: Gallbladder and liver normal appearance Pancreas: Normal appearance Spleen: Normal appearance Adrenals/Urinary Tract: Adrenal glands normal appearance. Patchiness of the nephrograms bilaterally especially upper pole LEFT kidney question due to impaired  flow in the tiny accessory artery to the upper pole. Mild diffuse patchiness of the RIGHT nephrogram is seen more diffusely suggesting hypoperfusion. Stomach/Bowel: Fluid-filled bowel loop in RIGHT pelvis question cecum. Small hiatal hernia. Stomach and remaining bowel loops unremarkable. Lymphatic: No adenopathy. Reproductive: Unremarkable prostate and seminal vesicles Other: No free air or free fluid. Tiny umbilical hernia containing fat. No acute inflammatory process. Musculoskeletal: No acute osseous findings. Review of the MIP images confirms the above findings. 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 Dissection extends into the bovine arch, up the brachiocephalic artery and into the RIGHT common carotid artery, which demonstrate only a tiny rim of enhancing native lumen with majority of vessel non-opacified question noncommunicating versus thrombosed. Marked narrowing of the origins of the celiac and superior mesenteric arteries which arise at the intimal flap junction with the small anterior native lumen. Patchiness of BILATERAL nephrograms consistent with a degree of hypoperfusion/vascular impairment in both kidneys, more diffusely on the RIGHT and focally at the upper pole of the LEFT kidney. Dissection extends into the RIGHT common iliac artery but not beyond. Dissection extends into the LEFT common iliac artery into its bifurcation and then into the LEFT internal iliac artery which shows only a thin rim of residual enhancement of the native lumen; no enhancement is seen within the remaining portion of the LEFT internal iliac lumen. No enhancement is identified within the LEFT external iliac artery, question noncommunicating versus thrombosed. Findings discussed with ER PA Claudia at approximately 1940 hrs and with Dr. Dayna Barker at 2000 hours  on 03/10/2018. Electronically Signed   By: Lavonia Dana M.D.   On: 03/10/2018 20:08    Patient Profile     37 y.o. male with PMH HTN admitted with Type A aortic dissection.  Assessment & Plan    1 s/p repair type 1 aortic dissection-remains intubated and sedated this AM. Dissection also involved right carotid, celiac axis, SMA and bilateral iliac arteries; DP pulses dopplerable; follow neuro exam; vascular surgery also following.  2 HTN-will need aggressive BP management as he recovers. Will need education on compliance.  3 Hyperkalemia-no further supplementation; follow.  For questions or updates, please contact [facility] Please consult [email or web address] for contact info under        Signed, Kirk Ruths, MD  03/11/2018, 8:44 AM

## 2018-03-11 NOTE — Brief Op Note (Signed)
03/10/2018  12:50 AM  PATIENT:  Derrick Clayton  37 y.o. male  PRE-OPERATIVE DIAGNOSIS:  ascending aortic dissection  POST-OPERATIVE DIAGNOSIS:   ascending aortic dissection  PROCEDURE:  Procedure(s): REPAIR ACUTE ASCENDING AORTIC DISECTION USING HEMASHIELD PLATINUM VASCULAR GRAFT (N/A)  SURGEON:  Surgeon(s) and Role:    Purcell Nails, MD - Primary  PHYSICIAN ASSISTANT:  Jari Favre, PA-C   ANESTHESIA:   general  EBL:  1400    BLOOD ADMINISTERED:none  DRAINS: ROUTINE   LOCAL MEDICATIONS USED:  NONE  SPECIMEN:  Source of Specimen:  PORTION OF THE AORTA  DISPOSITION OF SPECIMEN:  PATHOLOGY  COUNTS:  YES  DICTATION: .Dragon Dictation  PLAN OF CARE: Admit to inpatient   PATIENT DISPOSITION:  ICU - intubated and hemodynamically stable.   Delay start of Pharmacological VTE agent (>24hrs) due to surgical blood loss or risk of bleeding: yes

## 2018-03-11 NOTE — Transfer of Care (Signed)
Immediate Anesthesia Transfer of Care Note  Patient: Derrick Clayton  Procedure(s) Performed: REPAIR OF ASCENDING AORTIC DISECTION USING STRAIGHT HEMASHIELD PLATINUM VASCULAR GRAFT; OPEN HEMIARCH DISTAL ANASTOMOSIS (N/A Chest) RESUSPENSION OF THE NATIVE AORTIC VALVE (N/A )  Patient Location: SICU  Anesthesia Type:General  Level of Consciousness: sedated and Patient remains intubated per anesthesia plan  Airway & Oxygen Therapy: Patient remains intubated per anesthesia plan and Patient placed on Ventilator (see vital sign flow sheet for setting)  Post-op Assessment: Report given to RN and Post -op Vital signs reviewed and stable  Post vital signs: Reviewed and stable  Last Vitals:  Vitals Value Taken Time  BP    Temp    Pulse    Resp    SpO2      Last Pain:  Vitals:   03/10/18 1934  PainSc: 10-Worst pain ever         Complications: No apparent anesthesia complications

## 2018-03-11 NOTE — Consult Note (Signed)
Patient name: Derrick Clayton MRN: [id number] DOB: 1981/04/06 Sex: male  REASON FOR CONSULT: Diminished Doppler signals left foot.  The consult is requested by Dr. Holley Raring  HPI: Derrick Clayton is a 37 y.o. male who presented with an ascending aortic dissection.  Preoperatively he had no Doppler flow to the left foot.  He just finished repair of his acute ascending aortic dissection and now has good Doppler signal in the left groin and a dopplerable dorsalis pedis and posterior tibial pulse.  Dr. Cornelius Moras requested that we follow along and keep an eye on the left leg to be sure that this continued to improve.  No further history can be obtained from the patient as he just finished surgery and is intubated.  Current Facility-Administered Medications  Medication Dose Route Frequency Provider Last Rate Last Dose  . 0.45 % sodium chloride infusion   Intravenous Continuous PRN Purcell Nails, MD      . 0.9 %  sodium chloride infusion (Manually program via Guardrails IV Fluids)   Intravenous Once Purcell Nails, MD      . 0.9 %  sodium chloride infusion   Intravenous Continuous Purcell Nails, MD      . Melene Muller ON 03/12/2018] 0.9 %  sodium chloride infusion  250 mL Intravenous Continuous Purcell Nails, MD      . 0.9 %  sodium chloride infusion   Intravenous Continuous Purcell Nails, MD      . Melene Muller ON 03/12/2018] acetaminophen (TYLENOL) tablet 1,000 mg  1,000 mg Oral Q6H Purcell Nails, MD       Or  . Melene Muller ON 03/12/2018] acetaminophen (TYLENOL) solution 1,000 mg  1,000 mg Per Tube Q6H Purcell Nails, MD      . acetaminophen (TYLENOL) solution 650 mg  650 mg Per Tube Once Purcell Nails, MD       Or  . acetaminophen (TYLENOL) suppository 650 mg  650 mg Rectal Once Purcell Nails, MD      . albumin human 5 % solution 12.5 g  250 mL Intravenous Q15 min PRN Purcell Nails, MD      . Melene Muller ON 03/12/2018] aspirin EC tablet 325 mg  325 mg Oral Daily Purcell Nails, MD       Or  .  Melene Muller ON 03/12/2018] aspirin chewable tablet 324 mg  324 mg Per Tube Daily Purcell Nails, MD      . Melene Muller ON 03/12/2018] bisacodyl (DULCOLAX) EC tablet 10 mg  10 mg Oral Daily Purcell Nails, MD       Or  . Melene Muller ON 03/12/2018] bisacodyl (DULCOLAX) suppository 10 mg  10 mg Rectal Daily Purcell Nails, MD      . cefUROXime (ZINACEF) 1.5 g in sodium chloride 0.9 % 100 mL IVPB  1.5 g Intravenous Q12H Purcell Nails, MD      . chlorhexidine (PERIDEX) 0.12 % solution 15 mL  15 mL Mouth/Throat NOW Purcell Nails, MD      . dexmedetomidine (PRECEDEX) 200 MCG/50ML (4 mcg/mL) infusion  0-0.7 mcg/kg/hr Intravenous Continuous Purcell Nails, MD      . Melene Muller ON 03/12/2018] docusate sodium (COLACE) capsule 200 mg  200 mg Oral Daily Purcell Nails, MD      . famotidine (PEPCID) IVPB 20 mg premix  20 mg Intravenous Q12H Tressie Stalker H, MD      . insulin regular bolus via infusion 0-10 Units  0-10 Units Intravenous  TID WC Purcell Nails, MD      . insulin regular, human (MYXREDLIN) 100 units/ 100 mL infusion   Intravenous Continuous Purcell Nails, MD      . labetalol (NORMODYNE,TRANDATE) injection 10 mg  10 mg Intravenous Q10 min PRN Purcell Nails, MD      . lactated ringers infusion 500 mL  500 mL Intravenous Once PRN Purcell Nails, MD      . lactated ringers infusion   Intravenous Continuous Purcell Nails, MD      . lactated ringers infusion   Intravenous Continuous Purcell Nails, MD      . magnesium sulfate IVPB 4 g 100 mL  4 g Intravenous Once Purcell Nails, MD      . metoprolol tartrate (LOPRESSOR) tablet 12.5 mg  12.5 mg Oral BID Purcell Nails, MD       Or  . metoprolol tartrate (LOPRESSOR) 25 mg/10 mL oral suspension 12.5 mg  12.5 mg Per Tube BID Purcell Nails, MD      . metoprolol tartrate (LOPRESSOR) injection 2.5-5 mg  2.5-5 mg Intravenous Q2H PRN Purcell Nails, MD      . midazolam (VERSED) injection 2 mg  2 mg Intravenous Q1H PRN Purcell Nails, MD       . morphine 2 MG/ML injection 1-2 mg  1-2 mg Intravenous Q1H PRN Purcell Nails, MD      . morphine 2 MG/ML injection 1-4 mg  1-4 mg Intravenous Q1H PRN Purcell Nails, MD      . nitroGLYCERIN 50 mg in dextrose 5 % 250 mL (0.2 mg/mL) infusion  0-100 mcg/min Intravenous Titrated Purcell Nails, MD      . ondansetron [facility]) injection 4 mg  4 mg Intravenous Q6H PRN Purcell Nails, MD      . oxyCODONE (Oxy IR/ROXICODONE) immediate release tablet 5-10 mg  5-10 mg Oral Q3H PRN Purcell Nails, MD      . Melene Muller ON 03/13/2018] pantoprazole (PROTONIX) EC tablet 40 mg  40 mg Oral Daily Tressie Stalker H, MD      . phenylephrine (NEOSYNEPHRINE) 20-0.9 MG/250ML-% infusion  0-100 mcg/min Intravenous Titrated Purcell Nails, MD      . potassium chloride 10 mEq in 50 mL *CENTRAL LINE* IVPB  10 mEq Intravenous Q1 Hr x 3 Purcell Nails, MD      . Melene Muller ON 03/12/2018] sodium chloride flush (NS) 0.9 % injection 3 mL  3 mL Intravenous Q12H Purcell Nails, MD      . Melene Muller ON 03/12/2018] sodium chloride flush (NS) 0.9 % injection 3 mL  3 mL Intravenous PRN Purcell Nails, MD      . traMADol Janean Sark) tablet 50-100 mg  50-100 mg Oral Q4H PRN Purcell Nails, MD      . vancomycin (VANCOCIN) IVPB 1000 mg/200 mL premix  1,000 mg Intravenous Once Purcell Nails, MD       Current Outpatient Medications  Medication Sig Dispense Refill  . acetaminophen (TYLENOL) 500 MG tablet Take 500 mg by mouth every 6 (six) hours as needed (pain).    Marland Kitchen acyclovir (ZOVIRAX) 800 MG tablet Take 0.5 tablets (400 mg total) by mouth 5 (five) times daily. 50 tablet 0  . amLODipine (NORVASC) 5 MG tablet Take 1 tablet (5 mg total) by mouth daily. 30 tablet 1  . lisinopril (PRINIVIL,ZESTRIL) 10 MG tablet Take 10 mg by mouth daily.    Marland Kitchen  methocarbamol (ROBAXIN) 500 MG tablet Take 1 tablet (500 mg total) by mouth every 8 (eight) hours as needed for muscle spasms. May cause drowsiness 20 tablet 0  . Metoprolol Tartrate (LOPRESSOR  PO) Take by mouth.     Facility-Administered Medications Ordered in Other Encounters  Medication Dose Route Frequency Provider Last Rate Last Dose  . albumin human 5 % solution    Continuous PRN Claudina Lick, CRNA   Stopped at 03/11/18 0159  . artificial tears (LACRILUBE) ophthalmic ointment    Anesthesia Intra-op Claudina Lick, CRNA   1 application at 03/10/18 2101  . etomidate (AMIDATE) injection    Anesthesia Intra-op Claudina Lick, CRNA   20 mg at 03/10/18 2327  . fentaNYL (SUBLIMAZE) injection   Intravenous Anesthesia Intra-op Elliot Dally, CRNA   150 mcg at 03/11/18 0120  . heparin injection   Intravenous Anesthesia Intra-op Claudina Lick, CRNA   37,000 Units at 03/10/18 2209  . hydrocortisone sodium succinate (SOLU-CORTEF) 100 MG injection    Anesthesia Intra-op Claudina Lick, CRNA   250 mg at 03/10/18 2327  . lactated ringers infusion    Continuous PRN Claudina Lick, CRNA      . lactated ringers infusion    Continuous PRN Claudina Lick, CRNA      . lactated ringers infusion    Continuous PRN Claudina Lick, CRNA      . lactated ringers infusion    Continuous PRN Claudina Lick, CRNA      . midazolam (VERSED) 5 MG/5ML injection   Intravenous Anesthesia Intra-op Elliot Dally, CRNA   2 mg at 03/11/18 0123  . phenylephrine (NEO-SYNEPHRINE) 0.08 mg/mL in sodium chloride 0.9 % 250 mL infusion    Continuous PRN Claudina Lick, CRNA 15 mL/hr at 03/11/18 0125 20 mcg/min at 03/11/18 0125  . protamine injection   Intravenous Anesthesia Intra-op Claudina Lick, CRNA   370 mg at 03/11/18 0051  . rocuronium bromide 10 mg/mL (PF) syringe   Intravenous Anesthesia Intra-op Elliot Dally, CRNA   50 mg at 03/11/18 0203  . sodium bicarbonate injection    Anesthesia Intra-op Claudina Lick, CRNA   50 mEq at 03/11/18 0126  . succinylcholine (ANECTINE) syringe   Intravenous Anesthesia Intra-op Elliot Dally, CRNA   120 mg at 03/10/18 2057  . vecuronium  (NORCURON) injection    Anesthesia Intra-op Claudina Lick, CRNA   5 mg at 03/11/18 0031   REVIEW OF SYSTEMS: Cannot be obtained  PHYSICAL EXAM: Vitals:   03/10/18 1950 03/10/18 1955 03/10/18 2005 03/10/18 2010  BP: (!) 133/49 (!) 135/53 (!) 116/55 (!) 118/51  Pulse: 72 71 70 73  Resp: 19 (!) 23 (!) 23 (!) 22  SpO2: 94% 95% 100% 100%  Weight:      Height:       GENERAL: The patient is in the operating room intubated.   The vital signs are documented above. CARDIOVASCULAR: There is a regular rate and rhythm. PULMONARY: There is good air exchange bilaterally. VASCULAR: On the right side, he has a palpable femoral, dorsalis pedis, and posterior tibial pulse. On the left side he has a brisk femoral signal with a monophasic dorsalis pedis and posterior tibial signal.  DATA:  CT ANGIOGRAM CHEST ABDOMEN PELVIS: I have reviewed his CT angiogram.  This shows a type A aortic dissection extending from near the aortic valve through the ascending thoracic aorta, aortic arch, descending thoracic aorta, abdominal aorta and through the aortic  bifurcation.  The patient has a bovine arch   MEDICAL ISSUES:  TYPE A AORTIC DISSECTION: This patient underwent successful repair of a type aortic dissection.  The dissection involved his right carotid, celiac axis, superior mesenteric artery, and bilateral iliac arteries.  Now that the repair has been fixed he has excellent perfusion on the right foot and reasonable perfusion on the left.  We will follow this closely.  We will also have to follow his neurologic status and abdominal exam given the extent of the dissection.  Vascular surgery will follow.  Waverly [facility] Beeper: [phone]

## 2018-03-11 NOTE — H&P (Signed)
[address]       [place] [address]             [phone]          CARDIOTHORACIC SURGERY HISTORY AND PHYSICAL EXAM  PCP is Patient, No Pcp Per Referring Provider is Mesner, Barbara Cower, MD  Reason for consultation:  Acute Aortic Dissection  HPI:  Patient is a 37 year old African-American male with history of hypertension who developed sudden onset of severe substernal chest pain radiating to the neck and left lower extremity pain this evening.  Pain quickly became extremely severe prompting call to EMS.  The patient was brought directly to the emergency room where he was notably severely hypertensive.  CT angiogram of the chest and abdomen reveals acute type a aortic dissection.  The patient is married and lives locally in [place].  He states that he has been taking medications for blood pressure for several years but he ran out of his medications several days ago.  He has not seen a physician in several years.  He denies any previous history of similar symptoms.  He reports that the pain is extremely severe and radiates to the neck.  He also has severe pain in his left lower leg.  He has mild shortness of breath.  Prior to this evening he was in his usual state of health and without complaints.      Past Medical History:  Diagnosis Date  . Hypertension          Past Surgical History:  Procedure Laterality Date  . FOOT SURGERY           Family History  Problem Relation Age of Onset  . Cancer Mother   . Heart failure Father     Social History        Socioeconomic History  . Marital status: Single    Spouse name: Not on file  . Number of children: Not on file  . Years of education: Not on file  . Highest education level: Not on file  Occupational History  . Not on file  Social Needs  . Financial resource strain: Not on file  . Food insecurity:    Worry: Not on file    Inability: Not on file  . Transportation needs:      Medical: Not on file    Non-medical: Not on file  Tobacco Use  . Smoking status: Current Every Day Smoker    Packs/day: 0.00    Types: Cigarettes  . Smokeless tobacco: Never Used  Substance and Sexual Activity  . Alcohol use: Yes    Comment: 3-4 days a week  . Drug use: No  . Sexual activity: Not on file  Lifestyle  . Physical activity:    Days per week: Not on file    Minutes per session: Not on file  . Stress: Not on file  Relationships  . Social connections:    Talks on phone: Not on file    Gets together: Not on file    Attends religious service: Not on file    Active member of club or organization: Not on file    Attends meetings of clubs or organizations: Not on file    Relationship status: Not on file  . Intimate partner violence:    Fear of current or ex partner: Not on file    Emotionally abused: Not on file    Physically abused: Not on file    Forced  sexual activity: Not on file  Other Topics Concern  . Not on file  Social History Narrative  . Not on file           Prior to Admission medications   Medication Sig Start Date End Date Taking? Authorizing Provider  acetaminophen (TYLENOL) 500 MG tablet Take 500 mg by mouth every 6 (six) hours as needed (pain).    [provider]  acyclovir (ZOVIRAX) 800 MG tablet Take 0.5 tablets (400 mg total) by mouth 5 (five) times daily. 11/16/13   Palumbo, April, MD  amLODipine (NORVASC) 5 MG tablet Take 1 tablet (5 mg total) by mouth daily. 03/24/17   Georgetta Haber, NP  lisinopril (PRINIVIL,ZESTRIL) 10 MG tablet Take 10 mg by mouth daily.    [provider]  methocarbamol (ROBAXIN) 500 MG tablet Take 1 tablet (500 mg total) by mouth every 8 (eight) hours as needed for muscle spasms. May cause drowsiness 03/24/17   Linus Mako B, NP  Metoprolol Tartrate (LOPRESSOR PO) Take by mouth.    [provider]    No current facility-administered  medications for this encounter.          Current Outpatient Medications  Medication Sig Dispense Refill  . acetaminophen (TYLENOL) 500 MG tablet Take 500 mg by mouth every 6 (six) hours as needed (pain).    Marland Kitchen acyclovir (ZOVIRAX) 800 MG tablet Take 0.5 tablets (400 mg total) by mouth 5 (five) times daily. 50 tablet 0  . amLODipine (NORVASC) 5 MG tablet Take 1 tablet (5 mg total) by mouth daily. 30 tablet 1  . lisinopril (PRINIVIL,ZESTRIL) 10 MG tablet Take 10 mg by mouth daily.    . methocarbamol (ROBAXIN) 500 MG tablet Take 1 tablet (500 mg total) by mouth every 8 (eight) hours as needed for muscle spasms. May cause drowsiness 20 tablet 0  . Metoprolol Tartrate (LOPRESSOR PO) Take by mouth.      No Known Allergies    Review of Systems:  Per HPI.  Remainder non-contributory                          Physical Exam:              BP (!) 124/47   Pulse 73   Resp 16   Ht 6\' 1"  (1.854 m)   Wt 101.6 kg   SpO2 92%   BMI 29.55 kg/m              General:                      WDWN AA male in obvious distress             HEENT:                       Unremarkable              Neck:                           no JVD, no bruits, no adenopathy              Chest:                          clear to auscultation, symmetrical breath sounds, no wheezes, no rhonchi  CV:                              RRR, no murmur              Abdomen:                    soft, non-tender, no masses              Extremities:                 warm, well-perfused, pulses diminished left arm and left lower leg, no lower extremity edema             Rectal/GU                   Deferred             Neuro:                         Grossly non-focal and symmetrical throughout             Skin:                            Clean and dry, no rashes, no breakdown  Diagnostic Tests:  Lab Results: RecentLabs(last2labs)  Recent Labs    03/10/18 1850 03/10/18 1908  WBC 9.5  --   HGB 14.2 15.0    HCT 42.6 44.0  PLT 269  --      BMET:  RecentLabs(last2labs)  Recent Labs    03/10/18 1908  NA 136  K 2.8*  CL 100  GLUCOSE 195*  BUN 14  CREATININE 1.70*      CBG (last 3)  RecentLabs(last2labs)  No results for input(s): GLUCAP in the last 72 hours.   PT/INR:   RecentLabs(last2labs)  No results for input(s): LABPROT, INR in the last 72 hours.    CTA:    CT ANGIOGRAPHY CHEST, ABDOMEN AND PELVIS  TECHNIQUE: Multidetector CT imaging through the chest, abdomen and pelvis was performed using the standard protocol during bolus administration of intravenous contrast. Multiplanar reconstructed images and MIPs were obtained and reviewed to evaluate the vascular anatomy.  CONTRAST: ISOVUE-370 IOPAMIDOL (ISOVUE-370) INJECTION 76% IV  COMPARISON: None.  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 No intramural hematoma on precontrast imaging. Following contrast, type a aortic dissection identified extending from the proximal ascending thoracic aorta from adjacent to the aortic valve through the ascending aorta, aortic arch, and descending thoracic aorta into abdomen. Bovine arch anatomy. Dissection extends cranially into the common origin of the brachiocephalic artery/LEFT common carotid artery into the brachiocephalic artery and then into the proximal RIGHT common carotid artery. Only a thin rim of contrast enhancement is seen within the RIGHT common carotid artery, likely native lumen, question nonenhancing lumen versus thrombosis. BILATERAL subclavian and LEFT common carotid arteries are patent and enhancing. Pulmonary arteries patent. No pericardial effusion.  Mediastinum/Nodes: Tiny hiatal hernia. No thoracic adenopathy. Base of cervical region normal appearance. Esophagus normal appearance.  Lungs/Pleura: Lungs clear. No infiltrate, pleural  effusion or pneumothorax. Small bleb subpleural anterior LEFT upper lobe adjacent to anterior junction line.  Musculoskeletal: No acute osseous findings.  Review of the MIP images confirms the above findings.  CTA ABDOMEN AND PELVIS FINDINGS  VASCULAR  Aorta:  Aortic dissection extends through the descending thoracic aorta through the aortic bifurcation into the common iliac arteries bilaterally. Aorta normal caliber. Scattered atherosclerotic calcifications aorta.  Celiac: Arises at the dissection flap, with marked narrowing of the origin.  SMA: Arises at the dissection flap, with marked narrowing of the origin.  Renals: RIGHT renal artery is patent and arises from the small true lumen. LEFT renal artery is patent and arises from the larger false lumen. Probable tiny accessory artery to the upper pole the LEFT kidney.  IMA: Patent, arises from the smaller true lumen, narrowed at origin.  Inflow: Scattered atherosclerotic calcifications common iliac arteries. Dissection extends into the RIGHT common iliac artery but not down the external iliac artery; RIGHT internal and external iliac arteries appear patent. Dissection extends down the LEFT common iliac artery into the common iliac bifurcation. Marked narrowing of the proximal LEFT internal iliac artery with a thin patent rim of contrast identified in a diminutive true lumen adjacent to a larger non opacifying false lumen lumen. LEFT external iliac artery is unopacified throughout its course question nonenhancing versus thrombosed and reconstitutes distally at the common femoral level.  Veins: Suboptimally assessed on CTA imaging  Review of the MIP images confirms the above findings.  NON-VASCULAR  Hepatobiliary: Gallbladder and liver normal appearance  Pancreas: Normal appearance  Spleen: Normal appearance  Adrenals/Urinary Tract: Adrenal glands normal appearance. Patchiness of the nephrograms  bilaterally especially upper pole LEFT kidney question due to impaired flow in the tiny accessory artery to the upper pole. Mild diffuse patchiness of the RIGHT nephrogram is seen more diffusely suggesting hypoperfusion.  Stomach/Bowel: Fluid-filled bowel loop in RIGHT pelvis question cecum. Small hiatal hernia. Stomach and remaining bowel loops unremarkable.  Lymphatic: No adenopathy.  Reproductive: Unremarkable prostate and seminal vesicles  Other: No free air or free fluid. Tiny umbilical hernia containing fat. No acute inflammatory process.  Musculoskeletal: No acute osseous findings.  Review of the MIP images confirms the above findings.  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  Dissection extends into the bovine arch, up the brachiocephalic artery and into the RIGHT common carotid artery, which demonstrate only a tiny rim of enhancing native lumen with majority of vessel non-opacified question noncommunicating versus thrombosed.  Marked narrowing of the origins of the celiac and superior mesenteric arteries which arise at the intimal flap junction with the small anterior native lumen.  Patchiness of BILATERAL nephrograms consistent with a degree of hypoperfusion/vascular impairment in both kidneys, more diffusely on the RIGHT and focally at the upper pole of the LEFT kidney.  Dissection extends into the RIGHT common iliac artery but not beyond.  Dissection extends into the LEFT common iliac artery into its bifurcation and then into the LEFT internal iliac artery which shows only a thin rim of residual enhancement of the native lumen; no enhancement is seen within the remaining portion of the LEFT internal iliac lumen.  No enhancement is identified within the LEFT external iliac  artery, question noncommunicating versus thrombosed.  Findings discussed with ER PA Claudia at approximately 1940 hrs and with Dr. Clayborne Dana at 2000 hours on 03/10/2018.   Electronically Signed By: Ulyses Southward M.D. On: 03/10/2018 20:08    Impression:  Patient has acute type a aortic dissection.  I have personally reviewed the CT angiogram which confirmed the presence of an aortic dissection beginning in the aortic root  involving the entire thoracic and abdominal aorta.  The dissection extends into the cranial vessels including the innominate artery and the right common carotid artery.  The dissection also extends throughout the abdomen involves both iliac arteries.  Pulses are diminished in the left arm and left lower leg although both appear adequately perfused and are not frankly ischemic at present.   Plan:  I have discussed the nature of the patient's clinical problem briefly with the patient and his family in his room in the emergency department.  The need for emergent surgical repair is been discussed.  Poor prognosis with medical therapy has been discussed.  The patient accepts all potential associated risks of surgery and subsequent complications related to his aortic dissection and desires to proceed with surgery immediately.  All questions answered.   I spent in excess of 60 minutes during the conduct of this hospital consultation and >50% of this time involved direct face-to-face encounter for counseling and/or coordination of the patient's care.    Salvatore Decent. Cornelius Moras, MD 03/10/2018 7:51 PM

## 2018-03-11 NOTE — Progress Notes (Signed)
Pt arrived to 2H14 from OR at 02:53.

## 2018-03-11 NOTE — Anesthesia Postprocedure Evaluation (Signed)
Anesthesia Post Note  Patient: Derrick Clayton  Procedure(s) Performed: REPAIR OF ASCENDING AORTIC DISECTION USING STRAIGHT HEMASHIELD PLATINUM VASCULAR GRAFT; OPEN HEMIARCH DISTAL ANASTOMOSIS (N/A Chest) RESUSPENSION OF THE NATIVE AORTIC VALVE (N/A )     Patient location during evaluation: SICU Anesthesia Type: General Level of consciousness: patient remains intubated per anesthesia plan Pain management: pain level controlled Vital Signs Assessment: post-procedure vital signs reviewed and stable Respiratory status: patient remains intubated per anesthesia plan Cardiovascular status: stable Postop Assessment: no apparent nausea or vomiting Anesthetic complications: no    Last Vitals:  Vitals:   03/11/18 0855 03/11/18 0922  BP:    Pulse: 89 89  Resp: 16 16  Temp: 37.4 C 37.5 C  SpO2: 100% 100%    Last Pain:  Vitals:   03/11/18 0922  TempSrc: Core  PainSc:                  Avrianna Smart

## 2018-03-11 NOTE — Anesthesia Procedure Notes (Signed)
Procedure Name: Intubation Date/Time: 03/10/2018 9:00 PM Performed by: Claudina Lick, CRNA Pre-anesthesia Checklist: Patient identified, Emergency Drugs available, Suction available, Patient being monitored and Timeout performed Patient Re-evaluated:Patient Re-evaluated prior to induction Oxygen Delivery Method: Circle system utilized Preoxygenation: Pre-oxygenation with 100% oxygen Induction Type: IV induction, Rapid sequence and Cricoid Pressure applied Laryngoscope Size: Miller and 2 Grade View: Grade I Tube type: Subglottic suction tube Tube size: 8.0 mm Number of attempts: 1 Airway Equipment and Method: Stylet Placement Confirmation: ETT inserted through vocal cords under direct vision,  positive ETCO2 and breath sounds checked- equal and bilateral Secured at: 23 cm Tube secured with: Tape Dental Injury: Teeth and Oropharynx as per pre-operative assessment

## 2018-03-11 NOTE — Op Note (Signed)
CARDIOTHORACIC SURGERY OPERATIVE NOTE  Date of Procedure:  03/11/2018  Preoperative Diagnosis: Acute Type A Aortic Dissection  Postoperative Diagnosis: Same   Procedure:    Repair of Acute Ascending Aortic Dissection  Right axillary artery cannulation  Straight graft replacement of ascending thoracic aorta  Resuspension of native aortic valve  Open hemi-arch distal anastomosis   Surgeon: Salvatore Decent. Cornelius Moras, MD  Assistant: Jari Favre, PA-C  Anesthesia: Dorris Singh, MD  Operative Findings:  Acute type A aortic dissection  Primary tear in proximal ascending aorta  Mild central aortic insufficiency  Normal left ventricular systolic function  No residual aortic insufficiency after successful valve repair     BRIEF CLINICAL NOTE AND INDICATIONS FOR SURGERY  Patient is a 37 year old African-American male with history of hypertension who developed sudden onset of severe substernal chest pain radiating to the neck and left lower extremity pain this evening.  Pain quickly became extremely severe prompting call to EMS.  The patient was brought directly to the emergency room where he was notably severely hypertensive.  CT angiogram of the chest and abdomen reveals acute type a aortic dissection.  The patient has been seen in consultation and counseled at length regarding the indications, risks and potential benefits of surgery.  All questions have been answered, and the patient provides full informed consent for the operation as described.    DETAILS OF THE OPERATIVE PROCEDURE  Preparation:  The patient is brought to the operating room on the above mentioned date and central monitoring was established by the anesthesia team including placement of Swan-Ganz catheter and left radial arterial line. The patient is placed in the supine position on the operating table.  Intravenous antibiotics are administered. General endotracheal anesthesia is induced uneventfully. A Foley catheter  is placed.  Baseline transesophageal echocardiogram was performed.  Findings were notable for an obvious aortic dissection involving the proximal ascending thoracic aorta and extending throughout the aortic arch and the descending aorta.  The aortic valve was trileaflet with normal leaflet mobility.  There was mild to moderate central aortic insufficiency.  There was no pericardial effusion.  There was normal LV systolic function with moderate LV hypertrophy.  The patient's chest, abdomen, both groins, and both lower extremities are prepared and draped in a sterile manner. A time out procedure is performed.   Surgical Approach:  A median sternotomy incision was performed and the pericardium is opened. The ascending aorta is acutely dissected in appearance.  There is no blood nor fluid in the pericardial sac.   Extracorporeal Cardiopulmonary Bypass and Myocardial Protection:  A small incision is made in the right deltopectoral groove. The incision is completed through the subcutaneous tissues with electrocautery. The pectoralis major muscle fibers are split longitudinally. The deep pectoralis fashion is incised. The pectoralis minor muscle is retracted laterally. A self-retaining retractor is placed. Sharp dissection is utilized to identify the right axillary artery and dissected away from nearby structures. Care is taken to avoid manipulation of the brachial plexus. The right axillary artery is notably normal in appearance and without sign of dissection.  The patient is heparinized systemically. 2 small concentric pursestring sutures are placed on the anterior surface the right axillary artery. The right axillary artery is cannulated with the Seldinger technique and a flexible guidewire advanced into the descending aorta using TEE guidance. The right axillary artery is cannulated using an 18 French femoral arterial cannula. Care is taken to insert the catheter only 4 cm into the axillary artery to  make sure  that the distal tip of the catheter is in the right subclavian artery and not extending into the innominate artery.  The right atrium is cannulated using a standard 2-stage venous cannula. Cardiopulmonary bypass is begun. A retrograde cardioplegia cannula is placed through the right atrium into the chordae sinus. A left ventricular vent is placed through the right superior pulmonary vein.  The dissected ascending aorta is dissected away from the pulmonary artery. Dissection is continued to mobilize the innominate vein. The proximal innominate artery is dissected away from associated structures.  The patient is cooled to 28C systemic temperature.  The aortic cross clamp is applied and cardioplegia is delivered retrograde through the coronary sinus catheter using modified [place] cold blood cardioplegia (Kennestone blood cardioplegia protocol).   The initial cardioplegic arrest is rapid with early diastolic arrest.  Myocardial protection was felt to be excellent.   Repair of Acute Ascending Aortic Dissection:  The ascending aorta is transected just below the level of the aortic cross-clamp. The aorta is obviously dissected. Dissection is continued proximally and the entry tear is noted along the right lateral and posterior surface of the aorta close to the aortic root. The dissection does not extend down into the sinuses of Valsalva with exception of one short segment in the noncoronary sinus of Valsalva. The aortic valve is trileaflet. The dissected aorta is transected above the level of the origin of the left main and the right coronary arteries.  The aortic valve is resuspended using horizontal mattress pledgeted 4-0 Prolene sutures placed at the apex of each of the 3 commissures. The aorta is sized to accept a 26 mm vascular graft. The ascending aorta is replaced using a 26 mm Hemashield platinum woven double-velour vascular graft (ref # [id number], serial #[id number]).  The proximal  suture line is constructed using interrupted horizontal mattress 2-0 Ethibond pledgeted sutures. The suture line is reinforced using running 4-0 Prolene suture.  The patient is placed in Trendelenburg's position. High-dose etomidate and Solu-Medrol are administered. The patient's head is packed in ice.  Cardiopulmonary bypass is temporarily discontinued and the aortic cross-clamp removed and replaced across the innominate artery. With the pump off the aortic arch is carefully examined. There are no reentry tears in the arch. Low flow antegrade cerebral perfusion is commenced after less than 1 minute total circulatory arrest.  The distal portion of the ascending aorta is resected to the undersurface of the aortic arch. A separate portion of the 26 mm Hemashield graft is sewn in end-to-end fashion to the undersurface of the aortic arch using running 3-0 Prolene suture with Teflon felt strip to buttress the suture line. Several interrupted horizontal mattress pledgeted sutures are placed to buttress the suture line. The aortic graft is allowed to fill with blood and a cross clamp placed across the graft. Normal flow cardiopulmonary bypass is resumed after a total low flow antegrade cerebral perfusion time of 26 minutes. The distal suture line is inspected for hemostasis.  Rewarming is commenced.The proximal and distal segments of the aortic graft are trimmed and beveled to an appropriate length and sewn to each other using running 4-0 Prolene suture.  One final dose of warm retrograde "reanimation dose" cardioplegia is administered while all air is evacuated through the aortic root graft through a small hole made with thermal eye cautery.  The aortic cross clamp is removed after a total cross clamp time of 73 minutes.   Procedure Completion:  The entire aortic graft and all anastomoses are carefully inspected  for hemostasis.  Epicardial pacing wires are fixed to the right ventricular outflow tract and to the  right atrial appendage. The patient is rewarmed to 37C temperature. The aortic and left ventricular vents are removed.  The patient is weaned and disconnected from cardiopulmonary bypass.  The patient's rhythm at separation from bypass was sinus.  The patient was weaned from cardiopulmonary bypass without any inotropic support. Total cardiopulmonary bypass time for the operation was 143 minutes.  Followup transesophageal echocardiogram performed after separation from bypass revealed normal left ventricular function. The aortic graft was functioning normally. There was no aortic insufficiency.  The axillary artery cannula was removed uneventfully and the pursestring sutures were secured. There is a palpable pulse in the distal right axillary artery. Protamine was administered to reverse the anticoagulation. The venous cannula was removed.  The mediastinum and pleural space were inspected for hemostasis and irrigated with saline solution. The mediastinum and left pleural space were drained using 3 chest tubes placed through separate stab incisions inferiorly.  The soft tissues anterior to the aorta were reapproximated loosely. The sternum is closed with double strength sternal wire. The soft tissues anterior to the sternum were closed in multiple layers and the skin is closed with a running subcuticular skin closure.  The post-bypass portion of the operation was notable for stable rhythm and hemodynamics.  No blood products were administered during the operation.  After completion of the operation and removal of the drapes the patient's lower extremities were carefully inspected for vascular integrity. There were palpable pulses in the right lower leg. The left lower leg pulses were not palpable but there was a Doppler signal appreciated the posterior tibial and dorsalis pedis position. The left femoral pulse was weakly palpable. The left foot appeared adequately perfused.Intraoperative consultation with  Dr. Edilia Bo from vascular surgery was obtained and it was agreed by all that revascularization of the left lower leg was not indicated at that time.   Disposition:  There are no intraoperative complications.  The sponge and instrument counts were all correct but needle count was incorrect. A chest x-ray was obtained in the operating room to confirm absence of any retained needle.  The patient tolerated the procedure well and is transported to the surgical intensive care in stable condition.     Salvatore Decent. Cornelius Moras MD 03/11/2018 2:02 AM

## 2018-03-12 ENCOUNTER — Inpatient Hospital Stay (HOSPITAL_COMMUNITY): Payer: Self-pay

## 2018-03-12 LAB — BASIC METABOLIC PANEL
ANION GAP: 5 (ref 5–15)
BUN: 21 mg/dL — ABNORMAL HIGH (ref 6–20)
CO2: 24 mmol/L (ref 22–32)
Calcium: 7.5 mg/dL — ABNORMAL LOW (ref 8.9–10.3)
Chloride: 107 mmol/L (ref 98–111)
Creatinine, Ser: 1.63 mg/dL — ABNORMAL HIGH (ref 0.61–1.24)
GFR, EST NON AFRICAN AMERICAN: 52 mL/min — AB (ref >60)
GLUCOSE: 144 mg/dL — AB (ref 70–99)
Potassium: 4.3 mmol/L (ref 3.5–5.1)
SODIUM: 136 mmol/L (ref 135–145)

## 2018-03-12 LAB — PREPARE FRESH FROZEN PLASMA
Unit division: 0
Unit division: 0

## 2018-03-12 LAB — CBC
HCT: 28.7 % — ABNORMAL LOW (ref 39.0–52.0)
HCT: 26.9 % — ABNORMAL LOW (ref 39.0–52.0)
HEMOGLOBIN: 8.9 g/dL — AB (ref 13.0–17.0)
Hemoglobin: 9.3 g/dL — ABNORMAL LOW (ref 13.0–17.0)
MCH: 29.5 pg (ref 26.0–34.0)
MCH: 29.9 pg (ref 26.0–34.0)
MCHC: 32.4 g/dL (ref 30.0–36.0)
MCHC: 33.1 g/dL (ref 30.0–36.0)
MCV: 91.1 fL (ref 80.0–100.0)
MCV: 90.3 fL (ref 80.0–100.0)
NRBC: 0 % (ref 0.0–0.2)
Platelets: 195 10*3/uL (ref 150–400)
Platelets: 174 10*3/uL (ref 150–400)
RBC: 3.15 MIL/uL — AB (ref 4.22–5.81)
RBC: 2.98 MIL/uL — AB (ref 4.22–5.81)
RDW: 14.8 % (ref 11.5–15.5)
RDW: 14.6 % (ref 11.5–15.5)
WBC: 13.9 10*3/uL — AB (ref 4.0–10.5)
WBC: 13.3 10*3/uL — ABNORMAL HIGH (ref 4.0–10.5)
nRBC: 0 % (ref 0.0–0.2)

## 2018-03-12 LAB — GLUCOSE, CAPILLARY
GLUCOSE-CAPILLARY: 126 mg/dL — AB (ref 70–99)
Glucose-Capillary: 135 mg/dL — ABNORMAL HIGH (ref 70–99)
Glucose-Capillary: 145 mg/dL — ABNORMAL HIGH (ref 70–99)
Glucose-Capillary: 127 mg/dL — ABNORMAL HIGH (ref 70–99)
Glucose-Capillary: 131 mg/dL — ABNORMAL HIGH (ref 70–99)
Glucose-Capillary: 117 mg/dL — ABNORMAL HIGH (ref 70–99)

## 2018-03-12 LAB — PREPARE CRYOPRECIPITATE: Unit division: 0

## 2018-03-12 LAB — BPAM FFP
BLOOD PRODUCT EXPIRATION DATE: 201910212359
BLOOD PRODUCT EXPIRATION DATE: 201910212359
Blood Product Expiration Date: 201910212359
Blood Product Expiration Date: 201910212359
ISSUE DATE / TIME: 201910160341
ISSUE DATE / TIME: 201910160341
ISSUE DATE / TIME: 201910160732
ISSUE DATE / TIME: 201910160732
UNIT TYPE AND RH: 7300
Unit Type and Rh: 7300
Unit Type and Rh: 7300
Unit Type and Rh: 7300

## 2018-03-12 LAB — PREPARE PLATELET PHERESIS
UNIT DIVISION: 0
Unit division: 0

## 2018-03-12 LAB — MAGNESIUM: MAGNESIUM: 2.6 mg/dL — AB (ref 1.7–2.4)

## 2018-03-12 LAB — BPAM CRYOPRECIPITATE
BLOOD PRODUCT EXPIRATION DATE: 201910160854
ISSUE DATE / TIME: 201910160322
Unit Type and Rh: 6200

## 2018-03-12 LAB — BPAM PLATELET PHERESIS
BLOOD PRODUCT EXPIRATION DATE: 201910182359
BLOOD PRODUCT EXPIRATION DATE: 201910182359
ISSUE DATE / TIME: 201910160323
ISSUE DATE / TIME: 201910160732
UNIT TYPE AND RH: 7300
Unit Type and Rh: 7300

## 2018-03-12 LAB — CREATININE, SERUM
CREATININE: 1.5 mg/dL — AB (ref 0.61–1.24)
GFR, EST NON AFRICAN AMERICAN: 57 mL/min — AB (ref >60)

## 2018-03-12 MED ORDER — PNEUMOCOCCAL VAC POLYVALENT 25 MCG/0.5ML IJ INJ: 0.5000 mL | INJECTION | INTRAMUSCULAR | Status: DC

## 2018-03-12 MED ORDER — LEVALBUTEROL HCL 0.63 MG/3ML IN NEBU: 0.6300 mg | INHALATION_SOLUTION | Freq: Four times a day (QID) | RESPIRATORY_TRACT | Status: DC | PRN

## 2018-03-12 MED ORDER — LEVALBUTEROL HCL 0.63 MG/3ML IN NEBU: 0.6300 mg | INHALATION_SOLUTION | Freq: Three times a day (TID) | RESPIRATORY_TRACT | Status: DC

## 2018-03-12 MED ORDER — MOVING RIGHT ALONG BOOK
Freq: Once | Status: AC
  Administered 2018-03-12: 08:00:00
  Filled 2018-03-12: qty 1

## 2018-03-12 MED ORDER — INFLUENZA VAC SPLIT QUAD 0.5 ML IM SUSY: 0.5000 mL | PREFILLED_SYRINGE | INTRAMUSCULAR | Status: DC

## 2018-03-12 MED FILL — Electrolyte-R (PH 7.4) Solution: INTRAVENOUS | Qty: 3000 | Status: AC

## 2018-03-12 MED FILL — Mannitol IV Soln 20%: INTRAVENOUS | Qty: 500 | Status: AC

## 2018-03-12 MED FILL — Magnesium Sulfate Inj 50%: INTRAMUSCULAR | Qty: 10 | Status: AC

## 2018-03-12 MED FILL — Sodium Bicarbonate IV Soln 8.4%: INTRAVENOUS | Qty: 100 | Status: AC

## 2018-03-12 MED FILL — Lidocaine HCl(Cardiac) IV PF Soln Pref Syr 100 MG/5ML (2%): INTRAVENOUS | Qty: 25 | Status: AC

## 2018-03-12 MED FILL — Sodium Chloride IV Soln 0.9%: INTRAVENOUS | Qty: 2000 | Status: AC

## 2018-03-12 MED FILL — Heparin Sodium (Porcine) Inj 1000 Unit/ML: INTRAMUSCULAR | Qty: 30 | Status: AC

## 2018-03-12 MED FILL — Potassium Chloride Inj 2 mEq/ML: INTRAVENOUS | Qty: 40 | Status: AC

## 2018-03-12 NOTE — Progress Notes (Signed)
   VASCULAR SURGERY ASSESSMENT & PLAN:   2 Days Post-Op s/p: Repair of ascending aortic dissection.  He now has a palpable pulse in his left foot.  Vascular surgery will be available as needed.  SUBJECTIVE:   Doing well this morning up in a chair extubated.  No specific complaints.  PHYSICAL EXAM:   Vitals:   03/12/18 0400 03/12/18 0500 03/12/18 0600 03/12/18 0700  BP: 122/78 128/76    Pulse: 96 95 (!) 106 96  Resp: 17 20 (!) 35 (!) 34  Temp: 98.7 F (37.1 C)     TempSrc: Oral     SpO2: 95% 94% 94% 92%  Weight:   111.6 kg   Height:       Palpable dorsalis pedis pulses bilaterally.  LABS:   Lab Results  Component Value Date   WBC 13.9 (H) 03/12/2018   HGB 9.3 (L) 03/12/2018   HCT 28.7 (L) 03/12/2018   MCV 91.1 03/12/2018   PLT 195 03/12/2018   Lab Results  Component Value Date   CREATININE 1.63 (H) 03/12/2018   Lab Results  Component Value Date   INR 1.41 03/11/2018   CBG (last 3)  Recent Labs    03/11/18 2025 03/11/18 2339 03/12/18 0422  GLUCAP 130* 122* 135*    PROBLEM LIST:    Principal Problem:   S/P repair ascending aortic dissection Active Problems:   Essential hypertension   Acute thoracic aortic dissection (HCC)   Uncontrolled hypertension   CURRENT MEDS:   . acetaminophen  1,000 mg Oral Q6H   Or  . acetaminophen (TYLENOL) oral liquid 160 mg/5 mL  1,000 mg Per Tube Q6H  . aspirin EC  325 mg Oral Daily   Or  . aspirin  324 mg Per Tube Daily  . bisacodyl  10 mg Oral Daily   Or  . bisacodyl  10 mg Rectal Daily  . Chlorhexidine Gluconate Cloth  6 each Topical Daily  . docusate sodium  200 mg Oral Daily  . insulin aspart  0-24 Units Subcutaneous Q4H  . metoprolol tartrate  12.5 mg Oral BID   Or  . metoprolol tartrate  12.5 mg Per Tube BID  . [START ON 03/13/2018] pantoprazole  40 mg Oral Daily  . sodium chloride flush  10-40 mL Intracatheter Q12H  . sodium chloride flush  3 mL Intravenous Q12H    Waverly Ferrari Beeper:  [phone] Office: [phone] 03/12/2018

## 2018-03-12 NOTE — Progress Notes (Signed)
      [address]       [place] [address]             [phone]      POD # 2 repair of aortic dissection  Visiting with family  BP (!) 126/58   Pulse 93   Temp 98.2 F (36.8 C) (Oral)   Resp (!) 31   Ht 6\' 1"  (1.854 m)   Wt 111.6 kg   SpO2 90%   BMI 32.46 kg/m   Intake/Output Summary (Last 24 hours) at 03/12/2018 2111 Last data filed at 03/12/2018 2100 Gross per 24 hour  Intake 424.96 ml  Output 1030 ml  Net -605.04 ml   Hct= 27, creatinine down to 1.5  Viviann Spare C. Dorris Fetch, MD [facility] [phone]

## 2018-03-12 NOTE — Progress Notes (Signed)
[facility] DAILY ICU PROGRESS NOTE                   [address]            [place] [address]          [phone]   2 Days Post-Op Procedure(s) (LRB): REPAIR OF ASCENDING AORTIC DISECTION USING STRAIGHT HEMASHIELD PLATINUM VASCULAR GRAFT; OPEN HEMIARCH DISTAL ANASTOMOSIS (N/A) RESUSPENSION OF THE NATIVE AORTIC VALVE (N/A)  Total Length of Stay:  LOS: 2 days   Subjective:  Patient complains of pain.   He is coughing some sputum up.  Denies N/V.  He states he has not yet passed gas.  Objective: Vital signs in last 24 hours: Temp:  [98.7 F (37.1 C)-100.9 F (38.3 C)] 98.7 F (37.1 C) (10/17 0400) Pulse Rate:  [88-109] 96 (10/17 0700) Cardiac Rhythm: Normal sinus rhythm (10/17 0400) Resp:  [0-46] 34 (10/17 0700) BP: (89-141)/(58-95) 128/76 (10/17 0500) SpO2:  [89 %-100 %] 92 % (10/17 0700) Arterial Line BP: (81-143)/(52-92) 137/62 (10/17 0700) FiO2 (%):  [40 %-50 %] 40 % (10/16 1200) Weight:  [111.6 kg] 111.6 kg (10/17 0600)  Filed Weights   03/10/18 1850 03/11/18 0500 03/12/18 0600  Weight: 101.6 kg 112.7 kg 111.6 kg    Weight change: 9.994 kg   Hemodynamic parameters for last 24 hours: PAP: (24-81)/(7-54) 44/27 CO:  [4.1 L/min-5.5 L/min] 5.5 L/min CI:  [1.8 L/min/m2-2.4 L/min/m2] 2.4 L/min/m2  Intake/Output from previous day: 10/16 0701 - 10/17 0700 In: 3631.3 [P.O.:220; I.V.:2011; Blood:952.8; IV Piggyback:447.4] Out: 2070 [Urine:860; Emesis/NG output:200; Chest Tube:1010]  Current Meds: Scheduled Meds: . acetaminophen  1,000 mg Oral Q6H  . aspirin EC  325 mg Oral Daily  . bisacodyl  10 mg Oral Daily   Or  . bisacodyl  10 mg Rectal Daily  . Chlorhexidine Gluconate Cloth  6 each Topical Daily  . docusate sodium  200 mg Oral Daily  . insulin aspart  0-24 Units Subcutaneous Q4H  . metoprolol tartrate  12.5 mg Oral BID  . moving right along book   Does not apply Once  . [START ON 03/13/2018] pantoprazole  40 mg Oral Daily  . sodium  chloride flush  10-40 mL Intracatheter Q12H  . sodium chloride flush  3 mL Intravenous Q12H   Continuous Infusions: . albumin human Stopped (03/11/18 0700)  . cefUROXime (ZINACEF)  IV Stopped (03/11/18 2155)  . lactated ringers Stopped (03/11/18 0749)  . lactated ringers Stopped (03/11/18 2337)  . phenylephrine (NEO-SYNEPHRINE) Adult infusion 20 mcg/min (03/12/18 0700)   PRN Meds:.albumin human, labetalol, metoprolol tartrate, morphine injection, ondansetron (ZOFRAN) IV, oxyCODONE, sodium chloride flush, sodium chloride flush, traMADol  General appearance: alert, cooperative and no distress Heart: regular rate and rhythm Lungs: clear to auscultation bilaterally Abdomen: soft, non tender, little to no BS Extremities: edema trace Wound: clean and dry, aquacel in place on sternum  Lab Results: CBC: Recent Labs    03/11/18 1644 03/11/18 1653 03/12/18 0423  WBC 13.5*  --  13.9*  HGB 9.6* 9.2* 9.3*  HCT 29.4* 27.0* 28.7*  PLT 200  --  195   BMET:  Recent Labs    03/11/18 0551  03/11/18 1653 03/12/18 0423  NA 138   < > 140 136  K 5.9*   < > 4.2 4.3  CL 107   < > 106 107  CO2 24  --   --  24  GLUCOSE 128*   < > 115* 144*  BUN 12   < > 17 21*  CREATININE 1.20   < > 1.40* 1.63*  CALCIUM 7.2*  --   --  7.5*   < > = values in this interval not displayed.    CMET: Lab Results  Component Value Date   WBC 13.9 (H) 03/12/2018   HGB 9.3 (L) 03/12/2018   HCT 28.7 (L) 03/12/2018   PLT 195 03/12/2018   GLUCOSE 144 (H) 03/12/2018   ALT 40 03/10/2018   AST 36 03/10/2018   NA 136 03/12/2018   K 4.3 03/12/2018   CL 107 03/12/2018   CREATININE 1.63 (H) 03/12/2018   BUN 21 (H) 03/12/2018   CO2 24 03/12/2018   INR 1.41 03/11/2018      PT/INR:  Recent Labs    03/11/18 0551  LABPROT 17.1*  INR 1.41   Radiology: No results found.   Assessment/Plan: S/P Procedure(s) (LRB): REPAIR OF ASCENDING AORTIC DISECTION USING STRAIGHT HEMASHIELD PLATINUM VASCULAR GRAFT;  OPEN HEMIARCH DISTAL ANASTOMOSIS (N/A) RESUSPENSION OF THE NATIVE AORTIC VALVE (N/A)  1. CV- NSR, remains on Neo, weaning as tolerated, on low dose BB 2. Pulm- wean oxygen as tolerated, atelectasis on CXR, leave chest tubes in place today, will add xopenex 3. Renal-.cho creatinine elevated at 1.64, likely due to decreased perfusion with aortic dissection, avoid nephrotoxic agents, will likely require diuretics once BP improves 4. Expected post operative blood loss anemia, mild Hgb at 9.3 5. CBGs okay 6. Dispo- patient stable, maintaining NSR, wean Neo as tolerated, watch creatinine, leave chest tubes in place today,will add Xopenex for atelectasis, secretions, continue current care     Derrick Clayton 03/12/2018 7:52 AM

## 2018-03-12 NOTE — Discharge Summary (Addendum)
Physician Discharge Summary  Patient ID: Derrick Clayton MRN: [id number] DOB/AGE: [birth year] 37 y.o.  Admit date: 03/10/2018 Discharge date: 03/18/2018  Admission Diagnoses:  Patient Active Problem List   Diagnosis Date Noted  . Acute thoracic aortic dissection (HCC) 03/10/2018  . Uncontrolled hypertension 03/10/2018  . TOBACCO DEPENDENCE 07/24/2006  . Essential hypertension 07/24/2006   Discharge Diagnoses:   Patient Active Problem List   Diagnosis Date Noted  . S/P repair ascending aortic dissection 03/11/2018  . Acute thoracic aortic dissection (HCC) 03/10/2018  . Uncontrolled hypertension 03/10/2018  . TOBACCO DEPENDENCE 07/24/2006  . Essential hypertension 07/24/2006   Discharged Condition: good  History of Present Illness:  Mr. [name] is a 37 yo AA male with known history of HTN.  He developed sudden onset severe substernal chest pain with radiation in to the neck and left lower extremity.  The pain became severe and prompting the patient to call EMS.  Upon arrival to the ED the patient was found to be hypertensive.  CTA of the chest, abdomen, and pelvis which showed an acute Type A Aortic Dissection.  Cardiothoracic surgery consultation was requested for emergent surgical intervention.  Hospital Course:   The patient was evaluated by Dr. Cornelius Moras, at which time he stated he has been taking high blood pressure medications for years.  However, he recently ran out of his medications.  He has not been evaluated by a physician in years.  He re-iterated the pain is extremely severe with radiation to the neck.  He also noted severe pain in his left lower leg.  He also has associated shortness of breath.  It was felt emergent surgical intervention would be indicated.  The risks and benefits of the procedure were explained to the patient and he was agreeable to proceed.    He was taken directly to the operating room and underwent Repair of Acute Ascending Aortic Dissection.  He tolerated  the procedure without difficulty and was taken to the SICU in critical but stable condition.  During his stay in the SICU he was coagulopathic and thrombocytopenic.  He was treated with Factor VII which improved the patient's coagulopathy.  He was weaned and extubated on POD #1.  He is maintaining NSR and was weaned off Neo-synephrine drip as tolerated.  His chest tube output decreased and his chest tubes were removed on 03/13/2018.  He has a mild elevation in creatinine which was related to decreased perfusion due to his aortic dissection.  He was ambulating with minimal difficulty and was felt stable for transfer to the telemetry unit on 03/13/2018.  He continued to make progress.  He remains in NSR.  His pacing wires were removed without difficulty.  The patient progressed slowly post operatively.  His lopressor was titrated to 50 mg BID>  His creatinine returned to baseline with level at 1.07.  He was aggressively diuresed with lasix with good urinary output.  He has required a lot of encouragement to ambulate, which he can do with minimal difficulty.  Repeat CTA of the chest/abd/pelvis was obtained and showed stable repair of the ascending aorta.  There is stable appearance of descending dissection with improved perfusion to this left extremity and full perfusion to the kidneys.  He developed Atrial Fibrillation and was treated with IV Amiodarone with successful conversion to NSR.  His Lopressor was titrated for additional heart rate control.  He was transitioned to oral regimen of Amiodarone.  He remained volume overloaded and required additional diuresis with IV Lasix.  His  incisions are healing without evidence of infection.  He is tolerating a heart healthy diet.  He is medically stable for discharge home today.  Consults: vascular surgery  Significant Diagnostic Studies: radiology:   CT scan:  Type A aortic dissection extending from the near the aortic valve through the ascending thoracic aorta,  aortic arch, descending thoracic aorta, and abdominal aorta through the aortic bifurcation.  Dissection extends into the bovine arch, up the brachiocephalic artery and into the RIGHT common carotid artery, which demonstrate only a tiny rim of enhancing native lumen with majority of vessel non-opacified question noncommunicating versus thrombosed.  Marked narrowing of the origins of the celiac and superior mesenteric arteries which arise at the intimal flap junction with the small anterior native lumen.  Patchiness of BILATERAL nephrograms consistent with a degree of hypoperfusion/vascular impairment in both kidneys, more diffusely on the RIGHT and focally at the upper pole of the LEFT kidney.  Dissection extends into the RIGHT common iliac artery but not beyond.  Dissection extends into the LEFT common iliac artery into its bifurcation and then into the LEFT internal iliac artery which shows only a thin rim of residual enhancement of the native lumen; no enhancement is seen within the remaining portion of the LEFT internal iliac lumen.  Post Operative CTA:  1. Normally patent aortic graft after replacement of the ascending thoracic aorta. 2. Residual dissection beginning at the level of the aortic arch and extending into the innominate artery. Dissection extends through the descending thoracic aorta, abdominal aorta and just into bilateral proximal common iliac arteries. 3. Dissection likely extends just into the proximal aspect of the left renal artery without evidence of obvious left renal ischemia. 4. Resolved ischemia of the left lower extremity with now widely patent external iliac artery compared to the prior study. 5. Small bilateral pleural effusions and probable component of pulmonary edema with ground-glass airspace opacity present in both lungs, predominantly in the upper lung zones bilaterally.  Treatments: surgery:    Repair of Acute Ascending Aortic  Dissection             Right axillary artery cannulation             Straight graft replacement of ascending thoracic aorta             Resuspension of native aortic valve             Open hemi-arch distal anastomosis  Discharge Exam: Blood pressure 114/69, pulse 76, temperature 98.1 F (36.7 C), temperature source Oral, resp. rate 18, height 6\' 1"  (1.854 m), weight 108.5 kg, SpO2 94 %.  General appearance: alert, cooperative and no distress Heart: regular rate and rhythm Lungs: diminished breath sounds bibasilar Abdomen: soft, non-tender; bowel sounds normal; no masses,  no organomegaly Extremities: edema trace Wound: clean and dry  Disposition: Home  Discharge Medications:  The patient has been discharged on:   1.Beta Blocker:  Yes [ x  ]                              No   [   ]                              If No, reason:  2.Ace Inhibitor/ARB: Yes [   ]  No  [  x  ]                                     If No, reason:  3.Statin:   Yes [   ]                  No  [ x  ]                  If No, reason: no CAD  4.Marlowe KaysValentino Hue  [ X  ]                  No   [   ]                  If No, reason:      Allergies as of 03/18/2018   No Known Allergies     Medication List    STOP taking these medications   acyclovir 800 MG tablet Commonly known as:  ZOVIRAX   amLODipine 5 MG tablet Commonly known as:  NORVASC   lisinopril 10 MG tablet Commonly known as:  PRINIVIL,ZESTRIL   methocarbamol 500 MG tablet Commonly known as:  ROBAXIN     TAKE these medications   acetaminophen 500 MG tablet Commonly known as:  TYLENOL Take 500 mg by mouth every 6 (six) hours as needed (pain).   amiodarone 200 MG tablet Commonly known as:  PACERONE Take 2 tablets (400 mg total) by mouth 2 (two) times daily after a meal. X 5 days, then decrease to 200 mg BID x 5 days, then decrease to 200 mg daily   aspirin 325 MG EC tablet Take 1 tablet (325 mg  total) by mouth daily.   furosemide 40 MG tablet Commonly known as:  LASIX Take 1 tablet (40 mg total) by mouth daily. Start taking on:  03/19/2018   metoprolol tartrate 100 MG tablet Commonly known as:  LOPRESSOR Take 1 tablet (100 mg total) by mouth 2 (two) times daily.   Oxycodone HCl 10 MG Tabs Take 1 tablet (10 mg total) by mouth every 4 (four) hours as needed for severe pain.   potassium chloride SA 20 MEQ tablet Commonly known as:  K-DUR,KLOR-CON Take 1 tablet (20 mEq total) by mouth daily.      Follow-up Information    Purcell Nails, MD Follow up on 04/13/2018.   Specialty:  Cardiothoracic Surgery Why:  Appointment is at 2:30, please get CXR at 2:00 at [facility] located on first floor of our office building Contact information: [address] [place] [address] [phone]        Jodelle Red, MD Follow up on 03/30/2018.   Specialty:  Cardiology Why:  Please arrive 15 minutes early for your 9:40am post-hospital cardiology follow-up appointment Contact information: [address] [place] [address] [place] [address] [phone]        [facility]. Go on 03/23/2018.   Specialty:  Internal Medicine Why:  at 1pm for hospital followup appointment Contact information: [address] [place] [place] [address] [phone]       [facility] Follow up.   Why:  for your prescriptions needs. Medications $4-$10.  Contact information: [address] [place] [address] [phone]          Signed: Denny Peon  Barrett 03/18/2018, 3:59 PM

## 2018-03-12 NOTE — Discharge Instructions (Signed)

## 2018-03-13 LAB — CBC
HCT: 27.8 % — ABNORMAL LOW (ref 39.0–52.0)
HEMOGLOBIN: 8.9 g/dL — AB (ref 13.0–17.0)
MCH: 29.4 pg (ref 26.0–34.0)
MCHC: 32 g/dL (ref 30.0–36.0)
MCV: 91.7 fL (ref 80.0–100.0)
Platelets: 176 10*3/uL (ref 150–400)
RBC: 3.03 MIL/uL — ABNORMAL LOW (ref 4.22–5.81)
RDW: 14.4 % (ref 11.5–15.5)
WBC: 13.8 10*3/uL — AB (ref 4.0–10.5)
nRBC: 0 % (ref 0.0–0.2)

## 2018-03-13 LAB — BASIC METABOLIC PANEL
Anion gap: 10 (ref 5–15)
BUN: 24 mg/dL — ABNORMAL HIGH (ref 6–20)
CALCIUM: 8.6 mg/dL — AB (ref 8.9–10.3)
CHLORIDE: 102 mmol/L (ref 98–111)
CO2: 23 mmol/L (ref 22–32)
CREATININE: 1.4 mg/dL — AB (ref 0.61–1.24)
GFR calc non Af Amer: >60 mL/min (ref >60)
Glucose, Bld: 106 mg/dL — ABNORMAL HIGH (ref 70–99)
Potassium: 4.8 mmol/L (ref 3.5–5.1)
SODIUM: 135 mmol/L (ref 135–145)

## 2018-03-13 LAB — GLUCOSE, CAPILLARY
GLUCOSE-CAPILLARY: 108 mg/dL — AB (ref 70–99)
Glucose-Capillary: 109 mg/dL — ABNORMAL HIGH (ref 70–99)

## 2018-03-13 LAB — MRSA PCR SCREENING: MRSA BY PCR: NEGATIVE

## 2018-03-13 MED ORDER — FUROSEMIDE 10 MG/ML IJ SOLN
40.0000 mg | Freq: Two times a day (BID) | INTRAMUSCULAR | Status: DC
  Administered 2018-03-13 – 2018-03-14 (×4): 40 mg via INTRAVENOUS
  Filled 2018-03-13 (×4): qty 4

## 2018-03-13 MED ORDER — INFLUENZA VAC SPLIT QUAD 0.5 ML IM SUSY: 0.5000 mL | PREFILLED_SYRINGE | INTRAMUSCULAR | Status: DC | PRN

## 2018-03-13 MED ORDER — METOPROLOL TARTRATE 25 MG PO TABS
25.0000 mg | ORAL_TABLET | Freq: Two times a day (BID) | ORAL | Status: DC
  Administered 2018-03-13 (×2): 25 mg via ORAL
  Filled 2018-03-13 (×2): qty 1

## 2018-03-13 MED ORDER — FUROSEMIDE 10 MG/ML IJ SOLN: 40.0000 mg | Freq: Once | INTRAMUSCULAR | Status: DC

## 2018-03-13 MED ORDER — PNEUMOCOCCAL VAC POLYVALENT 25 MCG/0.5ML IJ INJ: 0.5000 mL | INJECTION | INTRAMUSCULAR | Status: DC | PRN

## 2018-03-13 NOTE — Care Management Note (Signed)
Case Management Note  Patient Details  Name: Derrick Clayton MRN: [id number] Date of Birth: 04-16-1981  Subjective/Objective:  37yo male presented with severe CP; PMH: HTN. Patient is s/p repair of Ascending Aortic Dissection.                Action/Plan: CM met with patient to discuss transitional needs. Patient lived at home with family and was independent with ADLs PTA. Patient confirmed not having a local PCP nor health insurance coverage, with agreeable to CM assisting. CM arranged a hospital f/u appointment at: [facility] on 03/23/18 @ 1pm; patient Rx can be filled utilizing [facility] or f/u at [facility] for Rx if patient discharges M-F for $4-$10 Rx which patient verbalized he's able to afford; if a weekend discharge, patient can be screened for [facility] letter once discharge medications are determined. Patient indicated his family could assist post transition and provide transportation home. CM will continue to follow.   Expected Discharge Date:                  Expected Discharge Plan:  Home/Self Care  In-House Referral:  NA  Discharge planning Services  CM Consult, [facility], Follow-up appt scheduled, Medication Assistance  Post Acute Care Choice:  NA Choice offered to:  NA  DME Arranged:  N/A DME Agency:  NA  HH Arranged:  NA HH Agency:  NA  Status of Service:  In process, will continue to follow  If discussed at Long Length of Stay Meetings, dates discussed:    Additional Comments:  Midge Minium RN, BSN, NCM-BC, ACM-RN [phone] 03/13/2018, 1:10 PM

## 2018-03-13 NOTE — Progress Notes (Addendum)
[facility]CTS DAILY ICU PROGRESS NOTE                   [address]            [place] [address]          [phone]   3 Days Post-Op Procedure(s) (LRB): REPAIR OF ASCENDING AORTIC DISECTION USING STRAIGHT HEMASHIELD PLATINUM VASCULAR GRAFT; OPEN HEMIARCH DISTAL ANASTOMOSIS (N/A) RESUSPENSION OF THE NATIVE AORTIC VALVE (N/A)  Total Length of Stay:  LOS: 3 days   Subjective:  No new complaints.  He remains tired, due to not sleeping well.  He ambulated yesterday without significant difficulty.    Objective: Vital signs in last 24 hours: Temp:  [98.2 F (36.8 C)-99.1 F (37.3 C)] 98.8 F (37.1 C) (10/18 0400) Pulse Rate:  [89-99] 90 (10/18 0649) Cardiac Rhythm: Normal sinus rhythm (10/18 0400) Resp:  [12-38] 18 (10/18 0649) BP: (112-146)/(49-86) 128/49 (10/18 0600) SpO2:  [82 %-98 %] 92 % (10/18 0649) Arterial Line BP: (120-131)/(63-64) 120/64 (10/17 0900) FiO2 (%):  [55 %-100 %] 55 % (10/18 0400)  Filed Weights   03/10/18 1850 03/11/18 0500 03/12/18 0600  Weight: 101.6 kg 112.7 kg 111.6 kg    Weight change:    Intake/Output from previous day: 10/17 0701 - 10/18 0700 In: 217.2 [I.V.:17.2; IV Piggyback:200] Out: 1140 [Urine:790; Chest Tube:350]  Current Meds: Scheduled Meds: . acetaminophen  1,000 mg Oral Q6H  . aspirin EC  325 mg Oral Daily  . bisacodyl  10 mg Oral Daily   Or  . bisacodyl  10 mg Rectal Daily  . Chlorhexidine Gluconate Cloth  6 each Topical Daily  . docusate sodium  200 mg Oral Daily  . Influenza vac split quadrivalent PF  0.5 mL Intramuscular Tomorrow-1000  . insulin aspart  0-24 Units Subcutaneous Q4H  . metoprolol tartrate  12.5 mg Oral BID  . pantoprazole  40 mg Oral Daily  . pneumococcal 23 valent vaccine  0.5 mL Intramuscular Tomorrow-1000  . sodium chloride flush  10-40 mL Intracatheter Q12H  . sodium chloride flush  3 mL Intravenous Q12H   Continuous Infusions: . lactated ringers Stopped (03/11/18 0749)  . lactated  ringers Stopped (03/11/18 2337)  . phenylephrine (NEO-SYNEPHRINE) Adult infusion Stopped (03/12/18 0811)   PRN Meds:.labetalol, levalbuterol, metoprolol tartrate, morphine injection, ondansetron (ZOFRAN) IV, oxyCODONE, sodium chloride flush, sodium chloride flush, traMADol  General appearance: alert, cooperative and no distress Heart: regular rate and rhythm Lungs: diminished breath sounds bibasilar Abdomen: soft, non-tender; bowel sounds normal; no masses,  no organomegaly Extremities: edema trace Wound: clean and dry  Lab Results: CBC: Recent Labs    03/12/18 1620 03/13/18 0301  WBC 13.3* 13.8*  HGB 8.9* 8.9*  HCT 26.9* 27.8*  PLT 174 176   BMET:  Recent Labs    03/12/18 0423 03/12/18 1620 03/13/18 0301  NA 136  --  135  K 4.3  --  4.8  CL 107  --  102  CO2 24  --  23  GLUCOSE 144*  --  106*  BUN 21*  --  24*  CREATININE 1.63* 1.50* 1.40*  CALCIUM 7.5*  --  8.6*    CMET: Lab Results  Component Value Date   WBC 13.8 (H) 03/13/2018   HGB 8.9 (L) 03/13/2018   HCT 27.8 (L) 03/13/2018   PLT 176 03/13/2018   GLUCOSE 106 (H) 03/13/2018   ALT 40 03/10/2018   AST 36 03/10/2018   NA 135 03/13/2018  K 4.8 03/13/2018   CL 102 03/13/2018   CREATININE 1.40 (H) 03/13/2018   BUN 24 (H) 03/13/2018   CO2 23 03/13/2018   INR 1.41 03/11/2018    PT/INR:  Recent Labs    03/11/18 0551  LABPROT 17.1*  INR 1.41   Radiology: No results found.   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 CV- NSR, off Neosynephrine- Hypertensive at times into the 140s- will continue Lopressor at 12.5 mg BID 2. Pulm- no acute issues, CT output >200 cc yesterday, will defer removal to Dr. Cornelius Moras, continue Xopenex nebs, patient states helped with coughing up sputum, wean oxygen as tolerated 3. Renal- creatinine  improving, down to 1.40, off Neo, may benefit from starting low dose Lasix today, K is WNL 4. Expected post operative blood loss anemia, stable at 8.9 5. CBGs- well controlled, patient is not a diabetic, will d/c SSIP 6. Dispo- patient stable, off Neo, BP in the 140s but okay with elevated creatinine, continue low dose lopressor, start IV lasix 40 mg today, stop CBGs, chest tube output >200 cc yesterday, will defer removal to Dr. Cornelius Moras, possibly transfer to stepdown soon     Lowella Dandy 03/13/2018 7:28 AM   I have seen and examined the patient and agree with the assessment and plan as outlined.  Mobilize.  D/C chest tubes.  Increase metoprolol 50 bid.  Try some lasix to stimulate diuresis.  Transfer step down.   Purcell Nails, MD 03/13/2018 7:55 AM

## 2018-03-13 NOTE — Progress Notes (Signed)
Patient refused to ambulate this morning. He promise to  ambulate after breakfast

## 2018-03-13 NOTE — Progress Notes (Signed)
CARDIAC REHAB PHASE I   PRE:  Rate/Rhythm: 89 SR  BP:  Sitting: 139/69      SaO2: 93 5L  MODE:  Ambulation: 200 ft   POST:  Rate/Rhythm: 89 SR irreg  BP:  Sitting: pt in BR    SaO2: 91 8L   Pt very hesitant to walk, c/o pain, weakness, and tiredness. Pt then agreeable, and ambulated 272ft in hallway assist of one with front wheel walker.  Pt took several standing rest breaks and appeared  Very SOB upon return to room. Pt coached through purse lipped breathing. Pt helped to BR. Bathroom call light within reach. Will continue to follow and encourage ambulation.  [id number] Reynold Bowen, RN BSN 03/13/2018 2:44 PM

## 2018-03-14 ENCOUNTER — Inpatient Hospital Stay (HOSPITAL_COMMUNITY): Payer: Self-pay

## 2018-03-14 LAB — BPAM RBC
BLOOD PRODUCT EXPIRATION DATE: 201911142359
BLOOD PRODUCT EXPIRATION DATE: 201911142359
BLOOD PRODUCT EXPIRATION DATE: 201911162359
Blood Product Expiration Date: 201911122359
Blood Product Expiration Date: 201911122359
Blood Product Expiration Date: 201911122359
Blood Product Expiration Date: 201911162359
Blood Product Expiration Date: 201911162359
Blood Product Expiration Date: 201911162359
Blood Product Expiration Date: 201911162359
Blood Product Expiration Date: 201911162359
Blood Product Expiration Date: 201911172359
ISSUE DATE / TIME: 201910152129
ISSUE DATE / TIME: 201910152129
ISSUE DATE / TIME: 201910160404
ISSUE DATE / TIME: 201910160404
ISSUE DATE / TIME: 201910160734
ISSUE DATE / TIME: 201910160920
ISSUE DATE / TIME: 201910161204
ISSUE DATE / TIME: 201910161253
UNIT TYPE AND RH: 5100
UNIT TYPE AND RH: 5100
UNIT TYPE AND RH: 7300
UNIT TYPE AND RH: 7300
UNIT TYPE AND RH: 7300
Unit Type and Rh: 5100
Unit Type and Rh: 5100
Unit Type and Rh: 7300
Unit Type and Rh: 7300
Unit Type and Rh: 7300
Unit Type and Rh: 7300
Unit Type and Rh: 7300

## 2018-03-14 LAB — TYPE AND SCREEN
ABO/RH(D): B POS
ANTIBODY SCREEN: NEGATIVE
UNIT DIVISION: 0
UNIT DIVISION: 0
UNIT DIVISION: 0
UNIT DIVISION: 0
UNIT DIVISION: 0
UNIT DIVISION: 0
Unit division: 0
Unit division: 0
Unit division: 0
Unit division: 0
Unit division: 0
Unit division: 0

## 2018-03-14 LAB — BASIC METABOLIC PANEL
ANION GAP: 10 (ref 5–15)
BUN: 29 mg/dL — ABNORMAL HIGH (ref 6–20)
CALCIUM: 8.5 mg/dL — AB (ref 8.9–10.3)
CO2: 25 mmol/L (ref 22–32)
CREATININE: 1.25 mg/dL — AB (ref 0.61–1.24)
Chloride: 100 mmol/L (ref 98–111)
GFR calc Af Amer: >60 mL/min (ref >60)
GFR calc non Af Amer: >60 mL/min (ref >60)
Glucose, Bld: 109 mg/dL — ABNORMAL HIGH (ref 70–99)
Potassium: 4.4 mmol/L (ref 3.5–5.1)
SODIUM: 135 mmol/L (ref 135–145)

## 2018-03-14 LAB — CBC
HCT: 25.7 % — ABNORMAL LOW (ref 39.0–52.0)
Hemoglobin: 8.3 g/dL — ABNORMAL LOW (ref 13.0–17.0)
MCH: 29 pg (ref 26.0–34.0)
MCHC: 32.3 g/dL (ref 30.0–36.0)
MCV: 89.9 fL (ref 80.0–100.0)
PLATELETS: 223 10*3/uL (ref 150–400)
RBC: 2.86 MIL/uL — ABNORMAL LOW (ref 4.22–5.81)
RDW: 13.6 % (ref 11.5–15.5)
WBC: 11 10*3/uL — AB (ref 4.0–10.5)
nRBC: 1.1 % — ABNORMAL HIGH (ref 0.0–0.2)

## 2018-03-14 MED ORDER — FE FUMARATE-B12-VIT C-FA-IFC PO CAPS
1.0000 | ORAL_CAPSULE | Freq: Three times a day (TID) | ORAL | Status: DC
  Administered 2018-03-14 – 2018-03-18 (×13): 1 via ORAL
  Filled 2018-03-14 (×13): qty 1

## 2018-03-14 MED ORDER — LEVALBUTEROL HCL 0.63 MG/3ML IN NEBU
0.6300 mg | INHALATION_SOLUTION | Freq: Four times a day (QID) | RESPIRATORY_TRACT | Status: DC
  Administered 2018-03-14 (×2): 0.63 mg via RESPIRATORY_TRACT
  Filled 2018-03-14 (×2): qty 3

## 2018-03-14 MED ORDER — METOPROLOL TARTRATE 50 MG PO TABS
50.0000 mg | ORAL_TABLET | Freq: Two times a day (BID) | ORAL | Status: DC
  Administered 2018-03-14 – 2018-03-17 (×7): 50 mg via ORAL
  Filled 2018-03-14 (×7): qty 1

## 2018-03-14 NOTE — Progress Notes (Signed)
O2 sats on 6 liters N.C. Running 85-89 % occ. Up to 92-94% Enc. I.S. Poor effort 500 on I.S. Spoke with resp. Will try High flow O2 .Started 8 liters sats 89-92% inc. to 10 liters high flow 98-100% .  After a while dec o2 to 8 liters high flow 92-95% No resp.. distress noted R.N. Aware.

## 2018-03-14 NOTE — Progress Notes (Signed)
CARDIAC REHAB PHASE I   PRE:  Rate/Rhythm: 95 SR  BP:  Sitting: 130/73      SaO2: 93 8L  MODE:  Ambulation: 160 ft 104 peak HR  POST:  Rate/Rhythm: 92 SR  BP:  Sitting: 116/75    SaO2: 93 10L   Pt ambulated 110ft in hallway assist of one with front wheel walker. Pt with very slow but steady gait. Pt took one standing rest break, when pt went to turn to go back to room, pt became dizzy. Pt sat down in chair and rested. After 15 min of rest pt still c/o "seeing stars". Pt assisted to wheelchair and wheeled back to his room. Pt helped to BR, bathroom call light within reach. Encouraged pt to walk again later today, and encouraged walks tomorrow. Will continue to follow.  [id number] Reynold Bowen, RN BSN 03/14/2018 9:46 AM

## 2018-03-14 NOTE — Progress Notes (Addendum)
[address]       [place] [address]             [phone]      4 Days Post-Op Procedure(s) (LRB): REPAIR OF ASCENDING AORTIC DISECTION USING STRAIGHT HEMASHIELD PLATINUM VASCULAR GRAFT; OPEN HEMIARCH DISTAL ANASTOMOSIS (N/A) RESUSPENSION OF THE NATIVE AORTIC VALVE (N/A)   Subjective:  Patient without specific complaints.  Respiratory issues overnight noted.  Spoke with nursing who states patient does not want to do anything, they had to fight with him to get him to walk and get into the chair.  No BM since 10/15   Objective: Vital signs in last 24 hours: Temp:  [98.1 F (36.7 C)-98.5 F (36.9 C)] 98.4 F (36.9 C) (10/19 0500) Pulse Rate:  [87-96] 93 (10/19 0500) Cardiac Rhythm: Normal sinus rhythm (10/18 1900) Resp:  [15-31] 31 (10/19 0500) BP: (106-139)/(69-77) 130/73 (10/19 0500) SpO2:  [89 %-97 %] 91 % (10/19 0500) Weight:  [109.9 kg] 109.9 kg (10/19 0447)  Intake/Output from previous day: 10/18 0701 - 10/19 0700 In: 1440 [P.O.:1440] Out: 1665 [Urine:1625; Chest Tube:40]  General appearance: alert, cooperative and no distress Heart: regular rate and rhythm Lungs: diminished breath sounds bilaterally Abdomen: soft, non-tender; bowel sounds normal; no masses,  no organomegaly Extremities: edema trace Wound: clean and dry  Lab Results: Recent Labs    03/13/18 0301 03/14/18 0422  WBC 13.8* 11.0*  HGB 8.9* 8.3*  HCT 27.8* 25.7*  PLT 176 223   BMET:  Recent Labs    03/13/18 0301 03/14/18 0422  NA 135 135  K 4.8 4.4  CL 102 100  CO2 23 25  GLUCOSE 106* 109*  BUN 24* 29*  CREATININE 1.40* 1.25*  CALCIUM 8.6* 8.5*    PT/INR: No results for input(s): LABPROT, INR in the last 72 hours. ABG    Component Value Date/Time   PHART 7.391 03/11/2018 1352   HCO3 21.9 03/11/2018 1352   TCO2 20 (L) 03/11/2018 1653   ACIDBASEDEF 2.0 03/11/2018 1352   O2SAT 96.0 03/11/2018 1352   CBG (last 3)  Recent Labs    03/12/18 2314  03/13/18 0318 03/13/18 0730  GLUCAP 117* 108* 109*    Assessment/Plan: S/P Procedure(s) (LRB): REPAIR OF ASCENDING AORTIC DISECTION USING STRAIGHT HEMASHIELD PLATINUM VASCULAR GRAFT; OPEN HEMIARCH DISTAL ANASTOMOSIS (N/A) RESUSPENSION OF THE NATIVE AORTIC VALVE (N/A)  1. CV- NSR, BP mostly controlled- Lopressor at 25 mg BID, will increase to 50 mg BID 2. Pulm-worsening atelectasis on CXR, poor use of IS, required high flow oxygen last night to maintain oxygen saturations, will add flutter valve, nebs were changed to prn, have returned them to scheduled, instructed patient on importance of good deep breaths and using IS hourly 3. Renal- creatinine continues to improved, some mild pulmonary edema on CXR, will repeat IV Lasix today, K is ok 4. Deconditioning- patient not willing to ambulate much, requiring a lot of encouragement, instructed patient on importance of ambulating 5. Dispo- patient with worsening respiratory status overnight, I think this is due to poor effort on patient's part, educated on importance of good use of IS, added flutter valve, xopenex nebs, will increase Lopressor to 50 mg BID, will order CTA CAP to assess dissection, continue current care   LOS: 4 days    Erin Barrett 03/14/2018   I have seen and examined the patient and agree with the assessment and plan as outlined.  Needs lots of encouragement but overall doing quite  well.  Purcell Nails, MD 03/14/2018 10:50 AM

## 2018-03-15 LAB — BASIC METABOLIC PANEL
Anion gap: 8 (ref 5–15)
BUN: 23 mg/dL — AB (ref 6–20)
CHLORIDE: 100 mmol/L (ref 98–111)
CO2: 28 mmol/L (ref 22–32)
CREATININE: 1.07 mg/dL (ref 0.61–1.24)
Calcium: 8.6 mg/dL — ABNORMAL LOW (ref 8.9–10.3)
GFR calc Af Amer: >60 mL/min (ref >60)
GFR calc non Af Amer: >60 mL/min (ref >60)
Glucose, Bld: 116 mg/dL — ABNORMAL HIGH (ref 70–99)
POTASSIUM: 3.8 mmol/L (ref 3.5–5.1)
Sodium: 136 mmol/L (ref 135–145)

## 2018-03-15 MED ORDER — FUROSEMIDE 40 MG PO TABS
40.0000 mg | ORAL_TABLET | Freq: Every day | ORAL | Status: DC
  Administered 2018-03-15 – 2018-03-16 (×2): 40 mg via ORAL
  Filled 2018-03-15 (×2): qty 1

## 2018-03-15 MED ORDER — ZOLPIDEM TARTRATE 5 MG PO TABS
5.0000 mg | ORAL_TABLET | Freq: Every evening | ORAL | Status: DC | PRN
  Administered 2018-03-15 – 2018-03-17 (×3): 5 mg via ORAL
  Filled 2018-03-15 (×3): qty 1

## 2018-03-15 MED ORDER — POTASSIUM CHLORIDE CRYS ER 20 MEQ PO TBCR
20.0000 meq | EXTENDED_RELEASE_TABLET | Freq: Every day | ORAL | Status: DC
  Administered 2018-03-15 – 2018-03-18 (×4): 20 meq via ORAL
  Filled 2018-03-15 (×4): qty 1

## 2018-03-15 MED ORDER — LEVALBUTEROL HCL 0.63 MG/3ML IN NEBU
0.6300 mg | INHALATION_SOLUTION | Freq: Three times a day (TID) | RESPIRATORY_TRACT | Status: DC
  Administered 2018-03-15 (×3): 0.63 mg via RESPIRATORY_TRACT
  Filled 2018-03-15 (×3): qty 3

## 2018-03-15 NOTE — Progress Notes (Addendum)
      [address]       [place] [address]             [phone]      5 Days Post-Op Procedure(s) (LRB): REPAIR OF ASCENDING AORTIC DISECTION USING STRAIGHT HEMASHIELD PLATINUM VASCULAR GRAFT; OPEN HEMIARCH DISTAL ANASTOMOSIS (N/A) RESUSPENSION OF THE NATIVE AORTIC VALVE (N/A)   Subjective:  Patient complains of difficulty sleeping.  He requesting a sleep aide for this evening.  + ambulation with walker  Objective: Vital signs in last 24 hours: Temp:  [97.9 F (36.6 C)-98.9 F (37.2 C)] 98.9 F (37.2 C) (10/20 0307) Pulse Rate:  [86-93] 86 (10/20 0307) Cardiac Rhythm: Normal sinus rhythm (10/20 0300) Resp:  [15-30] 23 (10/20 0307) BP: (119-132)/(71-78) 130/71 (10/20 0307) SpO2:  [94 %-99 %] 95 % (10/20 0307)  Intake/Output from previous day: 10/19 0701 - 10/20 0700 In: -  Out: 1750 [Urine:1750]  General appearance: alert, cooperative and no distress Heart: regular rate and rhythm Lungs: clear to auscultation bilaterally Abdomen: soft, non-tender; bowel sounds normal; no masses,  no organomegaly Extremities: edema trace Wound: clean and dry  Lab Results: Recent Labs    03/13/18 0301 03/14/18 0422  WBC 13.8* 11.0*  HGB 8.9* 8.3*  HCT 27.8* 25.7*  PLT 176 223   BMET:  Recent Labs    03/14/18 0422 03/15/18 0438  NA 135 136  K 4.4 3.8  CL 100 100  CO2 25 28  GLUCOSE 109* 116*  BUN 29* 23*  CREATININE 1.25* 1.07  CALCIUM 8.5* 8.6*    PT/INR: No results for input(s): LABPROT, INR in the last 72 hours. ABG    Component Value Date/Time   PHART 7.391 03/11/2018 1352   HCO3 21.9 03/11/2018 1352   TCO2 20 (L) 03/11/2018 1653   ACIDBASEDEF 2.0 03/11/2018 1352   O2SAT 96.0 03/11/2018 1352   CBG (last 3)  Recent Labs    03/12/18 2314 03/13/18 0318 03/13/18 0730  GLUCAP 117* 108* 109*    Assessment/Plan: S/P Procedure(s) (LRB): REPAIR OF ASCENDING AORTIC DISECTION USING STRAIGHT HEMASHIELD PLATINUM VASCULAR GRAFT; OPEN  HEMIARCH DISTAL ANASTOMOSIS (N/A) RESUSPENSION OF THE NATIVE AORTIC VALVE (N/A)  1. CV- NSR, Bp controlled- continue Lopressor at 50 mg daily 2 Pulm- wean oxygen as tolerated, using flutter valve, and encouraged continued use of IS, will get repeat CXR in AM 3. Renal- creatinine is WNL at 1.05, weight is trending down, will transition to oral Lasix, potassium 4. Deconditioning- continue to ambulate TID, continues to require a lot of encouragement 5. Dispo- patient stable, continue diuretics, aggressive pulmonary toilet, will repeat CXR in AM, creatinine is WNL, will need CTA C/A/P prior to discharge, continue current care  LOS: 5 days    Erin Barrett 03/15/2018   I have seen and examined the patient and agree with the assessment and plan as outlined.  Purcell Nails, MD 03/15/2018 6:29 PM

## 2018-03-15 NOTE — Progress Notes (Signed)
Pt had trouble getting comfortable and sleeping. Holding standing weight until pt wakes per pt request. Pt would also like something to help him sleep. Will pass on to day shift RN.

## 2018-03-16 ENCOUNTER — Other Ambulatory Visit: Payer: Self-pay

## 2018-03-16 ENCOUNTER — Inpatient Hospital Stay (HOSPITAL_COMMUNITY): Payer: Self-pay

## 2018-03-16 ENCOUNTER — Encounter (HOSPITAL_COMMUNITY): Payer: Self-pay | Admitting: Radiology

## 2018-03-16 MED ORDER — AMIODARONE HCL IN DEXTROSE 360-4.14 MG/200ML-% IV SOLN
60.0000 mg/h | INTRAVENOUS | Status: DC
  Administered 2018-03-16 – 2018-03-17 (×2): 60 mg/h via INTRAVENOUS

## 2018-03-16 MED ORDER — AMIODARONE LOAD VIA INFUSION
150.0000 mg | Freq: Once | INTRAVENOUS | Status: AC
  Administered 2018-03-16: 150 mg via INTRAVENOUS
  Filled 2018-03-16: qty 83.34

## 2018-03-16 MED ORDER — LEVALBUTEROL HCL 0.63 MG/3ML IN NEBU
0.6300 mg | INHALATION_SOLUTION | Freq: Two times a day (BID) | RESPIRATORY_TRACT | Status: DC
  Administered 2018-03-16 (×2): 0.63 mg via RESPIRATORY_TRACT
  Filled 2018-03-16 (×3): qty 3

## 2018-03-16 MED ORDER — IOPAMIDOL (ISOVUE-370) INJECTION 76%
100.0000 mL | Freq: Once | INTRAVENOUS | Status: AC | PRN
  Administered 2018-03-16: 100 mL via INTRAVENOUS

## 2018-03-16 MED ORDER — AMIODARONE LOAD VIA INFUSION: 150.0000 mg | Freq: Once | INTRAVENOUS | Status: DC

## 2018-03-16 MED ORDER — AMIODARONE HCL IN DEXTROSE 360-4.14 MG/200ML-% IV SOLN
30.0000 mg/h | INTRAVENOUS | Status: DC
  Administered 2018-03-17: 30 mg/h via INTRAVENOUS
  Filled 2018-03-16 (×2): qty 200

## 2018-03-16 MED ORDER — SODIUM CHLORIDE 0.9% FLUSH: 10.0000 mL | INTRAVENOUS | Status: DC | PRN

## 2018-03-16 MED ORDER — AMIODARONE HCL IN DEXTROSE 360-4.14 MG/200ML-% IV SOLN
INTRAVENOUS | Status: AC
  Filled 2018-03-16: qty 200

## 2018-03-16 MED ORDER — IOPAMIDOL (ISOVUE-370) INJECTION 76%: 100.0000 mL | Freq: Once | INTRAVENOUS | Status: DC | PRN

## 2018-03-16 NOTE — Progress Notes (Signed)
Called by CT tech due to lack of IV access for CTA.  He needs this, if ok, possible d/c in am.  Had bilateral AC IVs, too old to use. One was removed but could not be re-inserted.   Still has one AC IV.  Will order midline, with power injector capability.  Theodore Demark, PA-C 03/16/2018 12:41 PM Beeper [phone]

## 2018-03-16 NOTE — Progress Notes (Signed)
  Amiodarone Drug - Drug Interaction Consult Note  Amiodarone is metabolized by the cytochrome P450 system and therefore has the potential to cause many drug interactions. Amiodarone has an average plasma half-life of 50 days (range 20 to 100 days).   There is potential for drug interactions to occur several weeks or months after stopping treatment and the onset of drug interactions may be slow after initiating amiodarone.  [x]  Beta blockers: increased risk of bradycardia, AV block and myocardial depression. Sotalol - avoid concomitant use.                  - on Metoprolol 50 mg BID.  HR to 143 tonight.  [x]  Diuretics: increased risk of cardiotoxicity if hypokalemia occurs.                - on Lasix 40 mg PO daily and KCl 20 mEq daily.  Last bmet 10/20  Recommendations:    Recheck bmet soon.   Monitor heart rate.  Thank You,  Dennie Fetters, [place] Pager: [phone] 03/16/2018 10:31 PM

## 2018-03-16 NOTE — Progress Notes (Signed)
CARDIAC REHAB PHASE I   PRE:  Rate/Rhythm: 95 SR  BP:  Supine:   Sitting: 131/73  Standing:    SaO2: 96% 5L  MODE:  Ambulation: 200 ft   POST:  Rate/Rhythm: 100 SR  BP:  Supine:   Sitting: 104/52  Standing:    SaO2: 96% 5L,  91-93% 3L, 86%RA 0950-1030 Tried to wean pt to RA without success. Pt was able to tolerate 3L to keep sats up. Left on 3L after walk. Pt did pursed-lip breathing well as he walked. Stopped once to rest. Encouraged pt to not put pressure on his arms. Walked 200 ft with rolling walker with steady gait. Back to recliner . IV team here. Notified RN of decreasing oxygen to 3L.   Luetta Nutting, RN BSN  03/16/2018 10:22 AM

## 2018-03-16 NOTE — Progress Notes (Addendum)
      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  Slept better last night. Continues to have pain.  Objective: Vital signs in last 24 hours: Temp:  [98.3 F (36.8 C)-98.9 F (37.2 C)] 98.7 F (37.1 C) (10/21 0400) Pulse Rate:  [88-99] 88 (10/21 0001) Cardiac Rhythm: Normal sinus rhythm (10/21 0300) Resp:  [14-21] 17 (10/21 0400) BP: (108-132)/(69-79) 132/73 (10/21 0400) SpO2:  [92 %-98 %] 97 % (10/21 0400) Weight:  [108.1 kg] 108.1 kg (10/21 0400)  Intake/Output from previous day: 10/20 0701 - 10/21 0700 In: 500 [P.O.:500] Out: 525 [Urine:525]  General appearance: alert, cooperative and no distress Heart: regular rate and rhythm Lungs: clear to auscultation bilaterally Abdomen: soft, non-tender; bowel sounds normal; no masses,  no organomegaly Extremities: edema trace Wound: clean and dry  Lab Results: Recent Labs    03/14/18 0422  WBC 11.0*  HGB 8.3*  HCT 25.7*  PLT 223   BMET:  Recent Labs    03/14/18 0422 03/15/18 0438  NA 135 136  K 4.4 3.8  CL 100 100  CO2 25 28  GLUCOSE 109* 116*  BUN 29* 23*  CREATININE 1.25* 1.07  CALCIUM 8.5* 8.6*    PT/INR: No results for input(s): LABPROT, INR in the last 72 hours. ABG    Component Value Date/Time   PHART 7.391 03/11/2018 1352   HCO3 21.9 03/11/2018 1352   TCO2 20 (L) 03/11/2018 1653   ACIDBASEDEF 2.0 03/11/2018 1352   O2SAT 96.0 03/11/2018 1352   CBG (last 3)  No results for input(s): GLUCAP in the last 72 hours.  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 CV- NSR, BP okay- continue Lopressor at 50 mg BID 2. Pulm- wean oxygen as tolerated, repeat CXR ordered has not yet been completed 3. Renal- creatinine is stable, weight is trending down, on Lasix 4. Deconditioning- ambulation improving, continue to encourage ambulation 5. Dispo- patient stable, will get CTA C/A/P today, review CXR once completed to assess previous atelectasis, if remains stable today, possibly for d/c in AM   LOS: 6 days    Erin Barrett 03/16/2018  I have seen and examined the patient and agree with the assessment and plan as outlined.  Routine f/u CTA today.  Possible d/c home tomorrow  Purcell Nails, MD 03/16/2018 9:16 AM

## 2018-03-17 LAB — BASIC METABOLIC PANEL
Anion gap: 9 (ref 5–15)
BUN: 16 mg/dL (ref 6–20)
CO2: 27 mmol/L (ref 22–32)
CREATININE: 1.11 mg/dL (ref 0.61–1.24)
Calcium: 8.7 mg/dL — ABNORMAL LOW (ref 8.9–10.3)
Chloride: 97 mmol/L — ABNORMAL LOW (ref 98–111)
Glucose, Bld: 109 mg/dL — ABNORMAL HIGH (ref 70–99)
POTASSIUM: 4.4 mmol/L (ref 3.5–5.1)
Sodium: 133 mmol/L — ABNORMAL LOW (ref 135–145)

## 2018-03-17 LAB — MAGNESIUM: MAGNESIUM: 2.4 mg/dL (ref 1.7–2.4)

## 2018-03-17 MED ORDER — FUROSEMIDE 10 MG/ML IJ SOLN
40.0000 mg | Freq: Once | INTRAMUSCULAR | Status: AC
  Administered 2018-03-17: 40 mg via INTRAVENOUS
  Filled 2018-03-17: qty 4

## 2018-03-17 MED ORDER — METOPROLOL TARTRATE 100 MG PO TABS
100.0000 mg | ORAL_TABLET | Freq: Two times a day (BID) | ORAL | Status: DC
  Administered 2018-03-17 – 2018-03-18 (×2): 100 mg via ORAL
  Filled 2018-03-17 (×2): qty 1

## 2018-03-17 MED ORDER — LEVALBUTEROL HCL 0.63 MG/3ML IN NEBU: 0.6300 mg | INHALATION_SOLUTION | Freq: Four times a day (QID) | RESPIRATORY_TRACT | Status: DC | PRN

## 2018-03-17 MED ORDER — AMIODARONE HCL 200 MG PO TABS
200.0000 mg | ORAL_TABLET | Freq: Two times a day (BID) | ORAL | Status: DC
  Administered 2018-03-17 (×2): 200 mg via ORAL
  Filled 2018-03-17 (×2): qty 1

## 2018-03-17 MED ORDER — LACTULOSE 10 GM/15ML PO SOLN
20.0000 g | Freq: Once | ORAL | Status: AC
  Administered 2018-03-17: 20 g via ORAL
  Filled 2018-03-17: qty 30

## 2018-03-17 NOTE — Progress Notes (Signed)
CARDIAC REHAB PHASE I   Offered to walk with pt, pt declining at this time stating he just got back into bed. Will follow-up as time allows.   Reynold Bowen, RN BSN 03/17/2018 10:27 AM

## 2018-03-17 NOTE — Progress Notes (Addendum)
      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  Short of breath at times.  Per patient has not yet moved his bowels  Objective: Vital signs in last 24 hours: Temp:  [98.5 F (36.9 C)-99.6 F (37.6 C)] 98.8 F (37.1 C) (10/22 0342) Pulse Rate:  [86-153] 89 (10/22 0342) Cardiac Rhythm: Other (Comment) (10/22 0342) Resp:  [18-33] 20 (10/22 0342) BP: (100-136)/(64-84) 111/70 (10/22 0342) SpO2:  [97 %-99 %] 97 % (10/22 0342)  Intake/Output from previous day: 10/21 0701 - 10/22 0700 In: 1320 [P.O.:1320] Out: 950 [Urine:950]  General appearance: alert, cooperative and no distress Heart: regular rate and rhythm Lungs: diminished breath sounds bibasilar Abdomen: soft, non-tender; bowel sounds normal; no masses,  no organomegaly Extremities: edema trace Wound: clean and dry  Lab Results: No results for input(s): WBC, HGB, HCT, PLT in the last 72 hours. BMET:  Recent Labs    03/15/18 0438  NA 136  K 3.8  CL 100  CO2 28  GLUCOSE 116*  BUN 23*  CREATININE 1.07  CALCIUM 8.6*    PT/INR: No results for input(s): LABPROT, INR in the last 72 hours. ABG    Component Value Date/Time   PHART 7.391 03/11/2018 1352   HCO3 21.9 03/11/2018 1352   TCO2 20 (L) 03/11/2018 1653   ACIDBASEDEF 2.0 03/11/2018 1352   O2SAT 96.0 03/11/2018 1352   CBG (last 3)  No results for input(s): GLUCAP in the last 72 hours.  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 CV- developed atrial  fibrillation last night, currently in NSR on Amiodarone drip- will continue Lopressor, start oral Amiodarone at 200 mg BID 2. Pulm- unable to wean off oxygen, currently down to 3L, patient with small bilateral effusions, atelectasis, possible pulmonary edema,  patient with poor use of IS/Flutter valve 3. Renal- creatinine stable, will give IV Lasix today, supplement K 4. Deconditioning- needs to ambulate more frequently, patient continues to take a lot of encouragement to get up 5. Dispo- patient with A. Fib overnight, currently NSR, will transition to oral Amiodarone, treat with IV Lasix today, CTA scan shows stable repair of ascending aorta and stable dissection in the descending aorta with restoration of flow to LLE and kidneys, continue current care, patient needs to move, continue current care   LOS: 7 days    Erin Barrett 03/17/2018   I have seen and examined the patient and agree with the assessment and plan as outlined.  Looking at monitor patient had tachycardia overnight that does not look like Afib but rather Aflutter.  NSR since 8 am this morning.  Will increase metoprolol further.  Still appears volume overloaded.  Baseline weight unknown.  Follow up CT angiogram looks good.  Purcell Nails, MD 03/17/2018 12:18 PM

## 2018-03-17 NOTE — Progress Notes (Signed)
CARDIAC REHAB PHASE I   PRE:  Rate/Rhythm: 88 irreg SR  BP:  Sitting: 125/67    SaO2: 90 RA  MODE:  Ambulation: 240 ft 93 SR  POST:  Rate/Rhythm: 89 irreg SR  BP:  Sitting: 123/72    SaO2: 93 RA   After some encouragement, pt ambulated 232ft in hallway independently with front wheel walker. Pt with slow gait. Maintained O2 sats >90 throughout the walk. Pt in recliner, call bell and phone within reach. Stressed importance of walks and IS, especially  since pt does not want to go home with oxygen. Will continue to follow.  [id number] Reynold Bowen, RN BSN 03/17/2018 2:27 PM

## 2018-03-18 ENCOUNTER — Other Ambulatory Visit: Payer: Self-pay | Admitting: Physician Assistant

## 2018-03-18 DIAGNOSIS — I480 Paroxysmal atrial fibrillation: Secondary | ICD-10-CM

## 2018-03-18 DIAGNOSIS — Z5309 Procedure and treatment not carried out because of other contraindication: Secondary | ICD-10-CM

## 2018-03-18 LAB — BASIC METABOLIC PANEL
Anion gap: 8 (ref 5–15)
BUN: 21 mg/dL — AB (ref 6–20)
CHLORIDE: 99 mmol/L (ref 98–111)
CO2: 26 mmol/L (ref 22–32)
Calcium: 8.6 mg/dL — ABNORMAL LOW (ref 8.9–10.3)
Creatinine, Ser: 1.21 mg/dL (ref 0.61–1.24)
GFR calc Af Amer: >60 mL/min (ref >60)
GFR calc non Af Amer: >60 mL/min (ref >60)
GLUCOSE: 108 mg/dL — AB (ref 70–99)
POTASSIUM: 5 mmol/L (ref 3.5–5.1)
Sodium: 133 mmol/L — ABNORMAL LOW (ref 135–145)

## 2018-03-18 MED ORDER — AMIODARONE HCL 200 MG PO TABS
200.0000 mg | ORAL_TABLET | Freq: Two times a day (BID) | ORAL | Status: DC
  Administered 2018-03-18: 200 mg via ORAL
  Filled 2018-03-18: qty 1

## 2018-03-18 MED ORDER — ASPIRIN 325 MG PO TBEC: 325.0000 mg | DELAYED_RELEASE_TABLET | Freq: Every day | ORAL | 0 refills | Status: DC

## 2018-03-18 MED ORDER — AMIODARONE HCL 200 MG PO TABS: 400.0000 mg | ORAL_TABLET | Freq: Two times a day (BID) | ORAL | Status: DC

## 2018-03-18 MED ORDER — AMIODARONE HCL 200 MG PO TABS: 200.0000 mg | ORAL_TABLET | Freq: Two times a day (BID) | ORAL | Status: DC

## 2018-03-18 MED ORDER — OXYCODONE HCL 10 MG PO TABS: 10.0000 mg | ORAL_TABLET | ORAL | 0 refills | Status: DC | PRN

## 2018-03-18 MED ORDER — DEXTROSE 5 % IV SOLN
150.0000 mg | Freq: Once | INTRAVENOUS | Status: AC
  Administered 2018-03-18: 150 mg via INTRAVENOUS
  Filled 2018-03-18: qty 3

## 2018-03-18 MED ORDER — POTASSIUM CHLORIDE CRYS ER 20 MEQ PO TBCR: 20.0000 meq | EXTENDED_RELEASE_TABLET | Freq: Every day | ORAL | 0 refills | Status: DC

## 2018-03-18 MED ORDER — AMIODARONE HCL 200 MG PO TABS: 200.0000 mg | ORAL_TABLET | Freq: Every day | ORAL | Status: DC

## 2018-03-18 MED ORDER — OXYCODONE HCL 5 MG PO TABS: 5.0000 mg | ORAL_TABLET | ORAL | Status: DC | PRN

## 2018-03-18 MED ORDER — AMIODARONE IV BOLUS ONLY 150 MG/100ML
150.0000 mg | Freq: Once | INTRAVENOUS | Status: DC
  Filled 2018-03-18: qty 100

## 2018-03-18 MED ORDER — AMIODARONE HCL 200 MG PO TABS: 400.0000 mg | ORAL_TABLET | Freq: Two times a day (BID) | ORAL | 1 refills | Status: DC

## 2018-03-18 MED ORDER — AMIODARONE HCL 200 MG PO TABS: 200.0000 mg | ORAL_TABLET | Freq: Two times a day (BID) | ORAL | 1 refills | Status: DC

## 2018-03-18 MED ORDER — FUROSEMIDE 40 MG PO TABS: 40.0000 mg | ORAL_TABLET | Freq: Every day | ORAL | 0 refills | Status: DC

## 2018-03-18 MED ORDER — METOPROLOL TARTRATE 100 MG PO TABS: 100.0000 mg | ORAL_TABLET | Freq: Two times a day (BID) | ORAL | 3 refills | Status: DC

## 2018-03-18 MED ORDER — OXYCODONE HCL 5 MG PO TABS: 5.0000 mg | ORAL_TABLET | ORAL | 0 refills | Status: DC | PRN

## 2018-03-18 MED FILL — METOPROLOL TARTRATE 100 MG: 100 | 30 days supply | Qty: 60 | Fill #0 | Status: TO

## 2018-03-18 MED FILL — AMIODARONE HCL 200 MG TABS: 200 | 30 days supply | Qty: 60 | Fill #0 | Status: TO

## 2018-03-18 MED FILL — POTASSIUM CL ER 20 MEQ TABL: 20 | 7 days supply | Qty: 7 | Fill #0

## 2018-03-18 MED FILL — FUROSEMIDE 40 MG TABLET: 40 | 7 days supply | Qty: 7 | Fill #0

## 2018-03-18 NOTE — Progress Notes (Signed)
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  No significant symptoms and BP stable. I am reluctant to push his dose of metoprolol any higher and I think risks of anticoagulation outweigh the benefits  Plan: Discussed options at length with patient including whether or not to keep him in the hospital again tonight.  He prefers to go home and understands that he might have more PAF after discharge.  Will continue oral amiodarone at discharge.    Purcell Nails, MD 03/18/2018 11:19 AM

## 2018-03-18 NOTE — Progress Notes (Signed)
Chest tube sutures removed. Benzoin and steri strips applied. Patient educated on how to care for steri strips at home. Patient verbalized understanding. Incision sites painted with betadine.   Ernestina Columbia, RN

## 2018-03-18 NOTE — Progress Notes (Addendum)
CCMD called and said patient's HR was running in the 140"s. According to monitor, patient appears to be in A fib.  EKG done. Now NSR. V/S done. Patient is asymptomatic and states he "just had a coughing spell". Lowella Dandy, PA paged at this time, no new orders  K. Doran Clay, RN

## 2018-03-18 NOTE — Progress Notes (Addendum)
CARDIAC REHAB PHASE I    Discharge education completed with pt. Pt educated on importance of showering daily and monitoring incisions. Stressed importance of continued IS use and walking. Pt given in-the-tube sheet, and heart healthy diet. Reviewed restrictions and exercise guidelines. Encouraged smoking cessation, tip sheet given. Pt denies any further questions or concerns. Anxious for possible d/c.   [id number] Reynold Bowen, RN BSN 03/18/2018 9:10 AM

## 2018-03-18 NOTE — Progress Notes (Addendum)
Progress Note  Patient Name: Derrick Clayton Date of Encounter: 03/18/2018  Primary Cardiologist: Dr Purvis Sheffield  Subjective   Anxious for discharge.  Says the food here has no taste  Inpatient Medications    Scheduled Meds: . amiodarone  200 mg Oral BID PC  . aspirin EC  325 mg Oral Daily  . bisacodyl  10 mg Oral Daily   Or  . bisacodyl  10 mg Rectal Daily  . docusate sodium  200 mg Oral Daily  . ferrous fumarate-b12-vitamic C-folic acid  1 capsule Oral TID PC  . metoprolol tartrate  100 mg Oral BID  . pantoprazole  40 mg Oral Daily  . potassium chloride  20 mEq Oral Daily  . sodium chloride flush  3 mL Intravenous Q12H   Continuous Infusions: . lactated ringers Stopped (03/11/18 0749)   PRN Meds: Influenza vac split quadrivalent PF, iopamidol, labetalol, levalbuterol, metoprolol tartrate, ondansetron (ZOFRAN) IV, oxyCODONE, pneumococcal 23 valent vaccine, sodium chloride flush, sodium chloride flush, traMADol, zolpidem   Vital Signs    Vitals:   03/18/18 0844 03/18/18 0944 03/18/18 0955 03/18/18 1202  BP: 120/70 112/67  114/69  Pulse: 84  (!) 107 76  Resp:      Temp: 98.2 F (36.8 C)   98.1 F (36.7 C)  TempSrc: Oral   Oral  SpO2: 92%   94%  Weight:      Height:        Intake/Output Summary (Last 24 hours) at 03/18/2018 1302 Last data filed at 03/18/2018 1002 Gross per 24 hour  Intake 375 ml  Output 875 ml  Net -500 ml   Filed Weights   03/14/18 0447 03/16/18 0400 03/18/18 0553  Weight: 109.9 kg 108.1 kg 108.5 kg    Telemetry    Sinus rhythm, PACs, bigeminy at times, episodes of atrial tachycardia versus atrial flutter noted this a.m.- Personally Reviewed   Physical Exam   General: Well developed, well nourished, male in no acute distress Head: Eyes PERRLA, No xanthomas.   Normocephalic and atraumatic Lungs: Decreased breath sounds bases with some rales. Heart: HRRR S1 S2, without significant MRG.  Upper extremity pulses are 2+ & equal. No  JVD. Abdomen: Bowel sounds are present, abdomen soft and non-tender without masses or  hernias noted. Msk: Normal strength and tone for age. Extremities: No clubbing, cyanosis or edema.    Skin:  No rashes or lesions noted, incisions are healing well. Neuro: Alert and oriented X 3. Psych:  Good affect, responds appropriately   Labs    Chemistry Recent Labs  Lab 03/15/18 0438 03/17/18 0700 03/18/18 0811  NA 136 133* 133*  K 3.8 4.4 5.0  CL 100 97* 99  CO2 28 27 26   GLUCOSE 116* 109* 108*  BUN 23* 16 21*  CREATININE 1.07 1.11 1.21  CALCIUM 8.6* 8.7* 8.6*  GFRNONAA >60 >60 >60  GFRAA >60 >60 >60  ANIONGAP 8 9 8      Hematology Recent Labs  Lab 03/12/18 1620 03/13/18 0301 03/14/18 0422  WBC 13.3* 13.8* 11.0*  RBC 2.98* 3.03* 2.86*  HGB 8.9* 8.9* 8.3*  HCT 26.9* 27.8* 25.7*  MCV 90.3 91.7 89.9  MCH 29.9 29.4 29.0  MCHC 33.1 32.0 32.3  RDW 14.6 14.4 13.6  PLT 174 176 223     Radiology    Dg Chest 2 View  Result Date: 03/16/2018 CLINICAL DATA:  Chest pain shortness of breath EXAM: CHEST - 2 VIEW COMPARISON:  03/14/2018, 03/12/2018, 03/11/2018 FINDINGS: Post sternotomy changes.  Small pleural effusions. Lobulated appearance at the right cardiophrenic angle, could be due to mild loculation of pleural effusion. Similar appearance of patchy bilateral ground-glass opacity and consolidations. Enlarged cardiomediastinal silhouette. No pneumothorax. IMPRESSION: Low lung volumes. Small pleural effusions. Similar appearance of bilateral patchy consolidations and ground-glass opacity which may reflect pulmonary edema or diffuse pneumonia. There remains dense bibasilar consolidation, slightly increased at the left base. Electronically Signed   By: Jasmine Pang M.D.   On: 03/16/2018 16:53   Ct Angio Chest/abd/pel For Dissection W And/or W/wo  Result Date: 03/16/2018 CLINICAL DATA:  Type A dissection of the aorta and status post replacement of the ascending thoracic aorta on  03/11/2018. EXAM: CT ANGIOGRAPHY CHEST, ABDOMEN AND PELVIS TECHNIQUE: Multidetector CT imaging through the chest, abdomen and pelvis was performed using the standard protocol during bolus administration of intravenous contrast. Multiplanar reconstructed images and MIPs were obtained and reviewed to evaluate the vascular anatomy. CONTRAST:  ISOVUE-370 IOPAMIDOL (ISOVUE-370) INJECTION 76% COMPARISON:  None. FINDINGS: CTA CHEST FINDINGS Cardiovascular: The replaced ascending thoracic aortic graft appears normally patent. No evidence of anastomotic pseudoaneurysm. There is residual dissection beginning at the level of the arch and continuing into the descending thoracic aorta. Dissection also extends into the innominate artery without obvious flow limitation at the level of the right subclavian or right common carotid arteries. Caliber of the aorta at the level of the arch and descending thoracic aorta is stable. Heart size is stable and at the upper limits of normal. No pericardial fluid identified. Central pulmonary arteries are normal in caliber. Mediastinum/Nodes: No lymphadenopathy identified. Lungs/Pleura: Small bilateral pleural effusions present. Ground-glass airspace opacity in predominantly upper lobes bilaterally likely reflects some degree of pulmonary edema. Bilateral lower lobe atelectasis present, right greater than left. No pneumothorax. Musculoskeletal: Status post median sternotomy. No acute or significant osseous findings. Review of the MIP images confirms the above findings. CTA ABDOMEN AND PELVIS FINDINGS VASCULAR Aorta: Dissection continues in the abdominal aorta with anterior true lumen and posterior false lumen. The abdominal aorta is not dilated. There are some displaced intimal calcifications in the distal aorta. Celiac: Celiac axis originates off of the true lumen and is normally patent. SMA: SMA originates off of the true lumen and is normally patent. Renals: Single right renal artery  originates off of the true lumen and appears normally patent. A single left renal artery appears to originate partly off of the true lumen and partly off the false lumen with probable extension of dissection just into the proximal left renal artery. On the arterial phase of imaging, no obvious ischemia of the left kidney. IMA: Patent and originates off of the true lumen. Inflow: Left-sided iliac arteries now essentially show normal patency with mild component of dissection just extending into the proximal left common iliac artery. The internal and external iliac arteries now demonstrate normal patency with resolution of lack of flow in the external iliac artery seen previously. The common femoral artery and femoral bifurcation are normally patent. On the right side, there may just barely be extension of dissection into the posterior origin of the right common iliac artery. The right-sided iliac arteries show normal patency with scattered atherosclerosis. The common femoral artery and femoral bifurcation are normally patent. Review of the MIP images confirms the above findings. NON-VASCULAR Hepatobiliary: No focal liver abnormality is seen. No gallstones, gallbladder wall thickening, or biliary dilatation. Pancreas: Unremarkable. No pancreatic ductal dilatation or surrounding inflammatory changes. Spleen: Normal in size without focal abnormality. Adrenals/Urinary Tract: Adrenal  glands are unremarkable. Kidneys are normal, without renal calculi, focal lesion, or hydronephrosis. Bladder is unremarkable. Stomach/Bowel: Bowel appears unremarkable and shows no evidence of obstruction, ileus, inflammation or thickening. No free air identified. Lymphatic: No enlarged lymph nodes identified. Reproductive: Prostate is unremarkable. Other: No abnormal fluid collections.  No hernias identified. Musculoskeletal: No acute or significant osseous findings. Review of the MIP images confirms the above findings. IMPRESSION: 1.  Normally patent aortic graft after replacement of the ascending thoracic aorta. 2. Residual dissection beginning at the level of the aortic arch and extending into the innominate artery. Dissection extends through the descending thoracic aorta, abdominal aorta and just into bilateral proximal common iliac arteries. 3. Dissection likely extends just into the proximal aspect of the left renal artery without evidence of obvious left renal ischemia. 4. Resolved ischemia of the left lower extremity with now widely patent external iliac artery compared to the prior study. 5. Small bilateral pleural effusions and probable component of pulmonary edema with ground-glass airspace opacity present in both lungs, predominantly in the upper lung zones bilaterally. Electronically Signed   By: Irish Lack M.D.   On: 03/16/2018 17:38    Patient Profile     37 y.o. male with PMH HTN admitted with Type A aortic dissection.  Assessment & Plan    1 s/p repair type 1 aortic dissection- -Dissection involved right carotid, celiac axis, SMA and bilateral iliac arteries. 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 -He has a follow-up appointment with cardiology scheduled on 11/4.  2 HTN- -Blood pressures under good control on current therapy  3 Hyperkalemia-  -Potassium was 4.8 at one-point now 5.0. - Would discontinue potassium supplement here, decrease as outpatient  4.  PAF: -It is unclear if he is having atrial tachycardia, or atypical atrial flutter. - He is on metoprolol, blood pressure limits titration -Per Dr. Cornelius Moras, risks outweigh benefits of anticoagulation  Discharge per [facility], keep follow-up appointment  For questions or updates, please contact [facility] Please consult  [email or web address] for contact info under     Signed, Theodore Demark, PA-C  03/18/2018, 1:02 PM    The patient was seen, examined and discussed with Theodore Demark, PA-C and I agree with the above.   37 yo male post type A dissection repair (as above) on 03/10/18, hypertension, PAF being discharged today, he had a short episode of a-fib with RVR this am 9:30-10:15 am, now in SR, on amiodarone PO, I would increase to 400 mg po BID x 5 days, then 200 mg PO BID x 5 days, then 200 mg po daily, continue metoprolol 200 mg PO BID, ASA 325 mg po daily, not on anticoagulation with significant risk of bleeding. BP is well controlled, HR 80-100 BPM. We will arrange for an early outpatient follow up to adjust meds as needed. Stable for a discharge from cardiac standpoint.   Tobias Alexander, MD 03/18/2018

## 2018-03-18 NOTE — Progress Notes (Signed)
[facility] RN called unit and spoke with Lawerance Cruel, RN. Educated midline can be discontinued by unit RN following same procedure as discontinuing a PIV. Lawerance Cruel, RN verbalized she would take line out as ordered.

## 2018-03-18 NOTE — Progress Notes (Addendum)
      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  States he is restless and just cant rest here.  Wants to go home.  He has moved his bowels 3 times after lactulose  Objective: Vital signs in last 24 hours: Temp:  [98.2 F (36.8 C)-98.9 F (37.2 C)] 98.2 F (36.8 C) (10/23 0549) Pulse Rate:  [84-128] 84 (10/23 0549) Cardiac Rhythm: Normal sinus rhythm (10/23 0700) Resp:  [18] 18 (10/23 0026) BP: (106-119)/(46-68) 119/65 (10/23 0549) SpO2:  [88 %-98 %] 88 % (10/23 0549) Weight:  [108.5 kg] 108.5 kg (10/23 0553)  Intake/Output from previous day: 10/22 0701 - 10/23 0700 In: 375 [I.V.:375] Out: 1125 [Urine:1125]  General appearance: alert, cooperative and no distress Heart: regular rate and rhythm Lungs: diminished breath sounds bibasilar Abdomen: soft, non-tender; bowel sounds normal; no masses,  no organomegaly Extremities: edema trace, improved from yesterday. Wound: clean an dry  Lab Results: No results for input(s): WBC, HGB, HCT, PLT in the last 72 hours. BMET:  Recent Labs    03/17/18 0700  NA 133*  K 4.4  CL 97*  CO2 27  GLUCOSE 109*  BUN 16  CREATININE 1.11  CALCIUM 8.7*    PT/INR: No results for input(s): LABPROT, INR in the last 72 hours. ABG    Component Value Date/Time   PHART 7.391 03/11/2018 1352   HCO3 21.9 03/11/2018 1352   TCO2 20 (L) 03/11/2018 1653   ACIDBASEDEF 2.0 03/11/2018 1352   O2SAT 96.0 03/11/2018 1352   CBG (last 3)  No results for input(s): GLUCAP in the last 72 hours.  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 CV- no further atrial Fibrillation, maintaining NSR- continue oral Amiodarone, Lopressor 2. Pulm- off oxygen, continue aggressive use of flutter valve and IS at discharge 3. Renal- creatinine bumped to 1.11, likely due to IV Lasix, will transition to oral Lasix, and continue for 7 days post discharge 4. Deconditioning- improved, patient is ambulating without difficulty 5. Dispo- patient stable, continue lasix at discharge, he has moved bowels, feeling better but is restless, wants to go home today, will d/c if okay with Dr. Cornelius Moras    LOS: 8 days    Lowella Dandy 03/18/2018   I have seen and examined the patient and agree with the assessment and plan as outlined.  D/C home today.  Will stop amiodarone.  D/C instructions reviewed with patient.  Importance of long term attention to management of hypertension discussed.  Purcell Nails, MD 03/18/2018 9:18 AM

## 2018-03-18 NOTE — Progress Notes (Signed)
Midline and telemetry discontinued at this time. CCMD notified. Patient tolerated well. Discharge instructions reviewed with patient. All questions answered.

## 2018-03-23 ENCOUNTER — Emergency Department (HOSPITAL_COMMUNITY): Payer: Self-pay

## 2018-03-23 ENCOUNTER — Emergency Department (HOSPITAL_COMMUNITY)
Admission: EM | Admit: 2018-03-23 | Discharge: 2018-03-23 | Disposition: A | Discharge status: Home / Self Care | Payer: Self-pay | Attending: Emergency Medicine | Admitting: Emergency Medicine

## 2018-03-23 ENCOUNTER — Ambulatory Visit (INDEPENDENT_AMBULATORY_CARE_PROVIDER_SITE_OTHER): Payer: Self-pay | Admitting: Family Medicine

## 2018-03-23 ENCOUNTER — Encounter (HOSPITAL_COMMUNITY): Payer: Self-pay | Admitting: *Deleted

## 2018-03-23 ENCOUNTER — Other Ambulatory Visit: Payer: Self-pay

## 2018-03-23 ENCOUNTER — Encounter: Payer: Self-pay | Admitting: Family Medicine

## 2018-03-23 VITALS — BP 110/82 | HR 69 | Temp 97.5°F | Resp 20 | Ht 73.0 in | Wt 238.0 lb

## 2018-03-23 DIAGNOSIS — Z833 Family history of diabetes mellitus: Secondary | ICD-10-CM

## 2018-03-23 DIAGNOSIS — I71019 Dissection of thoracic aorta, unspecified: Secondary | ICD-10-CM

## 2018-03-23 DIAGNOSIS — Z9889 Other specified postprocedural states: Secondary | ICD-10-CM

## 2018-03-23 DIAGNOSIS — R609 Edema, unspecified: Secondary | ICD-10-CM

## 2018-03-23 DIAGNOSIS — I10 Essential (primary) hypertension: Secondary | ICD-10-CM | POA: Insufficient documentation

## 2018-03-23 DIAGNOSIS — Z87891 Personal history of nicotine dependence: Secondary | ICD-10-CM | POA: Insufficient documentation

## 2018-03-23 DIAGNOSIS — R2243 Localized swelling, mass and lump, lower limb, bilateral: Secondary | ICD-10-CM | POA: Insufficient documentation

## 2018-03-23 DIAGNOSIS — I7101 Dissection of thoracic aorta: Secondary | ICD-10-CM

## 2018-03-23 DIAGNOSIS — R0602 Shortness of breath: Secondary | ICD-10-CM

## 2018-03-23 DIAGNOSIS — Z8481 Family history of carrier of genetic disease: Secondary | ICD-10-CM

## 2018-03-23 LAB — CBC
HCT: 27.7 % — ABNORMAL LOW (ref 39.0–52.0)
Hemoglobin: 8.4 g/dL — ABNORMAL LOW (ref 13.0–17.0)
MCH: 29.1 pg (ref 26.0–34.0)
MCHC: 30.3 g/dL (ref 30.0–36.0)
MCV: 95.8 fL (ref 80.0–100.0)
NRBC: 0.4 % — AB (ref 0.0–0.2)
Platelets: 517 10*3/uL — ABNORMAL HIGH (ref 150–400)
RBC: 2.89 MIL/uL — ABNORMAL LOW (ref 4.22–5.81)
RDW: 15.3 % (ref 11.5–15.5)
WBC: 14.2 10*3/uL — AB (ref 4.0–10.5)

## 2018-03-23 LAB — BASIC METABOLIC PANEL
Anion gap: 8 (ref 5–15)
BUN: 19 mg/dL (ref 6–20)
CO2: 23 mmol/L (ref 22–32)
CREATININE: 1.14 mg/dL (ref 0.61–1.24)
Calcium: 8.9 mg/dL (ref 8.9–10.3)
Chloride: 106 mmol/L (ref 98–111)
Glucose, Bld: 91 mg/dL (ref 70–99)
POTASSIUM: 4.8 mmol/L (ref 3.5–5.1)
SODIUM: 137 mmol/L (ref 135–145)

## 2018-03-23 LAB — I-STAT TROPONIN, ED: TROPONIN I, POC: 0.04 ng/mL (ref 0.00–0.08)

## 2018-03-23 LAB — POCT GLYCOSYLATED HEMOGLOBIN (HGB A1C): Hemoglobin A1C: 5.3 % (ref 4.0–5.6)

## 2018-03-23 LAB — BRAIN NATRIURETIC PEPTIDE: B Natriuretic Peptide: 519.7 pg/mL — ABNORMAL HIGH (ref 0.0–100.0)

## 2018-03-23 MED ORDER — FUROSEMIDE 80 MG PO TABS: 80.0000 mg | ORAL_TABLET | Freq: Two times a day (BID) | ORAL | 0 refills | Status: DC

## 2018-03-23 MED ORDER — POTASSIUM CHLORIDE CRYS ER 20 MEQ PO TBCR: 20.0000 meq | EXTENDED_RELEASE_TABLET | Freq: Every day | ORAL | 2 refills | Status: DC

## 2018-03-23 MED ORDER — FUROSEMIDE 80 MG PO TABS: 80.0000 mg | ORAL_TABLET | Freq: Every day | ORAL | 2 refills | Status: DC

## 2018-03-23 NOTE — ED Notes (Signed)
Did not answer when called for vitals 

## 2018-03-23 NOTE — ED Notes (Signed)
Pt verbalized understanding of discharge paperwork. Prescriptions reviewed and diet instructions discussed with pt. Ambulatory on discharge

## 2018-03-23 NOTE — Progress Notes (Signed)
[facility]  New Patient Encounter Provider: Mike Gip, FNP    WUJ:[id number]  NFA:[id number]  DOB - 07-Feb-1981  SUBJECTIVE:   Derrick Clayton, is a 37 y.o. male who presents to establish care with this clinic.   Current problems/concerns:    Patient presents s/p type Repair of Acute Ascending Aortic Dissection 03/11/2018. Patient states that his legs have been swollen since having surgery. Was getting IV lasix while in the hospital. He is now taking 40 mg of lasix po QD. Patient states that he is having chest "tensing" that he describes as his skin being too tight. Patient states that he is also having an increase in coughing and shortness of breath. His SOB is worsened with lying down. He states that he is compliant with medications.   No Known Allergies Past Medical History:  Diagnosis Date  . Acute thoracic aortic dissection (HCC) 03/10/2018  . Hypertension   . S/P aortic dissection repair 03/11/2018   Straight graft replacement of ascending thoracic aorta with resuspension of native aortic valve and open hemi-arch distal anastomosis   Current Outpatient Medications on File Prior to Visit  Medication Sig Dispense Refill  . acetaminophen (TYLENOL) 500 MG tablet Take 500 mg by mouth every 6 (six) hours as needed (pain).    Marland Kitchen amiodarone (PACERONE) 200 MG tablet Take 2 tablets (400 mg total) by mouth 2 (two) times daily after a meal. X 5 days, then decrease to 200 mg BID x 5 days, then decrease to 200 mg daily 60 tablet 1  . furosemide (LASIX) 40 MG tablet Take 1 tablet (40 mg total) by mouth daily. 7 tablet 0  . metoprolol tartrate (LOPRESSOR) 100 MG tablet Take 1 tablet (100 mg total) by mouth 2 (two) times daily. 60 tablet 3  . oxyCODONE (OXY IR/ROXICODONE) 5 MG immediate release tablet Take 1-2 tablets (5-10 mg total) by mouth every 4 (four) hours as needed for severe pain. 30 tablet 0  . oxyCODONE 10 MG TABS Take 1 tablet (10 mg  total) by mouth every 4 (four) hours as needed for severe pain. 30 tablet 0  . potassium chloride SA (K-DUR,KLOR-CON) 20 MEQ tablet Take 1 tablet (20 mEq total) by mouth daily. 7 tablet 0  . aspirin EC 325 MG EC tablet Take 1 tablet (325 mg total) by mouth daily. (Patient not taking: Reported on 03/23/2018) 30 tablet 0   No current facility-administered medications on file prior to visit.    Family History  Problem Relation Age of Onset  . Cancer Mother   . Heart failure Father    Social History   Socioeconomic History  . Marital status: Single    Spouse name: Not on file  . Number of children: Not on file  . Years of education: Not on file  . Highest education level: Not on file  Occupational History  . Not on file  Social Needs  . Financial resource strain: Not on file  . Food insecurity:    Worry: Not on file    Inability: Not on file  . Transportation needs:    Medical: Not on file    Non-medical: Not on file  Tobacco Use  . Smoking status: Former Smoker    Packs/day: 0.00    Types: Cigarettes  . Smokeless tobacco: Never Used  Substance and Sexual Activity  . Alcohol use: Not Currently    Comment: 3-4 days a week  . Drug use: Yes  Types: Marijuana    Comment: daily  . Sexual activity: Not on file  Lifestyle  . Physical activity:    Days per week: Not on file    Minutes per session: Not on file  . Stress: Not on file  Relationships  . Social connections:    Talks on phone: Not on file    Gets together: Not on file    Attends religious service: Not on file    Active member of club or organization: Not on file    Attends meetings of clubs or organizations: Not on file    Relationship status: Not on file  . Intimate partner violence:    Fear of current or ex partner: Not on file    Emotionally abused: Not on file    Physically abused: Not on file    Forced sexual activity: Not on file  Other Topics Concern  . Not on file  Social History Narrative  . Not  on file    Review of Systems  Constitutional: Negative.   HENT: Negative.   Eyes: Negative.   Respiratory: Positive for cough and shortness of breath. Negative for wheezing.   Cardiovascular: Negative.   Gastrointestinal: Negative.   Genitourinary: Negative.   Musculoskeletal: Negative.   Skin: Negative.   Neurological: Negative.   Psychiatric/Behavioral: Negative.      OBJECTIVE:    BP 110/82 (BP Location: Left Arm, Patient Position: Sitting, Cuff Size: Large)   Pulse 69   Temp (!) 97.5 F (36.4 C) (Oral)   Resp 20   Ht 6\' 1"  (1.854 m)   Wt 238 lb (108 kg)   SpO2 99%   BMI 31.40 kg/m   Physical Exam  Constitutional: He is well-developed, well-nourished, and in no distress.  Cardiovascular: Normal rate, regular rhythm, S1 normal and S2 normal. Exam reveals decreased pulses (bilateral pedal pulses. ).  There is bilateral 2+ pitting edema noted to the lower extremities.   Pulmonary/Chest: Accessory muscle usage present. No respiratory distress. He has decreased breath sounds in the right lower field and the left lower field. He has no wheezes. He has no rhonchi. He has no rales.  Abdominal: Soft. Normal appearance. There is no tenderness.  Musculoskeletal:       Right ankle: He exhibits swelling.       Left foot: There is swelling.  Patient is ambulating slowly.   Psychiatric: Mood, memory, affect and judgment normal.     ASSESSMENT/PLAN: 1. Essential hypertension Suspect fluid overload. He has decreased lung sounds in the bilateral lower lobes.  Recommended EMS transport. Patient refused. Girlfriend to transport. Discussed the risks of private transport.  - furosemide (LASIX) 80 MG tablet; Take 1 tablet (80 mg total) by mouth daily.  Dispense: 30 tablet; Refill: 2 - potassium chloride SA (K-DUR,KLOR-CON) 20 MEQ tablet; Take 1 tablet (20 mEq total) by mouth daily.  Dispense: 30 tablet; Refill: 2 - Comprehensive metabolic panel - Urinalysis Dipstick  2. Acute  thoracic aortic dissection (HCC) - 3. S/P repair ascending aortic dissection Continue follow-up with specialist.  4. Family history of breast cancer gene mutation in first degree relative Genetic testing ordered.   5. Family history of diabetes mellitus (DM) A1C 5.3 today.  - HgB A1c  6. Edema, unspecified type Sent to ED.  - DG Chest 2 View; Future  7. Shortness of breath Sent to ED  The patient was given clear instructions to go to ER or return to medical center if symptoms don't improve, worsen  or new problems develop. The patient verbalized understanding. The patient was told to call to get lab results if they haven't heard anything in the next week.     This note has been created with Education officer, environmental. Any transcriptional errors are unintentional.   Ms. Andr L. Riley Lam, FNP-BC [facility] [address] [place], [place] [address] [phone]

## 2018-03-23 NOTE — Discharge Instructions (Addendum)
We are increasing her Lasix dose for 1 week.  This can drop your potassium, so make sure that you eat foods which contain plenty of potassium.  Also it is important to avoid salt, as this will tend to make you retain fluid.  See the attached instructions about a low-sodium diet.  For the swelling your legs try to elevate your feet above your heart is much as possible during the day.  Also consider getting some compression stockings to wear to help mobilize the fluid.  Follow-up with your chest surgeon, next week as scheduled.

## 2018-03-23 NOTE — ED Notes (Signed)
Pt is back in A hallway

## 2018-03-23 NOTE — ED Notes (Signed)
Pt was observed leaving his seat in A hall and walking out out to the lobby.  Did not answer in lobby when called for Xray.

## 2018-03-23 NOTE — ED Provider Notes (Signed)
[facility] EMERGENCY DEPARTMENT Provider Note   CSN: [id number] Arrival date & time: 03/23/18  1437     History   Chief Complaint Chief Complaint  Patient presents with  . Shortness of Breath  . Leg Swelling    HPI Derrick Clayton is a 37 y.o. male.   He is here for evaluation of peripheral edema, at the suggestion of his PCP.  He saw the PCP today, for a checkup.  He has had ongoing cough productive of sputum occasionally.  He denies fever, chills, nausea or vomiting.  He denies significant shortness of breath.  He is recovering from aortic dissection repair with thoracic graft and resuspension of his aortic valve.  He states he is compliant with his medication.  He is eating well.  He is able to ambulate and take care of himself at home.  Plans on seeing his chest surgeon next week.  He saw the PCP today who sent him here.  There are no other known modifying factors.  HPI  Past Medical History:  Diagnosis Date  . Acute thoracic aortic dissection (HCC) 03/10/2018  . Hypertension   . S/P aortic dissection repair 03/11/2018   Straight graft replacement of ascending thoracic aorta with resuspension of native aortic valve and open hemi-arch distal anastomosis    Patient Active Problem List   Diagnosis Date Noted  . S/P repair ascending aortic dissection 03/11/2018  . Acute thoracic aortic dissection (HCC) 03/10/2018  . Uncontrolled hypertension 03/10/2018  . TOBACCO DEPENDENCE 07/24/2006  . Essential hypertension 07/24/2006    Past Surgical History:  Procedure Laterality Date  . AORTIC INTERVENTION N/A 03/10/2018   Procedure: RESUSPENSION OF THE NATIVE AORTIC VALVE;  Surgeon: Purcell Nails, MD;  Location: [facility] OR;  Service: Open Heart Surgery;  Laterality: N/A;  . AORTIC VALVE REPAIR N/A 03/10/2018   Procedure: REPAIR OF ASCENDING AORTIC DISECTION USING STRAIGHT HEMASHIELD PLATINUM VASCULAR GRAFT; OPEN HEMIARCH DISTAL ANASTOMOSIS;  Surgeon: Purcell Nails, MD;  Location: [facility] OR;  Service: Open Heart Surgery;  Laterality: N/A;  . FOOT SURGERY          Home Medications    Prior to Admission medications   Medication Sig Start Date End Date Taking? Authorizing Provider  acetaminophen (TYLENOL) 500 MG tablet Take 500 mg by mouth every 6 (six) hours as needed (pain).    [provider]  amiodarone (PACERONE) 200 MG tablet Take 2 tablets (400 mg total) by mouth 2 (two) times daily after a meal. X 5 days, then decrease to 200 mg BID x 5 days, then decrease to 200 mg daily 03/18/18   Barrett, Rae Roam, PA-C  aspirin EC 325 MG EC tablet Take 1 tablet (325 mg total) by mouth daily. Patient not taking: Reported on 03/23/2018 03/18/18   Barrett, Rae Roam, PA-C  furosemide (LASIX) 80 MG tablet Take 1 tablet (80 mg total) by mouth 2 (two) times daily. This is a temporary dosage change 03/23/18 04/22/18  Mancel Bale, MD  metoprolol tartrate (LOPRESSOR) 100 MG tablet Take 1 tablet (100 mg total) by mouth 2 (two) times daily. 03/18/18   Barrett, Erin R, PA-C  oxyCODONE (OXY IR/ROXICODONE) 5 MG immediate release tablet Take 1-2 tablets (5-10 mg total) by mouth every 4 (four) hours as needed for severe pain. 03/18/18   Barrett, Erin R, PA-C  oxyCODONE 10 MG TABS Take 1 tablet (10 mg total) by mouth every 4 (four) hours as needed for severe pain. 03/18/18  Barrett, Erin R, PA-C  potassium chloride SA (K-DUR,KLOR-CON) 20 MEQ tablet Take 1 tablet (20 mEq total) by mouth daily. 03/23/18   Mike Gip, FNP    Family History Family History  Problem Relation Age of Onset  . Cancer Mother   . Heart failure Father     Social History Social History   Tobacco Use  . Smoking status: Former Smoker    Packs/day: 0.00    Types: Cigarettes  . Smokeless tobacco: Never Used  Substance Use Topics  . Alcohol use: Not Currently    Comment: 3-4 days a week  . Drug use: Yes    Types: Marijuana    Comment: daily     Allergies   Patient has no  known allergies.   Review of Systems Review of Systems  All other systems reviewed and are negative.    Physical Exam Updated Vital Signs BP (!) 152/88   Pulse 69   Temp 98.5 F (36.9 C) (Oral)   Resp (!) 25   SpO2 96%   Physical Exam  Constitutional: He is oriented to person, place, and time. He appears well-developed and well-nourished. He does not appear ill.  HENT:  Head: Normocephalic and atraumatic.  Right Ear: External ear normal.  Left Ear: External ear normal.  Eyes: Pupils are equal, round, and reactive to light. Conjunctivae and EOM are normal.  Neck: Normal range of motion and phonation normal. Neck supple.  Cardiovascular: Normal rate, regular rhythm and normal heart sounds.  Pulmonary/Chest: Effort normal and breath sounds normal. No stridor. No respiratory distress. He has no wheezes. He exhibits no bony tenderness.  Healing midline chest and upper abdomen wounds from recent surgery.  No evidence for acute infection.  Abdominal: Soft. There is no tenderness.  Musculoskeletal: Normal range of motion. He exhibits edema (Plus lower leg edema bilaterally.). He exhibits no tenderness or deformity.  Neurological: He is alert and oriented to person, place, and time. No cranial nerve deficit or sensory deficit. He exhibits normal muscle tone. Coordination normal.  Skin: Skin is warm, dry and intact.  Psychiatric: He has a normal mood and affect. His behavior is normal. Judgment and thought content normal.  Nursing note and vitals reviewed.    ED Treatments / Results  Labs (all labs ordered are listed, but only abnormal results are displayed) Labs Reviewed  BRAIN NATRIURETIC PEPTIDE - Abnormal; Notable for the following components:      Result Value   B Natriuretic Peptide 519.7 (*)    All other components within normal limits  CBC - Abnormal; Notable for the following components:   WBC 14.2 (*)    RBC 2.89 (*)    Hemoglobin 8.4 (*)    HCT 27.7 (*)     Platelets 517 (*)    nRBC 0.4 (*)    All other components within normal limits  BASIC METABOLIC PANEL  I-STAT TROPONIN, ED    EKG EKG Interpretation  Date/Time:  Monday March 23 2018 14:41:32 EDT Ventricular Rate:  68 PR Interval:  160 QRS Duration: 82 QT Interval:  458 QTC Calculation: 487 R Axis:   100 Text Interpretation:  Normal sinus rhythm Rightward axis Prolonged QT Abnormal ECG since last tracing no significant change Confirmed by Mancel Bale [phone]) on 03/23/2018 6:24:05 PM   Radiology Dg Chest 2 View  Result Date: 03/23/2018 CLINICAL DATA:  Shortness of breath.  Prior cardiac surgery. EXAM: CHEST - 2 VIEW COMPARISON:  03/16/2018 FINDINGS: Improved aeration at the right lung  base. However, small to moderate bilateral pleural effusions are still present. No pneumothorax. Mild enlargement of the cardiopericardial silhouette. Prior median sternotomy. Prior hazy alveolar opacities in the lungs have resolved. IMPRESSION: 1. Small to moderate bilateral pleural effusions. Improved aeration at the right lung base. 2. Interval clearing of the hazy alveolar opacities shown on the prior exam. 3. Mild enlargement of the cardiopericardial silhouette without edema. Electronically Signed   By: Gaylyn Rong M.D.   On: 03/23/2018 16:45    Procedures Procedures (including critical care time)  Medications Ordered in ED Medications - No data to display   Initial Impression / Assessment and Plan / ED Course  I have reviewed the triage vital signs and the nursing notes.  Pertinent labs & imaging results that were available during my care of the patient were reviewed by me and considered in my medical decision making (see chart for details).      Patient Vitals for the past 24 hrs:  BP Temp Temp src Pulse Resp SpO2  03/23/18 1817 (!) 152/88 - - 69 (!) 25 96 %  03/23/18 1445 (!) 129/94 98.5 F (36.9 C) Oral 66 20 99 %    6:43 PM Reevaluation with update and discussion.  After initial assessment and treatment, an updated evaluation reveals he is comfortable has no further complaints.  Pain is discussed and questions answered. Mancel Bale   Medical Decision Making: Peripheral edema, with chest x-ray improving.  Weight is stable from 1 week ago at discharge.  Doubt significant fluid overload.  Patient appears to be third spacing.  Will increase Lasix, for 1 week and encourage follow-up with PCP.  Patient instructed to increase his potassium intake.  Also instructed on low-salt diet.  CRITICAL CARE-no Performed by: Mancel Bale  Nursing Notes Reviewed/ Care Coordinated Applicable Imaging Reviewed Interpretation of Laboratory Data incorporated into ED treatment  The patient appears reasonably screened and/or stabilized for discharge and I doubt any other medical condition or other [facility] requiring further screening, evaluation, or treatment in the ED at this time prior to discharge.  Plan: Home Medications-continue usual, double Lasix dose for 1 week; Home Treatments-low-salt high potassium diet; return here if the recommended treatment, does not improve the symptoms; Recommended follow up-PCP 3 to 4 days and CVTS next week.   Final Clinical Impressions(s) / ED Diagnoses   Final diagnoses:  Peripheral edema    ED Discharge Orders         Ordered    furosemide (LASIX) 80 MG tablet  2 times daily     03/23/18 Alycia Patten, MD 03/23/18 1845

## 2018-03-23 NOTE — Patient Instructions (Signed)
Pulmonary Edema °Pulmonary edema is abnormal fluid buildup in the lungs that can make it hard to breathe. °Follow these instructions at home: °· Talk to your doctor about an exercise program. °· Eat a healthy diet: °? Eat fresh fruits, vegetables, and lean meats. °? Limit high fat and salty foods. °? Avoid processed, canned, or fried foods. °? Avoid fast food. °· Follow your doctor's advice about taking medicine and recording the medicine you take. °· Follow your doctor's advice about keeping a record of your weight. °· Talk to your doctor about keeping track of your blood pressure. °· Do not smoke. °· Do not use nicotine patches or nicotine gum. °· Make a follow-up appointment with your doctor. °· Ask your doctor for a copy of your latest heart tracing (ECG) and keep a copy with you at all times. °Get help right away if: °· You have chest pain. THIS IS AN EMERGENCY. Do not wait to see if the pain will go away. Call for local emergency medical help. Do not drive yourself to the hospital. °· You have sweating, feel sick to your stomach (nauseous), or are experiencing shortness of breath. °· Your weight increases more than your doctor tells you it should. °· You start to have shortness of breath. °· You notice more swelling in your hands, feet, ankles, or belly. °· You have dizziness, blurred vision, headache, or unsteadiness that does not go away. °· You cough up bloody spit. °· You have a cough that does not go away. °· You are unable to sleep because it is hard to breathe. °· You begin to feel a “jumping” or “fluttering” sensation (palpitations) in the chest that is unusual for you. °This information is not intended to replace advice given to you by your health care provider. Make sure you discuss any questions you have with your health care provider. °Document Released: 05/01/2009 Document Revised: 10/19/2015 Document Reviewed: 01/18/2013 °Elsevier Interactive Patient Education © 2018 [employer] ° °

## 2018-03-23 NOTE — ED Triage Notes (Signed)
Pt reports recent heart surgery. Now has productive cough and swelling to his legs, no relief with lasix. Airway intact at triage.

## 2018-03-30 ENCOUNTER — Ambulatory Visit (INDEPENDENT_AMBULATORY_CARE_PROVIDER_SITE_OTHER): Payer: Self-pay | Admitting: Cardiology

## 2018-03-30 ENCOUNTER — Encounter: Payer: Self-pay | Admitting: Cardiology

## 2018-03-30 VITALS — BP 126/74 | HR 65 | Ht 70.0 in | Wt 225.0 lb

## 2018-03-30 DIAGNOSIS — I10 Essential (primary) hypertension: Secondary | ICD-10-CM

## 2018-03-30 DIAGNOSIS — Z7189 Other specified counseling: Secondary | ICD-10-CM

## 2018-03-30 DIAGNOSIS — I471 Supraventricular tachycardia: Secondary | ICD-10-CM

## 2018-03-30 DIAGNOSIS — Z79899 Other long term (current) drug therapy: Secondary | ICD-10-CM

## 2018-03-30 DIAGNOSIS — Z9889 Other specified postprocedural states: Secondary | ICD-10-CM

## 2018-03-30 LAB — BASIC METABOLIC PANEL
BUN/Creatinine Ratio: 14 (ref 9–20)
BUN: 15 mg/dL (ref 6–20)
CALCIUM: 9.8 mg/dL (ref 8.7–10.2)
CO2: 22 mmol/L (ref 20–29)
Chloride: 98 mmol/L (ref 96–106)
Creatinine, Ser: 1.11 mg/dL (ref 0.76–1.27)
GFR, EST AFRICAN AMERICAN: 97 mL/min/{1.73_m2} (ref >59)
GFR, EST NON AFRICAN AMERICAN: 84 mL/min/{1.73_m2} (ref >59)
Glucose: 91 mg/dL (ref 65–99)
POTASSIUM: 4.7 mmol/L (ref 3.5–5.2)
Sodium: 138 mmol/L (ref 134–144)

## 2018-03-30 MED ORDER — FUROSEMIDE 80 MG PO TABS: ORAL_TABLET | ORAL | 3 refills | Status: DC

## 2018-03-30 MED ORDER — FUROSEMIDE 80 MG PO TABS: 80.0000 mg | ORAL_TABLET | ORAL | 3 refills | Status: DC | PRN

## 2018-03-30 NOTE — Progress Notes (Signed)
Cardiology Office Note:    Date:  03/30/2018   ID:  Derrick Clayton, DOB August 18, 1980, MRN [id number]  PCP:  Mike Gip, FNP  Cardiologist:  Jodelle Red, MD PhD  Referring MD: Lowella Dandy, PA  CC: establish care, post hospital follow up.   History of Present Illness:    Derrick Clayton is a 37 y.o. male with a hx of hypertension, acute ascending aortic dissection s/p repair 02/2018 who is seen as a new consult at the request of Lowella Dandy, PA for the evaluation and management of post hospital follow up.  He presented to the ER with chest pain and hypertensive emergency. He had severe chest and foot pain, and he was found to have an ascending aortic dissection and no doppler flow to the left foot. Dissection also involved right carotid, celiac axis, SMA and bilateral iliac arteries. He was emergently taken for REPAIR OF ASCENDING AORTIC DISECTION USING STRAIGHT HEMASHIELD PLATINUM VASCULAR GRAFT; OPEN HEMIARCH DISTAL ANASTOMOSIS AND RESUSPENSION OF THE NATIVE AORTIC VALVE. His hospitalization was notable for possible atrial tachycardia vs. Flutter, but risk was considered to outweigh benefit of anticoagulation. He presented to the ER on 03/23/18 for peripheral leg edema and has lasix increased for a week.  Overall has been doing well. Chest tenderness when he presses on his chest and when he coughs. Usually dry cough, sometimes sounds wet. Had PND before, now better. No orthopnea. LE edema improving.  Past Medical History:  Diagnosis Date  . Acute thoracic aortic dissection (HCC) 03/10/2018  . Hypertension   . S/P aortic dissection repair 03/11/2018   Straight graft replacement of ascending thoracic aorta with resuspension of native aortic valve and open hemi-arch distal anastomosis    Past Surgical History:  Procedure Laterality Date  . AORTIC INTERVENTION N/A 03/10/2018   Procedure: RESUSPENSION OF THE NATIVE AORTIC VALVE;  Surgeon: Purcell Nails, MD;  Location: [facility] OR;   Service: Open Heart Surgery;  Laterality: N/A;  . AORTIC VALVE REPAIR N/A 03/10/2018   Procedure: REPAIR OF ASCENDING AORTIC DISECTION USING STRAIGHT HEMASHIELD PLATINUM VASCULAR GRAFT; OPEN HEMIARCH DISTAL ANASTOMOSIS;  Surgeon: Purcell Nails, MD;  Location: [facility] OR;  Service: Open Heart Surgery;  Laterality: N/A;  . FOOT SURGERY      Current Medications: Current Outpatient Medications on File Prior to Visit  Medication Sig  . acetaminophen (TYLENOL) 500 MG tablet Take 500 mg by mouth every 6 (six) hours as needed (pain).  Marland Kitchen amiodarone (PACERONE) 200 MG tablet Take 2 tablets (400 mg total) by mouth 2 (two) times daily after a meal. X 5 days, then decrease to 200 mg BID x 5 days, then decrease to 200 mg daily  . aspirin EC 325 MG EC tablet Take 1 tablet (325 mg total) by mouth daily.  . metoprolol tartrate (LOPRESSOR) 100 MG tablet Take 1 tablet (100 mg total) by mouth 2 (two) times daily.  Marland Kitchen oxyCODONE 10 MG TABS Take 1 tablet (10 mg total) by mouth every 4 (four) hours as needed for severe pain.  . potassium chloride SA (K-DUR,KLOR-CON) 20 MEQ tablet Take 1 tablet (20 mEq total) by mouth daily.   No current facility-administered medications on file prior to visit.      Allergies:   Patient has no known allergies.   Social History   Socioeconomic History  . Marital status: Single    Spouse name: Not on file  . Number of children: Not on file  . Years of education: Not on file  .  Highest education level: Not on file  Occupational History  . Not on file  Social Needs  . Financial resource strain: Not on file  . Food insecurity:    Worry: Not on file    Inability: Not on file  . Transportation needs:    Medical: Not on file    Non-medical: Not on file  Tobacco Use  . Smoking status: Former Smoker    Packs/day: 0.00    Types: Cigarettes  . Smokeless tobacco: Never Used  Substance and Sexual Activity  . Alcohol use: Not Currently    Comment: 3-4 days a week  . Drug  use: Yes    Types: Marijuana    Comment: daily  . Sexual activity: Not on file  Lifestyle  . Physical activity:    Days per week: Not on file    Minutes per session: Not on file  . Stress: Not on file  Relationships  . Social connections:    Talks on phone: Not on file    Gets together: Not on file    Attends religious service: Not on file    Active member of club or organization: Not on file    Attends meetings of clubs or organizations: Not on file    Relationship status: Not on file  Other Topics Concern  . Not on file  Social History Narrative  . Not on file     Family History: The patient's family history includes Cancer in his mother; Heart failure in his father.  ROS:   Please see the history of present illness.  Additional pertinent ROS:  Constitutional: Negative for chills, fever, night sweats, unintentional weight loss  HENT: Negative for ear pain and hearing loss.   Eyes: Negative for loss of vision and eye pain.  Respiratory: Positive for cough. Negative for sputum, shortness of breath, wheezing.   Cardiovascular: Positive for LE edema. Negative for chest pain, palpitations , PND, orthopnea, and claudication.  Gastrointestinal: Negative for abdominal pain, melena, and hematochezia.  Genitourinary: Negative for dysuria and hematuria.  Musculoskeletal: Negative for falls and myalgias.  Skin: Negative for itching and rash.  Neurological: Negative for focal weakness and loss of consciousness. Does have some sensation abnormalities in his legs Endo/Heme/Allergies: Does not bruise/bleed easily.    EKGs/Labs/Other Studies Reviewed:    The following studies were reviewed today: Recent hospitalization/surgery reports Intraop TEE Echo Findings   Left Ventricle LV systolic function is low normal with an EF of 50-55%.  Septum No atrial septal defect present. No Patent Foramen Ovale present.  Aortic Valve The valve is trileaflet. Mild to moderate regurgitation.    Aorta Dissection present  Mitral Valve Normal valve structure. Trace regurgitation.  Tricuspid Valve Normal tricuspid valve structure.     EKG:  The ekg ordered previously demonstrates normal sinus rhythm, borderline QTc  Recent Labs: 03/10/2018: ALT 40 03/17/2018: Magnesium 2.4 03/23/2018: B Natriuretic Peptide 519.7; Hemoglobin 8.4; Platelets 517 03/30/2018: BUN 15; Creatinine, Ser 1.11; Potassium 4.7; Sodium 138  Recent Lipid Panel No results found for: CHOL, TRIG, HDL, CHOLHDL, VLDL, LDLCALC, LDLDIRECT  Physical Exam:    VS:  BP 126/74 (BP Location: Left Arm, Patient Position: Sitting, Cuff Size: Normal)   Pulse 65   Ht 5\' 10"  (1.778 m)   Wt 225 lb (102.1 kg)   BMI 32.28 kg/m     Wt Readings from Last 3 Encounters:  03/30/18 225 lb (102.1 kg)  03/23/18 238 lb (108 kg)  03/18/18 239 lb 3.2 oz (108.5  kg)     GEN: Well nourished, well developed in no acute distress HEENT: Normal NECK: No JVD; No carotid bruits LYMPHATICS: No lymphadenopathy CARDIAC: regular rhythm, normal S1 and S2, no murmurs, rubs, gallops. Radial and DP pulses 2+ bilaterally. RESPIRATORY:  Clear to auscultation without rales, wheezing or rhonchi  ABDOMEN: Soft, non-tender, non-distended MUSCULOSKELETAL:  Trace bilateral LE edema; No deformity  SKIN: Warm and dry NEUROLOGIC:  Alert and oriented x 3 PSYCHIATRIC:  Normal affect   ASSESSMENT:    1. S/P repair ascending aortic dissection   2. Essential hypertension   3. Medication management   4. Atrial tachycardia (HCC)   5. Counseling on health promotion and disease prevention    PLAN:    1. Type A aortic dissection, s/p repair: doing well overall. Edema resolving, weight down ~13 lbs since discharge. Working on BP control.   -pending f/u with CT surgery  -BP 126/74. On metoprolol. Given that he presented with uncontrolled hypertension and has aortic pathology, will restart ACEi at lower dose and titrate as tolerated  -on aspirin 325 mg. Will  defer to surgery on when to drop to 81 mg dose.   -was on 80 mg lasix daily (was not taking BID). Given that he is 13 lbs down and edema improving, will change to weight based dosing:  Only take your water pill if you notice a weight gain of 3 pounds in 1 day or 5 lbs in 3 days. If you notice that you need to use your water pill often, call the clinic.  -counseled on salt avoidance and fluid restriction  -would check echo at future visit  2. Atrial tachycardia vs. Flutter:  -Chadsvasc=2. Was not started on anticoagulation given recent surgery and unclear etiology of the tachycardia  -continue amiodarone for 3 mos post op. If no symptoms, stop amiodarone. If symptoms occur after stopping amiodarone, or if confirmation needed, consider 7 day Zio after amiodarone washout   3. Hypertension: -BMP checked, renal function and potassium normal. Will restart lisinopril 5mg .   4. Prev  -Secondary prevention: -recommend heart healthy/Mediterranean diet, with whole grains, fruits, vegetable, fish, lean meats, nuts, and olive oil. Limit salt. -recommend moderate walking, 3-5 times/week for 30-50 minutes each session. Aim for at least 150 minutes.week. Goal should be pace of 3 miles/hours, or walking 1.5 miles in 30 minutes -recommend avoidance of tobacco products. Avoid excess alcohol. -Additional risk factor control:  -Diabetes: A1c is 5.3  -Lipids: will need checked at his next visit.   -Blood pressure control: as above  -Weight: BMI 32  Plan for follow up: 3 mos  Medication Adjustments/Labs and Tests Ordered: Current medicines are reviewed at length with the patient today.  Concerns regarding medicines are outlined above.  Orders Placed This Encounter  Procedures  . Basic metabolic panel   Meds ordered this encounter  Medications  . DISCONTD: furosemide (LASIX) 80 MG tablet    Sig: Take 1 tablet (80 mg total) by mouth as needed for up to 7 days for fluid or edema (If your weight is up 3 lbs  overnight or 5 lbs in 3 or more days, take 80 mg furosemide daily until your weight returns to normal.). This is a temporary dosage change    Dispense:  90 tablet    Refill:  3  . DISCONTD: furosemide (LASIX) 80 MG tablet    Sig: Take 1 tablet (80 mg total) by mouth as needed for up to 7 days for fluid or edema (If  your weight is up 3 lbs overnight or 5 lbs in 3 or more days, take 80 mg furosemide daily until your weight returns to normal.). This is a temporary dosage change    Dispense:  90 tablet    Refill:  3  . DISCONTD: furosemide (LASIX) 80 MG tablet    Sig: Take 1 tablet (80 mg total) by mouth as needed for up to 7 days for fluid or edema (3 lbs overnight or 5 lbs in 3 days, take 80 mg furosemide daily). This is a temporary dosage change    Dispense:  90 tablet    Refill:  3  . furosemide (LASIX) 80 MG tablet    Sig: If your weight is up 3 lbs overnight or 5 lbs in 3 or more days, take 80 mg furosemide daily until your weight returns to normal.    Dispense:  90 tablet    Refill:  3    Patient Instructions  Medication Instructions:   If your weight is up 3 lbs overnight or 5 lbs in 3 or more days, take 80 mg furosemide daily until your weight returns to normal. -if the leg swelling returns, take the 80 mg furosemide daily. -if the leg swelling or weight stays on after 5 days in a row of taking furosemide, give the office a call.  If you need a refill on your cardiac medications before your next appointment, please call your pharmacy.   Lab work: Your physician recommends that you return for lab work today (BMP)  If you have labs (blood work) drawn today and your tests are completely normal, you will receive your results only by: Marland Kitchen MyChart Message (if you have MyChart) OR . A paper copy in the mail If you have any lab test that is abnormal or we need to change your treatment, we will call you to review the results.  Testing/Procedures: None  Follow-Up: At [facility], you  and your health needs are our priority.  As part of our continuing mission to provide you with exceptional heart care, we have created designated Provider Care Teams.  These Care Teams include your primary Cardiologist (physician) and Advanced Practice Providers (APPs -  Physician Assistants and Nurse Practitioners) who all work together to provide you with the care you need, when you need it. You will need a follow up appointment in 3 months.  Please call our office 2 months in advance to schedule this appointment.  You may see Jodelle Red, MD or one of the following Advanced Practice Providers on your designated Care Team:   Theodore Demark, PA-C . Joni Reining, DNP, ANP  Any Other Special Instructions Will Be Listed Below (If Applicable). -If blood work is good, we will restart lisinopril 5 mg daily. -Goal BP <130/80 and ideally <120/80 -weight yourself daily. Mark down what your normal weight is. -avoid salt in your food. Processed/packaged foods have a lot of salt--it's not just adding salt to food -monitor how much fluid you are drinking. Aim for less than 2 liters of fluid per day. -can try mucinex/guiafenisin for the cough    Signed, Jodelle Red, MD PhD 03/30/2018 10:17 PM    [facility]

## 2018-03-30 NOTE — Patient Instructions (Addendum)
Medication Instructions:   If your weight is up 3 lbs overnight or 5 lbs in 3 or more days, take 80 mg furosemide daily until your weight returns to normal. -if the leg swelling returns, take the 80 mg furosemide daily. -if the leg swelling or weight stays on after 5 days in a row of taking furosemide, give the office a call.  If you need a refill on your cardiac medications before your next appointment, please call your pharmacy.   Lab work: Your physician recommends that you return for lab work today (BMP)  If you have labs (blood work) drawn today and your tests are completely normal, you will receive your results only by: Marland Kitchen MyChart Message (if you have MyChart) OR . A paper copy in the mail If you have any lab test that is abnormal or we need to change your treatment, we will call you to review the results.  Testing/Procedures: None  Follow-Up: At [facility], you and your health needs are our priority.  As part of our continuing mission to provide you with exceptional heart care, we have created designated Provider Care Teams.  These Care Teams include your primary Cardiologist (physician) and Advanced Practice Providers (APPs -  Physician Assistants and Nurse Practitioners) who all work together to provide you with the care you need, when you need it. You will need a follow up appointment in 3 months.  Please call our office 2 months in advance to schedule this appointment.  You may see Jodelle Red, MD or one of the following Advanced Practice Providers on your designated Care Team:   Theodore Demark, PA-C . Joni Reining, DNP, ANP  Any Other Special Instructions Will Be Listed Below (If Applicable). -If blood work is good, we will restart lisinopril 5 mg daily. -Goal BP <130/80 and ideally <120/80 -weight yourself daily. Mark down what your normal weight is. -avoid salt in your food. Processed/packaged foods have a lot of salt--it's not just adding salt to  food -monitor how much fluid you are drinking. Aim for less than 2 liters of fluid per day. -can try mucinex/guiafenisin for the cough

## 2018-03-31 ENCOUNTER — Encounter: Payer: Self-pay | Admitting: Cardiology

## 2018-03-31 DIAGNOSIS — I471 Supraventricular tachycardia: Secondary | ICD-10-CM | POA: Insufficient documentation

## 2018-04-01 ENCOUNTER — Telehealth: Payer: Self-pay

## 2018-04-01 DIAGNOSIS — Z79899 Other long term (current) drug therapy: Secondary | ICD-10-CM

## 2018-04-01 MED ORDER — LISINOPRIL 5 MG PO TABS: 5.0000 mg | ORAL_TABLET | Freq: Every day | ORAL | 3 refills | Status: DC

## 2018-04-01 NOTE — Telephone Encounter (Signed)
-----   Message from Jodelle Red, MD sent at 03/30/2018  5:52 PM EST ----- Normal Cr/K on BMET. Let's start 5 mg lisinopril daily and have him recheck his BMET one week after he starts the lisinopril. Thanks.

## 2018-04-01 NOTE — Telephone Encounter (Signed)
Pt updated with lab results along with Dr. Di Kindle recommendation. Pt verbalized understanding and orders placed.   Pt wanted to know since he is no longer taking the lasix as much, do he still have to take the potassium pills? Will route to MD.

## 2018-04-02 NOTE — Telephone Encounter (Signed)
Left message to call back  

## 2018-04-02 NOTE — Telephone Encounter (Signed)
As long as the water pill is only occasional, he doesn't need to take the potassium. If he is taking the lasix more than three times/week, then I would take potassium only when he takes the lasix.

## 2018-04-03 NOTE — Telephone Encounter (Signed)
Pt updated with MD's recommendation. Verbalized understanding.

## 2018-04-10 ENCOUNTER — Other Ambulatory Visit: Payer: Self-pay | Admitting: Thoracic Surgery (Cardiothoracic Vascular Surgery)

## 2018-04-10 DIAGNOSIS — Z9889 Other specified postprocedural states: Secondary | ICD-10-CM

## 2018-04-11 LAB — BASIC METABOLIC PANEL
BUN/Creatinine Ratio: 10 (ref 9–20)
BUN: 12 mg/dL (ref 6–20)
CALCIUM: 10.3 mg/dL — AB (ref 8.7–10.2)
CHLORIDE: 97 mmol/L (ref 96–106)
CO2: 23 mmol/L (ref 20–29)
CREATININE: 1.2 mg/dL (ref 0.76–1.27)
GFR calc Af Amer: 88 mL/min/{1.73_m2} (ref >59)
GFR calc non Af Amer: 76 mL/min/{1.73_m2} (ref >59)
Glucose: 89 mg/dL (ref 65–99)
Potassium: 4.8 mmol/L (ref 3.5–5.2)
Sodium: 137 mmol/L (ref 134–144)

## 2018-04-13 ENCOUNTER — Encounter: Payer: Self-pay | Admitting: Thoracic Surgery (Cardiothoracic Vascular Surgery)

## 2018-04-13 ENCOUNTER — Ambulatory Visit
Admission: RE | Admit: 2018-04-13 | Discharge: 2018-04-13 | Disposition: A | Discharge status: Home / Self Care | Payer: Self-pay | Source: Ambulatory Visit | Attending: Thoracic Surgery (Cardiothoracic Vascular Surgery) | Admitting: Thoracic Surgery (Cardiothoracic Vascular Surgery)

## 2018-04-13 ENCOUNTER — Ambulatory Visit (INDEPENDENT_AMBULATORY_CARE_PROVIDER_SITE_OTHER): Payer: Self-pay | Admitting: Thoracic Surgery (Cardiothoracic Vascular Surgery)

## 2018-04-13 ENCOUNTER — Other Ambulatory Visit: Payer: Self-pay

## 2018-04-13 VITALS — BP 110/70 | HR 64 | Resp 18 | Ht 70.0 in | Wt 219.0 lb

## 2018-04-13 DIAGNOSIS — Z9889 Other specified postprocedural states: Secondary | ICD-10-CM

## 2018-04-13 NOTE — Patient Instructions (Signed)
Continue all previous medications without any changes at this time  Continue to avoid any heavy lifting or strenuous use of your arms or shoulders for at least a total of three months from the time of surgery.  After three months you may gradually increase how much you lift or otherwise use your arms or chest as tolerated, with limits based upon whether or not activities lead to the return of significant discomfort.  You may return to driving an automobile as long as you are no longer requiring oral narcotic pain relievers during the daytime.  It would be wise to start driving only short distances during the daylight and gradually increase from there as you feel comfortable.  You are encouraged to enroll and participate in the outpatient cardiac rehab program beginning as soon as practical.  You are advised regarding the need for life long follow-up and physical restrictions related to the history of aortic dissection including the need for strict blood pressure control, periodic radiographic follow-up examinations, and avoidance of certain types of physical activity such as heavy lifting or other strenuous physical activities which tend to increase pressure within the chest or abdomen.

## 2018-04-13 NOTE — Progress Notes (Signed)
[address]       [place] [address]             [phone]     CARDIOTHORACIC SURGERY OFFICE NOTE  Referring Provider is Laqueta Linden, MD  Primary Cardiologist is Jodelle Red, MD PCP is Mike Gip, FNP   HPI:  Patient is a 37 year old African-American male with history of hypertension who returns to the office today for routine follow-up status post emergency repair of acute type a aortic dissection with resuspension of the native aortic valve and open hemi-arch distal anastomosis on March 11, 2018.  The patient presented with classical symptoms of chest pain and back pain as well as severe left lower extremity pain related to malperfusion.  The patient's lower extremity ischemia resolved with surgical repair of his aortic dissection.  His early postoperative recovery was otherwise notable for some transient atrial tachycardia for which she was treated with amiodarone.  He never had any documented atrial fibrillation or atrial flutter.  He was eventually discharged home on the eighth postoperative day.  Several days later he was sent to the emergency department from his primary care physician's office because of lower extremity swelling which was present at the time of hospital discharge.  No significant intervention was required.  More recently the patient was seen in follow-up by Dr. Cristal Deer who has been managing his hypertension.  He returns for office today and reports that he is doing exceptionally well.  He still has soreness across his chest but overall he feels good.  He denies any shortness of breath.  Appetite is normal.  He has continued to lose weight on oral diuretic therapy and his lower extremity swelling has essentially completely resolved.  He has not had any palpitations or dizzy spells.  He still has mild numbness along the medial aspect of his left lower leg but this continues to improve.   Current Outpatient Medications    Medication Sig Dispense Refill  . acetaminophen (TYLENOL) 500 MG tablet Take 500 mg by mouth every 6 (six) hours as needed (pain).    Marland Kitchen amiodarone (PACERONE) 200 MG tablet Take 2 tablets (400 mg total) by mouth 2 (two) times daily after a meal. X 5 days, then decrease to 200 mg BID x 5 days, then decrease to 200 mg daily 60 tablet 1  . aspirin EC 325 MG EC tablet Take 1 tablet (325 mg total) by mouth daily. 30 tablet 0  . furosemide (LASIX) 80 MG tablet If your weight is up 3 lbs overnight or 5 lbs in 3 or more days, take 80 mg furosemide daily until your weight returns to normal. 90 tablet 3  . lisinopril (PRINIVIL,ZESTRIL) 5 MG tablet Take 1 tablet (5 mg total) by mouth daily. 90 tablet 3  . metoprolol tartrate (LOPRESSOR) 100 MG tablet Take 1 tablet (100 mg total) by mouth 2 (two) times daily. 60 tablet 3  . potassium chloride SA (K-DUR,KLOR-CON) 20 MEQ tablet Take 1 tablet (20 mEq total) by mouth daily. 30 tablet 2   No current facility-administered medications for this visit.       Physical Exam:   BP 110/70 (BP Location: Left Arm, Patient Position: Sitting, Cuff Size: Normal)   Pulse 64   Resp 18   Ht 5\' 10"  (1.778 m)   Wt 219 lb (99.3 kg)   SpO2 98% Comment: RA  BMI 31.42 kg/m   General:  Well-appearing  Chest:   Clear to  auscultation  CV:   Regular rate and rhythm, grade 2/6 systolic murmur  Incisions:  Healing nicely, sternum stable  Abdomen:  Soft nontender  Extremities:  Warm and well-perfused  Diagnostic Tests:  CHEST - 2 VIEW  COMPARISON:  PA and lateral chest x-ray of March 23 2018  FINDINGS: The lungs are well-expanded. There is decreased density in the right lower lobe. The pleural effusions have resolved. Patchy density at the left lung base has also cleared. The heart is top-normal in size. The pulmonary vascularity is normal. The mediastinum is normal in width. The sternal wires are intact. The bony thorax exhibits no acute  abnormality.  IMPRESSION: Improving but not completely cleared right lower lobe atelectasis or pneumonia. No CHF. Normal appearing mediastinum.  An additional follow-up chest x-ray in 3-4 weeks is recommended to assure complete clearing.   Electronically Signed   By: David  SwazilandJordan M.D.   On: 04/13/2018 14:52    Impression:  Patient doing very well approximately 1 month status post emergency repair of acute type a aortic dissection.  Blood pressure is under good control.  Plan:  I have encouraged the patient to continue to gradually increase his physical activity as tolerated.  However, I have reminded the patient that he should refrain from any heavy lifting or strenuous use of his arms or shoulders indefinitely.  I think he may resume driving an automobile once he is no longer requiring oral narcotic pain relievers for pain control.  I have encouraged him to enroll and participate in the cardiac rehab program, but he is reluctant because he does not currently have insurance.  We have discussed the plan for his physical rehab on his own to include gradually increasing aerobic physical activity including walking, stationary bicycle, and other stationary activities.  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  All of his questions been addressed.  We have not recommended any change to the patient's current medications.  The patient will return to our office in approximately 2 months for routine follow-up CT angiogram.     Salvatore Decentlarence H. Cornelius Moraswen, MD 04/13/2018 3:12 PM

## 2018-04-17 ENCOUNTER — Ambulatory Visit (INDEPENDENT_AMBULATORY_CARE_PROVIDER_SITE_OTHER): Payer: Self-pay | Admitting: Family Medicine

## 2018-04-17 ENCOUNTER — Encounter: Payer: Self-pay | Admitting: Family Medicine

## 2018-04-17 VITALS — BP 143/79 | HR 59 | Temp 98.2°F | Resp 16 | Ht 73.0 in | Wt 221.0 lb

## 2018-04-17 DIAGNOSIS — I7101 Dissection of thoracic aorta: Secondary | ICD-10-CM

## 2018-04-17 DIAGNOSIS — I10 Essential (primary) hypertension: Secondary | ICD-10-CM

## 2018-04-17 DIAGNOSIS — Z9889 Other specified postprocedural states: Secondary | ICD-10-CM

## 2018-04-17 DIAGNOSIS — I71019 Dissection of thoracic aorta, unspecified: Secondary | ICD-10-CM

## 2018-04-17 DIAGNOSIS — Z23 Encounter for immunization: Secondary | ICD-10-CM

## 2018-04-17 NOTE — Progress Notes (Signed)
  [facility]  Provider: Mike GipAndre Wanya Bangura, FNP   Hypertension Follow Up Visit  SUBJECTIVE:  Derrick Clayton is a 37 y.o. male who  has a past medical history of Acute thoracic aortic dissection (HCC) (03/10/2018), Hypertension, and S/P aortic dissection repair (03/11/2018).   Patient presents for follow up on HTN. He has been seen by Dr. Cristal Deerhristopher, cardiologist. He does not check his BP at home. Patient was sent to ED after last visit due to increased swelling, SOB and cough.  Patient seen by cardiothoracic surgeon on 04/03/2018. Instructed to increase activity and encouraged to enroll in cardiac rehab program.  Patient denies leg swelling, dizziness or SOB. Patient states that he is doing well and without complaints today.  Patient took his medications 30minutes prior to this encounter.   Hypertension ROS: Review of Systems  Constitutional: Negative.   HENT: Negative.   Eyes: Negative.   Respiratory: Negative.   Cardiovascular: Negative.   Gastrointestinal: Negative.   Genitourinary: Negative.   Musculoskeletal: Negative.   Skin: Negative.   Neurological: Negative.   Psychiatric/Behavioral: Negative.      OBJECTIVE:   BP (!) 149/84 (BP Location: Right Arm, Patient Position: Sitting, Cuff Size: Normal)   Pulse (!) 59   Temp 98.2 F (36.8 C) (Oral)   Resp 16   Ht 6\' 1"  (1.854 m)   Wt 221 lb (100.2 kg)   SpO2 100%   BMI 29.16 kg/m    Filed Weights   04/17/18 1313  Weight: 221 lb (100.2 kg)   03/30/2018 225lbs    Physical Exam  Constitutional: He is oriented to person, place, and time and well-developed, well-nourished, and in no distress. No distress.  HENT:  Head: Normocephalic and atraumatic.  Eyes: Pupils are equal, round, and reactive to light. Conjunctivae and EOM are normal.  Neck: Normal range of motion. Neck supple.  Cardiovascular: Normal rate, regular rhythm and intact distal pulses. Exam reveals no gallop  and no friction rub.  No murmur heard. Pulmonary/Chest: Effort normal and breath sounds normal. No respiratory distress. He has no wheezes.  Abdominal: Soft. Bowel sounds are normal. There is no tenderness.  Musculoskeletal: Normal range of motion. He exhibits no edema or tenderness.  Lymphadenopathy:    He has no cervical adenopathy.  Neurological: He is alert and oriented to person, place, and time. Gait normal.  Skin: Skin is warm and dry.  Psychiatric: Mood, memory, affect and judgment normal.  Nursing note and vitals reviewed.    ASSESSMENT/PLAN:  1. Essential hypertension Advised patient to take medication daily as prescribed. Discussed low sodium diet.   2. Acute thoracic aortic dissection (HCC) Continue with current medications. Followed by Cardiothoracic surgeon.  Lasix instructions reiterated. Continue with ACEI and metoprolol. Amiodarone to be d/c'd after 3 months per CT surgery.  3. S/P repair ascending aortic dissection Cardiac rehab recommended.   4. Need for Tdap vaccination Vaccination given.  - Tdap vaccine greater than or equal to 7yo IM    The patient is asked to make an attempt to improve diet and exercise patterns to aid in medical management of this problem.  Return to care as scheduled and prn. Patient verbalized understanding and agreed with plan of care.    Ms. [name], FNP-BC [facility] [address]  [place], [place] [id number] [phone]

## 2018-04-17 NOTE — Patient Instructions (Signed)

## 2018-05-07 ENCOUNTER — Other Ambulatory Visit: Payer: Self-pay | Admitting: Thoracic Surgery (Cardiothoracic Vascular Surgery)

## 2018-05-07 DIAGNOSIS — I712 Thoracic aortic aneurysm, without rupture, unspecified: Secondary | ICD-10-CM

## 2018-06-15 ENCOUNTER — Encounter: Payer: Self-pay | Admitting: Thoracic Surgery (Cardiothoracic Vascular Surgery)

## 2018-06-15 ENCOUNTER — Ambulatory Visit
Admission: RE | Admit: 2018-06-15 | Discharge: 2018-06-15 | Disposition: A | Discharge status: Home / Self Care | Payer: No Typology Code available for payment source | Source: Ambulatory Visit | Attending: Thoracic Surgery (Cardiothoracic Vascular Surgery) | Admitting: Thoracic Surgery (Cardiothoracic Vascular Surgery)

## 2018-06-15 ENCOUNTER — Ambulatory Visit (INDEPENDENT_AMBULATORY_CARE_PROVIDER_SITE_OTHER): Payer: Self-pay | Admitting: Thoracic Surgery (Cardiothoracic Vascular Surgery)

## 2018-06-15 ENCOUNTER — Other Ambulatory Visit: Payer: Self-pay

## 2018-06-15 VITALS — BP 147/82 | HR 62 | Resp 18 | Ht 73.0 in | Wt 220.0 lb

## 2018-06-15 DIAGNOSIS — I712 Thoracic aortic aneurysm, without rupture, unspecified: Secondary | ICD-10-CM

## 2018-06-15 DIAGNOSIS — Z9889 Other specified postprocedural states: Secondary | ICD-10-CM

## 2018-06-15 MED ORDER — IOPAMIDOL (ISOVUE-370) INJECTION 76%
75.0000 mL | Freq: Once | INTRAVENOUS | Status: AC | PRN
  Administered 2018-06-15: 75 mL via INTRAVENOUS

## 2018-06-15 NOTE — Progress Notes (Signed)
[address]       [place] [address]             [phone]     CARDIOTHORACIC SURGERY OFFICE NOTE  Referring Provider is Laqueta Linden, MD  Primary Cardiologist is Jodelle Red, MD PCP is Mike Gip, FNP   HPI:  Patient is a 38 year old African-American male with history of hypertension who returns to the office today for routine follow-up status post emergency repair of acute type a aortic dissection with resuspension of the native aortic valve and open hemi-arch distal anastomosis on March 11, 2018.  He was last seen in our office on April 13, 2018 at which time he was recovering fairly well.  He returns to our office today and reports that he feels fairly good.  He is back at work and he reports fairly good exercise tolerance and no significant residual pain in his chest.  He still has a slight amount of numbness along the left lateral aspect of his left thigh and hip.  He states that all of his other symptoms have resolved.  He states that he only checks his blood pressure at home intermittently but he does have a blood pressure cuff.  Overall he is pleased with his progress.   Current Outpatient Medications  Medication Sig Dispense Refill  . acetaminophen (TYLENOL) 500 MG tablet Take 500 mg by mouth every 6 (six) hours as needed (pain).    Marland Kitchen amiodarone (PACERONE) 200 MG tablet Take 2 tablets (400 mg total) by mouth 2 (two) times daily after a meal. X 5 days, then decrease to 200 mg BID x 5 days, then decrease to 200 mg daily 60 tablet 1  . aspirin EC 325 MG EC tablet Take 1 tablet (325 mg total) by mouth daily. 30 tablet 0  . furosemide (LASIX) 80 MG tablet If your weight is up 3 lbs overnight or 5 lbs in 3 or more days, take 80 mg furosemide daily until your weight returns to normal. 90 tablet 3  . lisinopril (PRINIVIL,ZESTRIL) 5 MG tablet Take 1 tablet (5 mg total) by mouth daily. 90 tablet 3  . metoprolol tartrate (LOPRESSOR) 100  MG tablet Take 1 tablet (100 mg total) by mouth 2 (two) times daily. 60 tablet 3  . potassium chloride SA (K-DUR,KLOR-CON) 20 MEQ tablet Take 1 tablet (20 mEq total) by mouth daily. 30 tablet 2   No current facility-administered medications for this visit.       Physical Exam:   BP (!) 147/82 (BP Location: Right Arm, Patient Position: Sitting, Cuff Size: Large)   Pulse 62   Resp 18   Ht 6\' 1"  (1.854 m)   Wt 220 lb (99.8 kg)   SpO2 98% Comment: RA  BMI 29.03 kg/m   General:  Well-appearing  Chest:   Clear to auscultation  CV:   Regular rate and rhythm without murmur  Incisions:  Well-healed, sternum is stable  Abdomen:  Soft nontender  Extremities:  Warm and well-perfused  Diagnostic Tests:  CT ANGIOGRAPHY CHEST, ABDOMEN AND PELVIS  TECHNIQUE: Multidetector CT imaging through the chest, abdomen and pelvis was performed using the standard protocol during bolus administration of intravenous contrast. Multiplanar reconstructed images and MIPs were obtained and reviewed to evaluate the vascular anatomy.  CONTRAST:  32mL ISOVUE-370 IOPAMIDOL (ISOVUE-370) INJECTION 76%  Creatinine was obtained on site at [facility] at [address]  Results: Creatinine 1.1 mg/dL. GFR 98  COMPARISON:  03/16/2018 and previous  FINDINGS: CTA CHEST FINDINGS  Cardiovascular: Heart size normal. No pericardial effusion. Fair contrast opacification of pulmonary artery branches; the exam was not optimized for detection of pulmonary emboli. Coronary calcifications. Stable changes of ascending aortic repair. Persistent dissection flap in the arch extending through the descending thoracic aorta. The dissection flap extends into the right brachiocephalic artery for, subclavian and axillary arteries, and in the left subclavian and axillary arteries. Progressive compression of the true lumen of the aorta by the false lumen in the proximal descending thoracic segment. Progressive  aneurysmal dilatation of the distal arch/proximal descending segment to 3.9 cm diameter (previously 3.2 cm). Mid descending measures 3.8 cm, previously 3.1.  Mediastinum/Nodes: No hilar or mediastinal adenopathy.  Lungs/Pleura: No pleural effusion. No pneumothorax. Small subpleural blebs in the lung apices. Lungs otherwise clear.  Musculoskeletal: Prior median sternotomy. No fracture or worrisome bone lesion.  Review of the MIP images confirms the above findings.  CTA ABDOMEN AND PELVIS FINDINGS  VASCULAR  Aorta: Dissection extends through the length of the abdominal aorta. Mild progression of true lumen compression by the false lumen. Mild diffuse ectasia up to 2.5 cm diameter.  Celiac: Patent, supplied by true lumen.  SMA: Patent, supplied by true lumen.  Renals: The dissection flap involves the origin of the right renal artery, patent distally. The dissection extends through the left main renal artery into its posterior division without definite flow compromise.  IMA: Patent, supplied by true lumen.  Inflow: The false lumen of the dissection can be seen into the proximal right common iliac, and into the distal left common iliac. No evidence of dissection1 involvement of internal or external iliac arteries. Scattered calcified plaque. No aneurysm or stenosis.  Veins: No obvious venous abnormality within the limitations of this arterial phase study.  Review of the MIP images confirms the above findings.  NON-VASCULAR  Hepatobiliary: No focal liver abnormality is seen. No gallstones, gallbladder wall thickening, or biliary dilatation.  Pancreas: Unremarkable. No pancreatic ductal dilatation or surrounding inflammatory changes.  Spleen: Normal in size without focal abnormality.  Adrenals/Urinary Tract: Normal adrenals. Focal area of decreased enhancement in the upper pole left kidney as before. No renal mass or hydronephrosis. Urinary bladder  incompletely distended.  Stomach/Bowel: Stomach is within normal limits. Appendix appears normal. No evidence of bowel wall thickening, distention, or inflammatory changes.  Lymphatic: No abdominal or pelvic adenopathy.  Reproductive: Uterus and bilateral adnexa are unremarkable.  Other: Left pelvic phlebolith. No ascites. No free air.  Musculoskeletal: No acute or significant osseous findings.  Review of the MIP images confirms the above findings.  IMPRESSION: 1. 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 2. Progressive compression of the true lumen of the aorta by the false lumen in the proximal descending and abdominal aorta. 3. Persistent dissection flap in the arch and abdominal aorta into the bilateral subclavian, bilateral axillary, and bilateral common iliac arteries without definite flow compromise. 4. Dissection flap involves the origin of the right renal artery, extends through the length of the left renal artery with small area of upper pole hypoperfusion. 5. Coronary calcifications. The severity of coronary artery disease and any potential stenosis cannot be assessed on this non-gated CT examination. Assessment for potential risk factor modification, dietary therapy or pharmacologic therapy may be warranted, if clinically indicated.   Electronically Signed   By: Corlis Leak  Hassell M.D.   On: 06/15/2018 10:45    Impression:  Patient  is doing well approximately 3 months status post emergency repair of acute type a aortic dissection.  I have personally reviewed the follow-up CT angiogram performed earlier today.  There may be slight interval enlargement of the proximal transverse aortic arch and descending thoracic aorta, but overall the scan looks good with no significant change in comparison with scans performed prior to and following surgical repair.  Plan:  I have  encouraged the patient to continue to increase his physical activity as tolerated but I have reminded him regarding lifelong recommendations regarding physical activity.  Specifically, in the setting of chronic dissection we typically recommend avoiding any type of very heavy lifting or physical straining which might increased intrathoracic pressure or cause sudden spikes in blood pressure.  We discussed the need to follow his blood pressure very carefully and to continue take his blood pressure medications as prescribed.  He will continue to follow-up with Dr. Cristal Deerhristopher and his primary care physician for long-term management of his hypertension and other medical needs.  The patient will return to our office in approximately 3 months for follow-up CT angiogram.    I spent in excess of 15 minutes during the conduct of this office consultation and >50% of this time involved direct face-to-face encounter with the patient for counseling and/or coordination of their care.    Salvatore Decentlarence H. Cornelius Moraswen, MD 06/15/2018 10:57 AM

## 2018-06-15 NOTE — Patient Instructions (Signed)
You may resume unrestricted physical activity without any particular limitations at this time other than the need for life long follow-up and physical restrictions related to the history of aortic dissection including the need for strict blood pressure control, periodic radiographic follow-up examinations, and avoidance of certain types of physical activity such as heavy lifting or other strenuous physical activities which tend to increase pressure within the chest or abdomen.

## 2018-07-02 ENCOUNTER — Ambulatory Visit (INDEPENDENT_AMBULATORY_CARE_PROVIDER_SITE_OTHER): Payer: Self-pay | Admitting: Cardiology

## 2018-07-02 ENCOUNTER — Encounter: Payer: Self-pay | Admitting: Cardiology

## 2018-07-02 VITALS — BP 140/90 | HR 76 | Resp 96 | Ht 73.0 in | Wt 225.8 lb

## 2018-07-02 DIAGNOSIS — I10 Essential (primary) hypertension: Secondary | ICD-10-CM

## 2018-07-02 DIAGNOSIS — Z9889 Other specified postprocedural states: Secondary | ICD-10-CM

## 2018-07-02 DIAGNOSIS — Z7189 Other specified counseling: Secondary | ICD-10-CM

## 2018-07-02 DIAGNOSIS — Z8679 Personal history of other diseases of the circulatory system: Secondary | ICD-10-CM | POA: Insufficient documentation

## 2018-07-02 DIAGNOSIS — Z79899 Other long term (current) drug therapy: Secondary | ICD-10-CM

## 2018-07-02 LAB — LIPID PANEL
CHOL/HDL RATIO: 3.3 ratio (ref 0.0–5.0)
Cholesterol, Total: 168 mg/dL (ref 100–199)
HDL: 51 mg/dL (ref >39)
LDL Calculated: 104 mg/dL — ABNORMAL HIGH (ref 0–99)
Triglycerides: 66 mg/dL (ref 0–149)
VLDL CHOLESTEROL CAL: 13 mg/dL (ref 5–40)

## 2018-07-02 LAB — BASIC METABOLIC PANEL
BUN/Creatinine Ratio: 12 (ref 9–20)
BUN: 13 mg/dL (ref 6–20)
CALCIUM: 9.9 mg/dL (ref 8.7–10.2)
CO2: 21 mmol/L (ref 20–29)
CREATININE: 1.05 mg/dL (ref 0.76–1.27)
Chloride: 98 mmol/L (ref 96–106)
GFR calc Af Amer: 104 mL/min/{1.73_m2} (ref >59)
GFR, EST NON AFRICAN AMERICAN: 90 mL/min/{1.73_m2} (ref >59)
Glucose: 96 mg/dL (ref 65–99)
Potassium: 5.1 mmol/L (ref 3.5–5.2)
Sodium: 136 mmol/L (ref 134–144)

## 2018-07-02 MED ORDER — POTASSIUM CHLORIDE CRYS ER 20 MEQ PO TBCR: 20.0000 meq | EXTENDED_RELEASE_TABLET | Freq: Every day | ORAL | 3 refills | Status: DC | PRN

## 2018-07-02 MED ORDER — ASPIRIN 81 MG PO TBEC: 81.0000 mg | DELAYED_RELEASE_TABLET | Freq: Every day | ORAL | Status: DC

## 2018-07-02 MED ORDER — LISINOPRIL 5 MG PO TABS: 5.0000 mg | ORAL_TABLET | Freq: Two times a day (BID) | ORAL | 3 refills | Status: DC

## 2018-07-02 MED ORDER — FUROSEMIDE 80 MG PO TABS: 80.0000 mg | ORAL_TABLET | Freq: Every day | ORAL | 3 refills | Status: DC | PRN

## 2018-07-02 NOTE — Progress Notes (Signed)
Cardiology Office Note:    Date:  07/02/2018   ID:  Eli Wisse, DOB 09/05/1980, MRN [id number]  PCP:  Mike Gip, FNP  Cardiologist:  Jodelle Red, MD PhD  Referring MD: Lowella Dandy, PA  CC: follow up  History of Present Illness:    Derrick Clayton is a 38 y.o. male with a hx of hypertension, acute ascending aortic dissection s/p repair 02/2018 who is seen in follow up today.   Cardiac history: He presented to the ER 02/2018 with chest pain and hypertensive emergency. He had severe chest and foot pain, and he was found to have an ascending aortic dissection and no doppler flow to the left foot. Dissection also involved right carotid, celiac axis, SMA and bilateral iliac arteries. He was emergently taken for REPAIR OF ASCENDING AORTIC DISECTION USING STRAIGHT HEMASHIELD PLATINUM VASCULAR GRAFT; OPEN HEMIARCH DISTAL ANASTOMOSIS AND RESUSPENSION OF THE NATIVE AORTIC VALVE. His hospitalization was notable for possible atrial tachycardia vs. Flutter, but risk was considered to outweigh benefit of anticoagulation. He presented to the ER on 03/23/18 for peripheral leg edema and has lasix increased for a week.  Today: left lateral thigh has numbness, has been that way since surgery. No weakness in the leg, no issues with instability. Otherwise feels fully recovered, back to work. Ran out of amiodarone, discussed today. Has rare flutter but thinks it might be anxiety. Swelling has been well controlled, hasn't required medication for this in some time. Taking his lisinopril and metoprolol, has noted that his blood pressure has been slightly elevated. Doesn't check BP routinely at home, but when he does it has been consistent with today's reading.  Followed up with Dr. Cornelius Moras on 06-15-18, note reviewed.  Past Medical History:  Diagnosis Date  . Acute thoracic aortic dissection (HCC) 03/10/2018  . Hypertension   . S/P aortic dissection repair 03/11/2018   Straight graft replacement of  ascending thoracic aorta with resuspension of native aortic valve and open hemi-arch distal anastomosis    Past Surgical History:  Procedure Laterality Date  . AORTIC INTERVENTION N/A 03/10/2018   Procedure: RESUSPENSION OF THE NATIVE AORTIC VALVE;  Surgeon: Purcell Nails, MD;  Location: [facility] OR;  Service: Open Heart Surgery;  Laterality: N/A;  . AORTIC VALVE REPAIR N/A 03/10/2018   Procedure: REPAIR OF ASCENDING AORTIC DISECTION USING STRAIGHT HEMASHIELD PLATINUM VASCULAR GRAFT; OPEN HEMIARCH DISTAL ANASTOMOSIS;  Surgeon: Purcell Nails, MD;  Location: [facility] OR;  Service: Open Heart Surgery;  Laterality: N/A;  . FOOT SURGERY      Current Medications: Current Outpatient Medications on File Prior to Visit  Medication Sig  . acetaminophen (TYLENOL) 500 MG tablet Take 500 mg by mouth every 6 (six) hours as needed (pain).  . metoprolol tartrate (LOPRESSOR) 100 MG tablet Take 1 tablet (100 mg total) by mouth 2 (two) times daily.   No current facility-administered medications on file prior to visit.      Allergies:   Patient has no known allergies.   Social History   Socioeconomic History  . Marital status: Single    Spouse name: Not on file  . Number of children: Not on file  . Years of education: Not on file  . Highest education level: Not on file  Occupational History  . Not on file  Social Needs  . Financial resource strain: Not on file  . Food insecurity:    Worry: Not on file    Inability: Not on file  . Transportation needs:  Medical: Not on file    Non-medical: Not on file  Tobacco Use  . Smoking status: Former Smoker    Packs/day: 0.00    Types: Cigarettes  . Smokeless tobacco: Never Used  Substance and Sexual Activity  . Alcohol use: Not Currently    Comment: 3-4 days a week  . Drug use: Yes    Types: Marijuana    Comment: daily  . Sexual activity: Not on file  Lifestyle  . Physical activity:    Days per week: Not on file    Minutes per session: Not  on file  . Stress: Not on file  Relationships  . Social connections:    Talks on phone: Not on file    Gets together: Not on file    Attends religious service: Not on file    Active member of club or organization: Not on file    Attends meetings of clubs or organizations: Not on file    Relationship status: Not on file  Other Topics Concern  . Not on file  Social History Narrative  . Not on file     Family History: The patient's family history includes Cancer in his mother; Heart failure in his father.  ROS:   Please see the history of present illness.  Additional pertinent ROS:  Constitutional: Negative for chills, fever, night sweats, unintentional weight loss  HENT: Negative for ear pain and hearing loss.   Eyes: Negative for loss of vision and eye pain.  Respiratory: Negative for cough, sputum, shortness of breath, wheezing.   Cardiovascular: Negative for chest pain, palpitations, LE edema, PND, orthopnea, and claudication.  Gastrointestinal: Negative for abdominal pain, melena, and hematochezia.  Genitourinary: Negative for dysuria and hematuria.  Musculoskeletal: Negative for falls and myalgias.  Skin: Negative for itching and rash.  Neurological: Negative for focal weakness and loss of consciousness. Continues to have numbness in left lateral thigh. Endo/Heme/Allergies: Does not bruise/bleed easily.    EKGs/Labs/Other Studies Reviewed:    The following studies were reviewed today: Recent hospitalization/surgery reports Intraop TEE Echo Findings   Left Ventricle LV systolic function is low normal with an EF of 50-55%.  Septum No atrial septal defect present. No Patent Foramen Ovale present.  Aortic Valve The valve is trileaflet. Mild to moderate regurgitation.  Aorta Dissection present  Mitral Valve Normal valve structure. Trace regurgitation.  Tricuspid Valve Normal tricuspid valve structure.     EKG:  The ekg ordered previously demonstrates normal sinus  rhythm, borderline QTc  Recent Labs: 03/10/2018: ALT 40 03/17/2018: Magnesium 2.4 03/23/2018: B Natriuretic Peptide 519.7; Hemoglobin 8.4; Platelets 517 07/02/2018: BUN 13; Creatinine, Ser 1.05; Potassium 5.1; Sodium 136  Recent Lipid Panel    Component Value Date/Time   CHOL 168 07/02/2018 1047   TRIG 66 07/02/2018 1047   HDL 51 07/02/2018 1047   CHOLHDL 3.3 07/02/2018 1047   LDLCALC 104 (H) 07/02/2018 1047    Physical Exam:    VS:  BP 140/90   Pulse 76   Resp (!) 96   Ht 6\' 1"  (1.854 m)   Wt 225 lb 12.8 oz (102.4 kg)   BMI 29.79 kg/m     Wt Readings from Last 3 Encounters:  07/02/18 225 lb 12.8 oz (102.4 kg)  06/15/18 220 lb (99.8 kg)  04/17/18 221 lb (100.2 kg)     GEN: Well nourished, well developed in no acute distress HEENT: Normal NECK: No JVD; No carotid bruits LYMPHATICS: No lymphadenopathy CARDIAC: regular rhythm, normal S1 and  S2, no murmurs, rubs, gallops. Radial and DP pulses 2+ bilaterally. RESPIRATORY:  Clear to auscultation without rales, wheezing or rhonchi  ABDOMEN: Soft, non-tender, non-distended MUSCULOSKELETAL:  No LE edema; No deformity  SKIN: Warm and dry NEUROLOGIC:  Alert and oriented x 3 PSYCHIATRIC:  Normal affect   ASSESSMENT:    1. S/P repair ascending aortic dissection   2. Essential hypertension   3. History of aortic dissection   4. Medication management   5. Counseling on health promotion and disease prevention    PLAN:    1. Type A aortic dissection, s/p repair: doing well overall. Edema resolved, back to work. Only residual issue is some numbness of left lateral thigh without weakness.  -had f/u with Dr. Cornelius Moras of CT surgery 06-15-18  -needs tight BP control, see below  -on aspirin 325 mg. Once he is finished with this current bottle, will drop to 81 mg dose.   -updated furosemide and potassium prescriptions to reflect PRN weight based dosing.  2. Atrial tachycardia vs. Flutter:  -Chadsvasc=2. Was not started on  anticoagulation given recent surgery and unclear etiology of the tachycardia. Has finished amiodarone. Rare, brief symptoms, which he attributes to his anxiety. Will stop amiodarone and monitor. If he has recurrent tachycardia or persistent palpitations, would check Zio to determine rhythm.    3. Hypertension: -still elevated today on 5 mg lisinopril and metoprolol. He thinks the easiest thing would be to take both lisinopril and metoprolol twice daily. Therefore, will change to lisinopril 5 mg BID, continue metoprolol 100 mg BID.  -encouraged him to check BP at home, aim for <130/80 and contact me if it remains persistently elevated -check BMET today  4. Prev  -Secondary prevention: -recommend heart healthy/Mediterranean diet, with whole grains, fruits, vegetable, fish, lean meats, nuts, and olive oil. Limit salt. -recommend moderate walking, 3-5 times/week for 30-50 minutes each session. Aim for at least 150 minutes.week. Goal should be pace of 3 miles/hours, or walking 1.5 miles in 30 minutes -recommend avoidance of tobacco products. Avoid excess alcohol. -Additional risk factor control:  -Diabetes: A1c is 5.3  -Lipids: checking today. Update: LDL 104. Will allow him to get back to diet/exercise, discuss statin at next visit.  -Blood pressure control: as above  -Weight: BMI 29  Plan for follow up: 6 mos  TIME SPENT WITH PATIENT: 25 minutes of direct patient care. More than 50% of that time was spent on coordination of care and counseling regarding hypertension goals, medication management.  Jodelle Red, MD, PhD [place]  [facility]   Medication Adjustments/Labs and Tests Ordered: Current medicines are reviewed at length with the patient today.  Concerns regarding medicines are outlined above.  Orders Placed This Encounter  Procedures  . Basic metabolic panel  . Lipid panel   Meds ordered this encounter  Medications  . potassium chloride SA (K-DUR,KLOR-CON) 20  MEQ tablet    Sig: Take 1 tablet (20 mEq total) by mouth daily as needed (only on days when you take the furosemide).    Dispense:  90 tablet    Refill:  3  . furosemide (LASIX) 80 MG tablet    Sig: Take 1 tablet (80 mg total) by mouth daily as needed. If your weight is up 3 lbs overnight or 5 lbs in 3 or more days, take daily until your weight returns to normal.    Dispense:  90 tablet    Refill:  3  . aspirin 81 MG EC tablet  Sig: Take 1 tablet (81 mg total) by mouth daily. When you finish the 325 mg.  . lisinopril (PRINIVIL,ZESTRIL) 5 MG tablet    Sig: Take 1 tablet (5 mg total) by mouth 2 (two) times daily.    Dispense:  90 tablet    Refill:  3    Patient Instructions  Medication Instructions: Your Physician recommend you make the following changes to your medication.  Decrease: Aspirin 81 mg daily Increase: Lisinopril 5 mg two times daily Change: Lasix 80 mg (as needed)                Potassium 20 meq ( as needed)  If you need a refill on your cardiac medications before your next appointment, please call your pharmacy.   Lab work: Your physician recommends that you return for lab work today (BMP, Lipid)  If you have labs (blood work) drawn today and your tests are completely normal, you will receive your results only by: Marland Kitchen MyChart Message (if you have MyChart) OR . A paper copy in the mail If you have any lab test that is abnormal or we need to change your treatment, we will call you to review the results.  Testing/Procedures: None  Follow-Up: At [facility], you and your health needs are our priority.  As part of our continuing mission to provide you with exceptional heart care, we have created designated Provider Care Teams.  These Care Teams include your primary Cardiologist (physician) and Advanced Practice Providers (APPs -  Physician Assistants and Nurse Practitioners) who all work together to provide you with the care you need, when you need it. You will need  a follow up appointment in 6 months.  Please call our office 2 months in advance to schedule this appointment.  You may see Jodelle Red, MD  or one of the following Advanced Practice Providers on your designated Care Team:   Theodore Demark, PA-C . Joni Reining, DNP, ANP       Signed, Jodelle Red, MD PhD 07/02/2018 5:22 PM    [facility]

## 2018-07-02 NOTE — Patient Instructions (Addendum)
Medication Instructions: Your Physician recommend you make the following changes to your medication.  Decrease: Aspirin 81 mg daily Increase: Lisinopril 5 mg two times daily Change: Lasix 80 mg (as needed)                Potassium 20 meq ( as needed)  If you need a refill on your cardiac medications before your next appointment, please call your pharmacy.   Lab work: Your physician recommends that you return for lab work today (BMP, Lipid)  If you have labs (blood work) drawn today and your tests are completely normal, you will receive your results only by: Marland Kitchen MyChart Message (if you have MyChart) OR . A paper copy in the mail If you have any lab test that is abnormal or we need to change your treatment, we will call you to review the results.  Testing/Procedures: None  Follow-Up: At [facility], you and your health needs are our priority.  As part of our continuing mission to provide you with exceptional heart care, we have created designated Provider Care Teams.  These Care Teams include your primary Cardiologist (physician) and Advanced Practice Providers (APPs -  Physician Assistants and Nurse Practitioners) who all work together to provide you with the care you need, when you need it. You will need a follow up appointment in 6 months.  Please call our office 2 months in advance to schedule this appointment.  You may see Jodelle Red, MD  or one of the following Advanced Practice Providers on your designated Care Team:   Theodore Demark, PA-C . Joni Reining, DNP, ANP

## 2018-07-03 ENCOUNTER — Telehealth: Payer: Self-pay | Admitting: Cardiology

## 2018-07-03 NOTE — Telephone Encounter (Signed)
Pt aware of lab results ./cy 

## 2018-07-03 NOTE — Telephone Encounter (Signed)
New Message   Patient returning the nurses phone call from this morning.

## 2018-07-20 ENCOUNTER — Encounter: Payer: Self-pay | Admitting: Family Medicine

## 2018-07-20 ENCOUNTER — Ambulatory Visit (INDEPENDENT_AMBULATORY_CARE_PROVIDER_SITE_OTHER): Payer: Self-pay | Admitting: Family Medicine

## 2018-07-20 VITALS — BP 140/86 | HR 66 | Temp 98.6°F | Resp 16 | Ht 73.0 in | Wt 226.0 lb

## 2018-07-20 DIAGNOSIS — I10 Essential (primary) hypertension: Secondary | ICD-10-CM

## 2018-07-20 DIAGNOSIS — N521 Erectile dysfunction due to diseases classified elsewhere: Secondary | ICD-10-CM

## 2018-07-20 LAB — POCT URINALYSIS DIPSTICK
Bilirubin, UA: NEGATIVE
Glucose, UA: NEGATIVE
Ketones, UA: NEGATIVE
Leukocytes, UA: NEGATIVE
Nitrite, UA: NEGATIVE
Protein, UA: POSITIVE — AB
Spec Grav, UA: 1.025 (ref 1.010–1.025)
Urobilinogen, UA: 0.2 E.U./dL
pH, UA: 6 (ref 5.0–8.0)

## 2018-07-20 NOTE — Patient Instructions (Signed)
Managing Your Hypertension  Hypertension is commonly called high blood pressure. This is when the force of your blood pressing against the walls of your arteries is too strong. Arteries are blood vessels that carry blood from your heart throughout your body. Hypertension forces the heart to work harder to pump blood, and may cause the arteries to become narrow or stiff. Having untreated or uncontrolled hypertension can cause heart attack, stroke, kidney disease, and other problems.  What are blood pressure readings?  A blood pressure reading consists of a higher number over a lower number. Ideally, your blood pressure should be below 120/80. The first ("top") number is called the systolic pressure. It is a measure of the pressure in your arteries as your heart beats. The second ("bottom") number is called the diastolic pressure. It is a measure of the pressure in your arteries as the heart relaxes.  What does my blood pressure reading mean?  Blood pressure is classified into four stages. Based on your blood pressure reading, your health care provider may use the following stages to determine what type of treatment you need, if any. Systolic pressure and diastolic pressure are measured in a unit called mm Hg.  Normal   Systolic pressure: below 120.   Diastolic pressure: below 80.  Elevated   Systolic pressure: 120-129.   Diastolic pressure: below 80.  Hypertension stage 1   Systolic pressure: 130-139.   Diastolic pressure: 80-89.  Hypertension stage 2   Systolic pressure: 140 or above.   Diastolic pressure: 90 or above.  What health risks are associated with hypertension?  Managing your hypertension is an important responsibility. Uncontrolled hypertension can lead to:   A heart attack.   A stroke.   A weakened blood vessel (aneurysm).   Heart failure.   Kidney damage.   Eye damage.   Metabolic syndrome.   Memory and concentration problems.  What changes can I make to manage my  hypertension?  Hypertension can be managed by making lifestyle changes and possibly by taking medicines. Your health care provider will help you make a plan to bring your blood pressure within a normal range.  Eating and drinking     Eat a diet that is high in fiber and potassium, and low in salt (sodium), added sugar, and fat. An example eating plan is called the DASH (Dietary Approaches to Stop Hypertension) diet. To eat this way:  ? Eat plenty of fresh fruits and vegetables. Try to fill half of your plate at each meal with fruits and vegetables.  ? Eat whole grains, such as whole wheat pasta, brown rice, or whole grain bread. Fill about one quarter of your plate with whole grains.  ? Eat low-fat diary products.  ? Avoid fatty cuts of meat, processed or cured meats, and poultry with skin. Fill about one quarter of your plate with lean proteins such as fish, chicken without skin, beans, eggs, and tofu.  ? Avoid premade and processed foods. These tend to be higher in sodium, added sugar, and fat.   Reduce your daily sodium intake. Most people with hypertension should eat less than 1,500 mg of sodium a day.   Limit alcohol intake to no more than 1 drink a day for nonpregnant women and 2 drinks a day for men. One drink equals 12 oz of beer, 5 oz of wine, or 1 oz of hard liquor.  Lifestyle   Work with your health care provider to maintain a healthy body weight, or to lose   weight. Ask what an ideal weight is for you.   Get at least 30 minutes of exercise that causes your heart to beat faster (aerobic exercise) most days of the week. Activities may include walking, swimming, or biking.   Include exercise to strengthen your muscles (resistance exercise), such as weight lifting, as part of your weekly exercise routine. Try to do these types of exercises for 30 minutes at least 3 days a week.   Do not use any products that contain nicotine or tobacco, such as cigarettes and e-cigarettes. If you need help quitting,  ask your health care provider.   Control any long-term (chronic) conditions you have, such as high cholesterol or diabetes.  Monitoring   Monitor your blood pressure at home as told by your health care provider. Your personal target blood pressure may vary depending on your medical conditions, your age, and other factors.   Have your blood pressure checked regularly, as often as told by your health care provider.  Working with your health care provider   Review all the medicines you take with your health care provider because there may be side effects or interactions.   Talk with your health care provider about your diet, exercise habits, and other lifestyle factors that may be contributing to hypertension.   Visit your health care provider regularly. Your health care provider can help you create and adjust your plan for managing hypertension.  Will I need medicine to control my blood pressure?  Your health care provider may prescribe medicine if lifestyle changes are not enough to get your blood pressure under control, and if:   Your systolic blood pressure is 130 or higher.   Your diastolic blood pressure is 80 or higher.  Take medicines only as told by your health care provider. Follow the directions carefully. Blood pressure medicines must be taken as prescribed. The medicine does not work as well when you skip doses. Skipping doses also puts you at risk for problems.  Contact a health care provider if:   You think you are having a reaction to medicines you have taken.   You have repeated (recurrent) headaches.   You feel dizzy.   You have swelling in your ankles.   You have trouble with your vision.  Get help right away if:   You develop a severe headache or confusion.   You have unusual weakness or numbness, or you feel faint.   You have severe pain in your chest or abdomen.   You vomit repeatedly.   You have trouble breathing.  Summary   Hypertension is when the force of blood pumping  through your arteries is too strong. If this condition is not controlled, it may put you at risk for serious complications.   Your personal target blood pressure may vary depending on your medical conditions, your age, and other factors. For most people, a normal blood pressure is less than 120/80.   Hypertension is managed by lifestyle changes, medicines, or both. Lifestyle changes include weight loss, eating a healthy, low-sodium diet, exercising more, and limiting alcohol.  This information is not intended to replace advice given to you by your health care provider. Make sure you discuss any questions you have with your health care provider.  Document Released: 02/05/2012 Document Revised: 04/10/2016 Document Reviewed: 04/10/2016  Elsevier Interactive Patient Education  2019 [employer]

## 2018-07-20 NOTE — Progress Notes (Signed)
[facility]   Progress Note: General Provider: Mike Gip, FNP  SUBJECTIVE:   Derrick Clayton is a 38 y.o. male who  has a past medical history of Acute thoracic aortic dissection (HCC) (03/10/2018), Hypertension, and S/P aortic dissection repair (03/11/2018).. Patient presents today for Hypertension Patient report compliance with his medication on most days. He is followed by Dr. Cristal Deer in cardiology. He is doing well on medications. Complains of difficulty with obtaining an erection. He denies abdominal pain, dysuria or penile discharge. He denies chest pain, SOB, dizziness or leg swelling.       Review of Systems  Constitutional: Negative.   HENT: Negative.   Eyes: Negative.   Respiratory: Negative.   Cardiovascular: Negative.   Gastrointestinal: Negative.   Genitourinary: Negative.   Musculoskeletal: Negative.   Skin: Negative.   Neurological: Negative.   Psychiatric/Behavioral: Negative.      OBJECTIVE: BP 140/86   Pulse 66   Temp 98.6 F (37 C) (Oral)   Resp 16   Ht 6\' 1"  (1.854 m)   Wt 226 lb (102.5 kg)   SpO2 100%   BMI 29.82 kg/m   Wt Readings from Last 3 Encounters:  07/20/18 226 lb (102.5 kg)  07/02/18 225 lb 12.8 oz (102.4 kg)  06/15/18 220 lb (99.8 kg)     Physical Exam Vitals signs and nursing note reviewed.  Constitutional:      General: He is not in acute distress.    Appearance: He is well-developed.  HENT:     Head: Normocephalic and atraumatic.  Eyes:     Conjunctiva/sclera: Conjunctivae normal.     Pupils: Pupils are equal, round, and reactive to light.  Neck:     Musculoskeletal: Normal range of motion.  Cardiovascular:     Rate and Rhythm: Normal rate and regular rhythm.     Heart sounds: Normal heart sounds.  Pulmonary:     Effort: Pulmonary effort is normal. No respiratory distress.     Breath sounds: Normal breath sounds.  Abdominal:     General: Bowel sounds are normal.  There is no distension.     Palpations: Abdomen is soft.  Musculoskeletal: Normal range of motion.  Skin:    General: Skin is warm and dry.  Neurological:     Mental Status: He is alert and oriented to person, place, and time.  Psychiatric:        Behavior: Behavior normal.        Thought Content: Thought content normal.     ASSESSMENT/PLAN:   1. Essential hypertension Patient is being followed by a specialist for this condition. Patient advised to continue with follow up appointments. PCP will continue to monitor progress.   - Urinalysis Dipstick  2. Erectile dysfunction due to diseases classified elsewhere Discussed with Dr. Cristal Deer who states that he is a candiate for viagra or cialis.  - sildenafil (VIAGRA) 50 MG tablet; Take 1 tablet (50 mg total) by mouth daily as needed for erectile dysfunction.  Dispense: 10 tablet; Refill: 0     Return in about 6 months (around 01/18/2019), or if symptoms worsen or fail to improve, for HTN.    The patient was given clear instructions to go to ER or return to medical center if symptoms do not improve, worsen or new problems develop. The patient verbalized understanding and agreed with plan of care.   Ms. Freda Jackson. Riley Lam, FNP-BC [facility] [address]  [place], [place] [address] [phone]

## 2018-07-21 ENCOUNTER — Other Ambulatory Visit: Payer: Self-pay | Admitting: *Deleted

## 2018-07-21 DIAGNOSIS — Z8679 Personal history of other diseases of the circulatory system: Secondary | ICD-10-CM

## 2018-07-21 DIAGNOSIS — Z9889 Other specified postprocedural states: Secondary | ICD-10-CM

## 2018-07-23 MED ORDER — SILDENAFIL CITRATE 50 MG PO TABS: 50.0000 mg | ORAL_TABLET | Freq: Every day | ORAL | 0 refills | Status: DC | PRN

## 2018-09-14 ENCOUNTER — Other Ambulatory Visit: Payer: Self-pay

## 2018-09-14 ENCOUNTER — Encounter: Payer: Self-pay | Admitting: Thoracic Surgery (Cardiothoracic Vascular Surgery)

## 2018-10-10 ENCOUNTER — Other Ambulatory Visit: Payer: Self-pay | Admitting: Physician Assistant

## 2018-10-12 ENCOUNTER — Other Ambulatory Visit: Payer: Self-pay

## 2018-10-12 MED ORDER — METOPROLOL TARTRATE 100 MG PO TABS: 100.0000 mg | ORAL_TABLET | Freq: Two times a day (BID) | ORAL | 3 refills | Status: DC

## 2018-10-26 ENCOUNTER — Ambulatory Visit (INDEPENDENT_AMBULATORY_CARE_PROVIDER_SITE_OTHER): Payer: Self-pay | Admitting: Thoracic Surgery (Cardiothoracic Vascular Surgery)

## 2018-10-26 ENCOUNTER — Ambulatory Visit
Admission: RE | Admit: 2018-10-26 | Discharge: 2018-10-26 | Disposition: A | Discharge status: Home / Self Care | Payer: Self-pay | Source: Ambulatory Visit | Attending: Thoracic Surgery (Cardiothoracic Vascular Surgery) | Admitting: Thoracic Surgery (Cardiothoracic Vascular Surgery)

## 2018-10-26 ENCOUNTER — Encounter: Payer: Self-pay | Admitting: Thoracic Surgery (Cardiothoracic Vascular Surgery)

## 2018-10-26 ENCOUNTER — Other Ambulatory Visit: Payer: Self-pay

## 2018-10-26 VITALS — BP 160/90 | HR 63 | Temp 97.7°F | Resp 20 | Ht 73.0 in | Wt 229.0 lb

## 2018-10-26 DIAGNOSIS — Z9889 Other specified postprocedural states: Secondary | ICD-10-CM

## 2018-10-26 DIAGNOSIS — Z8679 Personal history of other diseases of the circulatory system: Secondary | ICD-10-CM

## 2018-10-26 MED ORDER — IOPAMIDOL (ISOVUE-370) INJECTION 76%
100.0000 mL | Freq: Once | INTRAVENOUS | Status: AC | PRN
  Administered 2018-10-26: 100 mL via INTRAVENOUS

## 2018-10-26 NOTE — Patient Instructions (Signed)
Continue all previous medications without any changes at this time  Check your blood pressure on a regular basis and keep a log for your records.  Discussed with your cardiologist and/or primary care physician whether or not your blood pressure medications should be adjusted.  

## 2018-10-26 NOTE — Progress Notes (Signed)
[address]       [place] [id number]             [phone]     CARDIOTHORACIC SURGERY OFFICE NOTE  Primary Cardiologist is Jodelle RedBridgette Christopher, MD PCP is Mike Gipouglas, Andre, FNP   HPI:  Patient is a 38 year old African-American male with history of hypertension who underwent emergency repair of acute type A aortic dissection with resuspension of the native aortic valve and open hemi-arch distal anastomosis on March 11, 2018.  His postoperative recovery was uneventful and he was last seen here in our office on June 15, 2018.  Follow-up CT angiograms have remained essentially stable although there has been gradual enlargement of the proximal descending thoracic aorta.  Patient returns to our office today for follow-up and repeat CT angiogram.  He reports no new problems or complaints.  He specifically denies any pain in his chest or back.  He has not had any exertional shortness of breath.  The numbness in his left leg has completely resolved.  He is working on a daily basis and spending time with his family.  He denies any fevers or productive cough.  He does not check his blood pressure regularly at home.   Current Outpatient Medications  Medication Sig Dispense Refill  . lisinopril (PRINIVIL,ZESTRIL) 5 MG tablet Take 1 tablet (5 mg total) by mouth 2 (two) times daily. 90 tablet 3  . metoprolol tartrate (LOPRESSOR) 100 MG tablet Take 1 tablet (100 mg total) by mouth 2 (two) times daily. 60 tablet 3  . furosemide (LASIX) 80 MG tablet Take 1 tablet (80 mg total) by mouth daily as needed. If your weight is up 3 lbs overnight or 5 lbs in 3 or more days, take daily until your weight returns to normal. (Patient not taking: Reported on 07/20/2018) 90 tablet 3  . potassium chloride SA (K-DUR,KLOR-CON) 20 MEQ tablet Take 1 tablet (20 mEq total) by mouth daily as needed (only on days when you take the furosemide). (Patient not taking: Reported on 07/20/2018) 90 tablet 3  .  sildenafil (VIAGRA) 50 MG tablet Take 1 tablet (50 mg total) by mouth daily as needed for erectile dysfunction. 10 tablet 0   No current facility-administered medications for this visit.       Physical Exam:   BP (!) 160/90   Pulse 63   Temp 97.7 F (36.5 C) (Skin)   Resp 20   Ht 6\' 1"  (1.854 m)   Wt 229 lb (103.9 kg)   SpO2 98% Comment: RA  BMI 30.21 kg/m   General:  Well-appearing  Chest:   Clear to auscultation  CV:   Regular rate and rhythm without murmur  Incisions:  Completely healed  Abdomen:  Soft nontender  Extremities:  Warm and well-perfused  Diagnostic Tests:  CT ANGIOGRAPHY CHEST, ABDOMEN AND PELVIS  TECHNIQUE: Multidetector CT imaging through the chest, abdomen and pelvis was performed using the standard protocol during bolus administration of intravenous contrast. Multiplanar reconstructed images and MIPs were obtained and reviewed to evaluate the vascular anatomy.  CONTRAST:  100mL ISOVUE-370 IOPAMIDOL (ISOVUE-370) INJECTION 76%  COMPARISON:  CT scan of June 15, 2018.  FINDINGS: CTA CHEST FINDINGS  Cardiovascular: Status post surgical repair of ascending thoracic aorta. Stable dissection that begins in transverse aortic arch and extends into right innominate and left subclavian arteries. Left common artery, which shares common origin with innominate artery, appears intact. The dissection extends through the descending thoracic aorta into  the abdominal aorta. Proximal descending thoracic aorta has maximum measured diameter of 5.1 cm which is enlarged compared to prior exam. Normal cardiac size. No pericardial effusion.  Mediastinum/Nodes: No enlarged mediastinal, hilar, or axillary lymph nodes. Thyroid gland, trachea, and esophagus demonstrate no significant findings.  Lungs/Pleura: Lungs are clear. No pleural effusion or pneumothorax.  Musculoskeletal: No chest wall abnormality. No acute or significant osseous findings.  Review  of the MIP images confirms the above findings.  CTA ABDOMEN AND PELVIS FINDINGS  VASCULAR  Aorta: Thoracic aortic dissection is seen extending into the abdominal aorta to the aortic bifurcation and extending slightly into the origins of both common iliac arteries. This is stable compared to prior exam. No aneurysmal dilatation is noted.  Celiac: Patent without evidence of aneurysm, dissection, vasculitis or significant stenosis. Supplied by true lumen.  SMA: Patent without evidence of aneurysm, dissection, vasculitis or significant stenosis. Supplied by true lumen.  Renals: Dissection flap extends into origin of right renal artery, while extending through a large portion of the proximal left renal artery. This is not significantly changed compared to prior exam.  IMA: Patent without evidence of aneurysm, dissection, vasculitis or significant stenosis. Supplied by true lumen.  Inflow: As noted above, dissection extends slightly into both common iliac arteries, but there is no involvement involving the internal or external iliac arteries. Atherosclerosis of these vessels is noted, but no significant stenosis is noted.  Veins: No obvious venous abnormality within the limitations of this arterial phase study.  Review of the MIP images confirms the above findings.  NON-VASCULAR  Hepatobiliary: No focal liver abnormality is seen. No gallstones, gallbladder wall thickening, or biliary dilatation.  Pancreas: Unremarkable. No pancreatic ductal dilatation or surrounding inflammatory changes.  Spleen: Normal in size without focal abnormality.  Adrenals/Urinary Tract: Adrenal glands are unremarkable. Kidneys are normal, without renal calculi, focal lesion, or hydronephrosis. Bladder is unremarkable.  Stomach/Bowel: Stomach is within normal limits. Appendix appears normal. No evidence of bowel wall thickening, distention, or inflammatory changes.  Lymphatic:  Aortic atherosclerosis. No enlarged abdominal or pelvic lymph nodes.  Reproductive: Prostate is unremarkable.  Other: No abdominal wall hernia or abnormality. No abdominopelvic ascites.  Musculoskeletal: No acute or significant osseous findings.  Review of the MIP images confirms the above findings.  IMPRESSION: Stable thoracic aortic dissection is noted begins in transverse thoracic aortic arch and extends through descending thoracic aorta and into abdominal aorta to the level of the aortic bifurcation, with some involvement of the proximal portions of both common iliac arteries. Dissection flaps are seen extending into the right innominate and left subclavian arteries, as well as into both renal arteries as described above.  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  Aortic Atherosclerosis (ICD10-I70.0).   Electronically Signed   By: Lupita Raider M.D.   On: 10/26/2018 14:53   Impression:  Patient remains clinically stable approximately 8 months status post emergency repair of acute type A aortic dissection.  His blood pressure is slightly elevated in our office today.  The patient does not check his blood pressure regularly at home but he reports that he has been thinking a lot about his mother who passed away last year.  He has been compliant with his medications.  I have personally reviewed the patient's follow-up CT angiogram.  Findings remain stable although there has been interval slight further increase in the transverse diameter of the proximal descending  thoracic aorta.    Plan:  We have not made any recommendations for changes to the patient's current medications.  However, I have suggested that the patient begin checking his blood pressure on a daily basis at home and follow-up in the near future with Dr. Cristal Deer to consider further adjustments  in his blood pressure medications if his blood pressure remains elevated.  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  We will plan another follow-up CT angiogram in 4 months.  All questions answered.  I spent in excess of 15 minutes during the conduct of this office consultation and >50% of this time involved direct face-to-face encounter with the patient for counseling and/or coordination of their care.   Derrick Decent. Cornelius Moras, MD 10/26/2018 3:05 PM

## 2018-10-28 NOTE — Progress Notes (Signed)
Derrick Clayton needs a virtual follow up with me--can we get him in soon for a virtual visit? If he can, it would be helpful for him to measure his blood pressure at home so we have a log to review. Thank you!

## 2018-11-03 ENCOUNTER — Telehealth: Payer: Self-pay

## 2018-11-03 NOTE — Telephone Encounter (Signed)
Spoke with pt and informed him that Dr. Harrell Gave would like to schedule a virtual visit to discuss BP. Pt state he doesn't have insurance and would prefer to not have an appointment but would like MD to recommend med change. Pt will call back with BP readings to forward to MD.

## 2018-11-03 NOTE — Telephone Encounter (Signed)
-----   Message from Buford Dresser, MD sent at 10/28/2018 12:55 PM EDT -----   ----- Message ----- From: Rexene Alberts, MD Sent: 10/26/2018   3:24 PM EDT To: Buford Dresser, MD, Lanae Boast, FNP  We may need to increase his anti-hypertensive medications

## 2018-11-20 NOTE — Telephone Encounter (Signed)
Pt returned called and said his BP was 160/90. Pt report that was taken at Dr. Guy Sandifer office and the only BP he has. Nurse asked pt if he has a BP machine and state he does but haven't opened it yet. Pt encouraged to start taking BP daily and keep a log. Pt voiced understanding.   Will route to MD to make aware.

## 2018-11-20 NOTE — Telephone Encounter (Signed)
Called pt to check on BP as he was suppose to call back with BP readings. Pt state he is at the [place] and will call back in 30 mins.

## 2018-11-23 NOTE — Telephone Encounter (Signed)
Thank you. I agree that he needs to keep a log at home with the BP cuff. He needs to come in to see either me or PharmD for BP management within the next few weeks. Thanks.

## 2018-11-23 NOTE — Telephone Encounter (Signed)
Pt updated and voiced understanding. Appointment scheduled for 7/7 at 11:20 am with Dr. Harrell Gave.

## 2018-11-30 ENCOUNTER — Telehealth: Payer: Self-pay

## 2018-11-30 NOTE — Telephone Encounter (Signed)
   Pt informed of visitor's policy and to wear a mask. Pt voiced understanding.  COVID-19 Pre-Screening Questions:  . In the past 7 to 10 days have you had a cough,  shortness of breath, headache, congestion, fever (100 or greater) body aches, chills, sore throat, or sudden loss of taste or sense of smell? NO . Have you been around anyone with known Covid 19.NO . Have you been around anyone who is awaiting Covid 19 test results in the past 7 to 10 days?NO . Have you been around anyone who has been exposed to Covid 19, or has mentioned symptoms of Covid 19 within the past 7 to 10 days? NO  If you have any concerns/questions about symptoms patients report during screening (either on the phone or at threshold). Contact the provider seeing the patient or DOD for further guidance.  If neither are available contact a member of the leadership team.           

## 2018-12-01 ENCOUNTER — Ambulatory Visit (INDEPENDENT_AMBULATORY_CARE_PROVIDER_SITE_OTHER): Payer: Self-pay | Admitting: Cardiology

## 2018-12-01 ENCOUNTER — Other Ambulatory Visit: Payer: Self-pay

## 2018-12-01 ENCOUNTER — Encounter: Payer: Self-pay | Admitting: Cardiology

## 2018-12-01 VITALS — BP 164/80 | HR 66 | Temp 97.7°F | Ht 73.0 in | Wt 223.6 lb

## 2018-12-01 DIAGNOSIS — I10 Essential (primary) hypertension: Secondary | ICD-10-CM

## 2018-12-01 DIAGNOSIS — Z8679 Personal history of other diseases of the circulatory system: Secondary | ICD-10-CM

## 2018-12-01 DIAGNOSIS — Z79899 Other long term (current) drug therapy: Secondary | ICD-10-CM

## 2018-12-01 DIAGNOSIS — Z9889 Other specified postprocedural states: Secondary | ICD-10-CM

## 2018-12-01 MED ORDER — METOPROLOL SUCCINATE ER 200 MG PO TB24: 200.0000 mg | ORAL_TABLET | Freq: Every day | ORAL | 3 refills | Status: DC

## 2018-12-01 MED ORDER — LISINOPRIL 20 MG PO TABS: 20.0000 mg | ORAL_TABLET | Freq: Every day | ORAL | 3 refills | Status: DC

## 2018-12-01 NOTE — Progress Notes (Signed)
Cardiology Office Note:    Date:  12/01/2018   ID:  Derrick Clayton, DOB Jul 31, 1980, MRN [id number]  PCP:  Mike Gip, FNP  Cardiologist:  Jodelle Red, MD PhD  Referring MD: Lowella Dandy, PA  CC: follow up  History of Present Illness:    Derrick Clayton is a 38 y.o. male with a hx of hypertension, acute ascending aortic dissection s/p repair 02/2018 who is seen in follow up today.   Cardiac history: He presented to the ER 02/2018 with chest pain and hypertensive emergency. He had severe chest and foot pain, and he was found to have an ascending aortic dissection and no doppler flow to the left foot. Dissection also involved right carotid, celiac axis, SMA and bilateral iliac arteries. He was emergently taken for REPAIR OF ASCENDING AORTIC DISECTION USING STRAIGHT HEMASHIELD PLATINUM VASCULAR GRAFT; OPEN HEMIARCH DISTAL ANASTOMOSIS AND RESUSPENSION OF THE NATIVE AORTIC VALVE. His hospitalization was notable for possible atrial tachycardia vs. Flutter, but risk was considered to outweigh benefit of anticoagulation. He presented to the ER on 03/23/18 for peripheral leg edema and has lasix increased for a week.  Today: Taking 5 mg of lisinopril daily, taking metoprolol in AM routinely. Very stressed at home, bad living situation. Living with his dad and other family members. Sometimes doesn't take PM blood pressure meds.   Just started taking home BP recently with home cuff.  214/104 6/29, before he took his medicines, was upset at the time. Came down to 185/105 on recheck 185/105 before medicine 205/103 before medicine 173/106 before medicine 164/89 today, after medicine  Discussed changing to once daily meds.Discussed other strategies that might help with adherence. Stressed the importance of BP management given his history and risk of re-dissection/re-operation.  No chest pain. No fluttering/palpitations. Has occasional arm numbness in bilateral arms, no clear triggers, can last  1 day or more. Infrequent. Tingling from shoulder to fingers. Not positional. No other associated symptoms.   Past Medical History:  Diagnosis Date  . Acute thoracic aortic dissection (HCC) 03/10/2018  . Hypertension   . S/P aortic dissection repair 03/11/2018   Straight graft replacement of ascending thoracic aorta with resuspension of native aortic valve and open hemi-arch distal anastomosis    Past Surgical History:  Procedure Laterality Date  . AORTIC INTERVENTION N/A 03/10/2018   Procedure: RESUSPENSION OF THE NATIVE AORTIC VALVE;  Surgeon: Purcell Nails, MD;  Location: [facility] OR;  Service: Open Heart Surgery;  Laterality: N/A;  . AORTIC VALVE REPAIR N/A 03/10/2018   Procedure: REPAIR OF ASCENDING AORTIC DISECTION USING STRAIGHT HEMASHIELD PLATINUM VASCULAR GRAFT; OPEN HEMIARCH DISTAL ANASTOMOSIS;  Surgeon: Purcell Nails, MD;  Location: [facility] OR;  Service: Open Heart Surgery;  Laterality: N/A;  . FOOT SURGERY      Current Medications: No current outpatient medications on file prior to visit.   No current facility-administered medications on file prior to visit.      Allergies:   Patient has no known allergies.   Social History   Socioeconomic History  . Marital status: Single    Spouse name: Not on file  . Number of children: Not on file  . Years of education: Not on file  . Highest education level: Not on file  Occupational History  . Not on file  Social Needs  . Financial resource strain: Not on file  . Food insecurity    Worry: Not on file    Inability: Not on file  . Transportation needs  Medical: Not on file    Non-medical: Not on file  Tobacco Use  . Smoking status: Former Smoker    Packs/day: 0.00    Types: Cigarettes  . Smokeless tobacco: Never Used  Substance and Sexual Activity  . Alcohol use: Not Currently    Comment: 3-4 days a week  . Drug use: Yes    Types: Marijuana    Comment: daily  . Sexual activity: Not on file  Lifestyle  .  Physical activity    Days per week: Not on file    Minutes per session: Not on file  . Stress: Not on file  Relationships  . Social Musician on phone: Not on file    Gets together: Not on file    Attends religious service: Not on file    Active member of club or organization: Not on file    Attends meetings of clubs or organizations: Not on file    Relationship status: Not on file  Other Topics Concern  . Not on file  Social History Narrative  . Not on file     Family History: The patient's family history includes Cancer in his mother; Heart failure in his father.  ROS:   Please see the history of present illness.  Additional pertinent ROS: Constitutional: Negative for chills, fever, night sweats, unintentional weight loss  HENT: Negative for ear pain and hearing loss.   Eyes: Negative for loss of vision and eye pain.  Respiratory: Negative for cough, sputum, wheezing.   Cardiovascular: See HPI. Gastrointestinal: Negative for abdominal pain, melena, and hematochezia.  Genitourinary: Negative for dysuria and hematuria.  Musculoskeletal: Negative for falls and myalgias.  Skin: Negative for itching and rash.  Neurological: Negative for focal weakness, focal sensory changes and loss of consciousness.  Endo/Heme/Allergies: Does not bruise/bleed easily.    EKGs/Labs/Other Studies Reviewed:    The following studies were reviewed today: Recent hospitalization/surgery reports Intraop TEE Echo Findings   Left Ventricle LV systolic function is low normal with an EF of 50-55%.  Septum No atrial septal defect present. No Patent Foramen Ovale present.  Aortic Valve The valve is trileaflet. Mild to moderate regurgitation.  Aorta Dissection present  Mitral Valve Normal valve structure. Trace regurgitation.  Tricuspid Valve Normal tricuspid valve structure.   EKG:  The ekg ordered 03/24/18 demonstrates normal sinus rhythm, borderline QTc  Recent Labs: 03/10/2018: ALT  40 03/17/2018: Magnesium 2.4 03/23/2018: B Natriuretic Peptide 519.7; Hemoglobin 8.4; Platelets 517 07/02/2018: BUN 13; Creatinine, Ser 1.05; Potassium 5.1; Sodium 136  Recent Lipid Panel    Component Value Date/Time   CHOL 168 07/02/2018 1047   TRIG 66 07/02/2018 1047   HDL 51 07/02/2018 1047   CHOLHDL 3.3 07/02/2018 1047   LDLCALC 104 (H) 07/02/2018 1047    Physical Exam:    VS:  BP (!) 164/80   Pulse 66   Temp 97.7 F (36.5 C)   Ht 6\' 1"  (1.854 m)   Wt 223 lb 9.6 oz (101.4 kg)   BMI 29.50 kg/m     Wt Readings from Last 3 Encounters:  12/01/18 223 lb 9.6 oz (101.4 kg)  10/26/18 229 lb (103.9 kg)  07/20/18 226 lb (102.5 kg)    GEN: Well nourished, well developed in no acute distress HEENT: Normal NECK: No JVD; No carotid bruits LYMPHATICS: No lymphadenopathy CARDIAC: regular rhythm, normal S1 and S2, no murmurs, rubs, gallops. Radial and DP pulses 2+ bilaterally. RESPIRATORY:  Clear to auscultation without rales,  wheezing or rhonchi  ABDOMEN: Soft, non-tender, non-distended MUSCULOSKELETAL:  No edema; No deformity  SKIN: Warm and dry NEUROLOGIC:  Alert and oriented x 3 PSYCHIATRIC:  Normal affect   ASSESSMENT:    1. Medication management   2. Essential hypertension   3. History of aortic dissection   4. S/P repair ascending aortic dissection    PLAN:    Type A aortic dissection, s/p repair: recovered well, but struggling with BP control. Discussed the importance of tight BP control, ideally <120/80. Discussed life threatening risk of elevated BP with his aneurysm/prior dissection -identified that BID meds are tough for him -consolidating beta blocker to metoprolol succinate 200 mg daily.  -will increase to lisinopril 20 mg daily, check BMET today -following with Dr. Cornelius Moras, seen last month -states that he is not smoking -taking aspirin 81 mg -discussed statin for secondary prevention, aiming to optimize BP first  Hypertension: not well controlled. Stressed  importance of this -increasing lisinopril as above -consolidating metoprolol as above -will have him take home BP readings. Instructed on proper way to do this.  -will have him follow up closely with PharmD. Multiple options for additional meds, would aim for once daily meds first such as chlorthalidone, amlodipine, spironolactone to help with adhere. Also have room to uptitrate lisinopril if renal function/potassium stable.  Atrial tachycardia vs. Flutter: seen briefly postop, treated with amio. Denies recurrence.    Secondary prevention: -recommend heart healthy/Mediterranean diet, with whole grains, fruits, vegetable, fish, lean meats, nuts, and olive oil. Limit salt. -recommend moderate walking, 3-5 times/week for 30-50 minutes each session. Aim for at least 150 minutes.week. Goal should be pace of 3 miles/hours, or walking 1.5 miles in 30 minutes -recommend avoidance of tobacco products. Avoid excess alcohol.  Plan for follow up: 3 mos with me, 3-4 weeks with PharmD  Medication Adjustments/Labs and Tests Ordered: Current medicines are reviewed at length with the patient today.  Concerns regarding medicines are outlined above.  Orders Placed This Encounter  Procedures  . Basic metabolic panel   Meds ordered this encounter  Medications  . metoprolol succinate (TOPROL-XL) 200 MG 24 hr tablet    Sig: Take 1 tablet (200 mg total) by mouth daily. Take with or immediately following a meal.    Dispense:  90 tablet    Refill:  3  . lisinopril (ZESTRIL) 20 MG tablet    Sig: Take 1 tablet (20 mg total) by mouth daily.    Dispense:  90 tablet    Refill:  3    Patient Instructions  Medication Instructions:  Stop: Metoprolol Tartrate 100 mg two times daily Start: Metoprolol Succinate 200 mg daily Increase Lisinopril 20 mg daily  If you need a refill on your cardiac medications before your next appointment, please call your pharmacy.   Lab work: Your physician recommends that you  return for lab work today (BMP)   Testing/Procedures: None  Follow-Up: At [facility], you and your health needs are our priority.  As part of our continuing mission to provide you with exceptional heart care, we have created designated Provider Care Teams.  These Care Teams include your primary Cardiologist (physician) and Advanced Practice Providers (APPs -  Physician Assistants and Nurse Practitioners) who all work together to provide you with the care you need, when you need it. You will need a follow up appointment in 3 months.  Please call our office 2 months in advance to schedule this appointment.  You may see Jodelle Red, MD or  one of the following Advanced Practice Providers on your designated Care Team:   Theodore Demark, PA-C . Joni Reining, DNP, ANP  Your physician recommends that you schedule a follow-up appointment in 3-4 weeks with [facility].       Signed, Jodelle Red, MD PhD 12/01/2018 1:17 PM     [facility]

## 2018-12-01 NOTE — Patient Instructions (Signed)
Medication Instructions:  Stop: Metoprolol Tartrate 100 mg two times daily Start: Metoprolol Succinate 200 mg daily Increase Lisinopril 20 mg daily  If you need a refill on your cardiac medications before your next appointment, please call your pharmacy.   Lab work: Your physician recommends that you return for lab work today (BMP)   Testing/Procedures: None  Follow-Up: At [facility], you and your health needs are our priority.  As part of our continuing mission to provide you with exceptional heart care, we have created designated Provider Care Teams.  These Care Teams include your primary Cardiologist (physician) and Advanced Practice Providers (APPs -  Physician Assistants and Nurse Practitioners) who all work together to provide you with the care you need, when you need it. You will need a follow up appointment in 3 months.  Please call our office 2 months in advance to schedule this appointment.  You may see Buford Dresser, MD or one of the following Advanced Practice Providers on your designated Care Team:   Rosaria Ferries, PA-C . Jory Sims, DNP, ANP  Your physician recommends that you schedule a follow-up appointment in 3-4 weeks with Hypertension Clinic.

## 2018-12-02 LAB — BASIC METABOLIC PANEL
BUN/Creatinine Ratio: 15 (ref 9–20)
BUN: 19 mg/dL (ref 6–20)
CO2: 23 mmol/L (ref 20–29)
Calcium: 9.8 mg/dL (ref 8.7–10.2)
Chloride: 102 mmol/L (ref 96–106)
Creatinine, Ser: 1.29 mg/dL — ABNORMAL HIGH (ref 0.76–1.27)
GFR calc Af Amer: 80 mL/min/{1.73_m2} (ref >59)
GFR calc non Af Amer: 69 mL/min/{1.73_m2} (ref >59)
Glucose: 89 mg/dL (ref 65–99)
Potassium: 5 mmol/L (ref 3.5–5.2)
Sodium: 139 mmol/L (ref 134–144)

## 2018-12-24 ENCOUNTER — Ambulatory Visit: Payer: Self-pay

## 2018-12-24 NOTE — Progress Notes (Deleted)
     12/24/2018 Merrily Pew November 21, 1980 [id number]   HPI:  Derrick Clayton is a 38 y.o. male patient of Dr Harrell Gave, with a [place] below who presents today for hypertension clinic evaluation.  In addition to hypertension his medical history is significant for an ascending thoracic aortic dissection (extended up brachiocephalic artery into fight common carotid, as well as right common iliac and   Blood Pressure Goal:  120/80  Current Medications:  Family Hx:  Social Hx:  Diet:  Exercise:  Home BP readings:  Intolerances:   Labs:  Wt Readings from Last 3 Encounters:  12/01/18 223 lb 9.6 oz (101.4 kg)  10/26/18 229 lb (103.9 kg)  07/20/18 226 lb (102.5 kg)   BP Readings from Last 3 Encounters:  12/01/18 (!) 164/80  10/26/18 (!) 160/90  07/20/18 140/86   Pulse Readings from Last 3 Encounters:  12/01/18 66  10/26/18 63  07/20/18 66    Current Outpatient Medications  Medication Sig Dispense Refill  . aspirin EC 81 MG tablet Take 81 mg by mouth daily.    Marland Kitchen lisinopril (ZESTRIL) 20 MG tablet Take 1 tablet (20 mg total) by mouth daily. 90 tablet 3  . metoprolol succinate (TOPROL-XL) 200 MG 24 hr tablet Take 1 tablet (200 mg total) by mouth daily. Take with or immediately following a meal. 90 tablet 3   No current facility-administered medications for this visit.     No Known Allergies  Past Medical History:  Diagnosis Date  . Acute thoracic aortic dissection ([facility]) 03/10/2018  . Hypertension   . S/P aortic dissection repair 03/11/2018   Straight graft replacement of ascending thoracic aorta with resuspension of native aortic valve and open hemi-arch distal anastomosis    There were no vitals taken for this visit.  No problem-specific Assessment & Plan notes found for this encounter.   Tommy Medal PharmD CPP [facility] [address] [place],  [address] [phone]

## 2019-01-18 ENCOUNTER — Encounter: Payer: Self-pay | Admitting: Family Medicine

## 2019-01-18 ENCOUNTER — Other Ambulatory Visit: Payer: Self-pay

## 2019-01-18 ENCOUNTER — Ambulatory Visit (INDEPENDENT_AMBULATORY_CARE_PROVIDER_SITE_OTHER): Payer: Self-pay | Admitting: Family Medicine

## 2019-01-18 VITALS — BP 150/82 | HR 86 | Temp 99.0°F | Resp 16 | Ht 73.0 in | Wt 221.0 lb

## 2019-01-18 DIAGNOSIS — I10 Essential (primary) hypertension: Secondary | ICD-10-CM

## 2019-01-18 DIAGNOSIS — Z131 Encounter for screening for diabetes mellitus: Secondary | ICD-10-CM

## 2019-01-18 DIAGNOSIS — R1013 Epigastric pain: Secondary | ICD-10-CM

## 2019-01-18 LAB — POCT URINALYSIS DIPSTICK
Glucose, UA: NEGATIVE
Leukocytes, UA: NEGATIVE
Nitrite, UA: NEGATIVE
Protein, UA: POSITIVE — AB
Spec Grav, UA: >=1.03 — AB (ref 1.010–1.025)
Urobilinogen, UA: 0.2 E.U./dL
pH, UA: 6 (ref 5.0–8.0)

## 2019-01-18 MED ORDER — OMEPRAZOLE 40 MG PO CPDR: 40.0000 mg | DELAYED_RELEASE_CAPSULE | Freq: Every day | ORAL | 3 refills | Status: DC

## 2019-01-18 NOTE — Progress Notes (Signed)
  Patient [facility]   Progress Note: General Provider: Lanae Boast, FNP  SUBJECTIVE:   Derrick Clayton is a 38 y.o. male who  has a past medical history of Acute thoracic aortic dissection ([facility]) (03/10/2018), Hypertension, and S/P aortic dissection repair (03/11/2018).. Patient presents today for Hypertension and heart burn. Patient states that he is compliant with his medications. He reports having daily episodes of acid reflux. He is not currently taking anything for this. He also states that he has had a recent increase in thirst and would like to be screened for diabetes.      Review of Systems  Constitutional: Negative.   HENT: Negative.   Eyes: Negative.   Respiratory: Negative.   Cardiovascular: Negative.   Gastrointestinal: Positive for heartburn.  Genitourinary: Negative.   Musculoskeletal: Negative.   Skin: Negative.   Neurological: Negative.   Endo/Heme/Allergies: Positive for polydipsia.  Psychiatric/Behavioral: Negative.      OBJECTIVE: BP (!) 150/82 (BP Location: Left Arm, Patient Position: Sitting, Cuff Size: Large)   Pulse 86   Temp 99 F (37.2 C) (Oral)   Resp 16   Ht 6\' 1"  (1.854 m)   Wt 221 lb (100.2 kg)   SpO2 99%   BMI 29.16 kg/m   Wt Readings from Last 3 Encounters:  01/18/19 221 lb (100.2 kg)  12/01/18 223 lb 9.6 oz (101.4 kg)  10/26/18 229 lb (103.9 kg)     Physical Exam Vitals signs and nursing note reviewed.  Constitutional:      General: He is not in acute distress.    Appearance: Normal appearance.  HENT:     Head: Normocephalic and atraumatic.  Eyes:     Extraocular Movements: Extraocular movements intact.     Conjunctiva/sclera: Conjunctivae normal.     Pupils: Pupils are equal, round, and reactive to light.  Cardiovascular:     Rate and Rhythm: Normal rate and regular rhythm.     Heart sounds: No murmur.  Pulmonary:     Effort: Pulmonary effort is normal.     Breath sounds: Normal  breath sounds.  Abdominal:     Tenderness: There is abdominal tenderness (epigastric).  Musculoskeletal: Normal range of motion.  Skin:    General: Skin is warm and dry.  Neurological:     Mental Status: He is alert and oriented to person, place, and time.  Psychiatric:        Mood and Affect: Mood normal.        Behavior: Behavior normal.        Thought Content: Thought content normal.        Judgment: Judgment normal.     ASSESSMENT/PLAN: 1. Essential hypertension - Urinalysis Dipstick  2. Epigastric pain - H. pylori breath test  3. Screening for diabetes mellitus No diabetes detected today.  - HgB A1c   Return in about 6 months (around 07/21/2019) for htn.    The patient was given clear instructions to go to ER or return to medical center if symptoms do not improve, worsen or new problems develop. The patient verbalized understanding and agreed with plan of care.   Ms. Doug Sou. Nathaneil Canary, FNP-BC [facility] [address] [place], [place] [address] [phone]

## 2019-01-18 NOTE — Patient Instructions (Addendum)
Please take your medications every day as they were prescribed by your Cardiologist.    Health Maintenance, Male Adopting a healthy lifestyle and getting preventive care are important in promoting health and wellness. Ask your health care provider about:  The right schedule for you to have regular tests and exams.  Things you can do on your own to prevent diseases and keep yourself healthy. What should I know about diet, weight, and exercise? Eat a healthy diet   Eat a diet that includes plenty of vegetables, fruits, low-fat dairy products, and lean protein.  Do not eat a lot of foods that are high in solid fats, added sugars, or sodium. Maintain a healthy weight Body mass index (BMI) is a measurement that can be used to identify possible weight problems. It estimates body fat based on height and weight. Your health care provider can help determine your BMI and help you achieve or maintain a healthy weight. Get regular exercise Get regular exercise. This is one of the most important things you can do for your health. Most adults should:  Exercise for at least 150 minutes each week. The exercise should increase your heart rate and make you sweat (moderate-intensity exercise).  Do strengthening exercises at least twice a week. This is in addition to the moderate-intensity exercise.  Spend less time sitting. Even light physical activity can be beneficial. Watch cholesterol and blood lipids Have your blood tested for lipids and cholesterol at 39 years of age, then have this test every 5 years. You may need to have your cholesterol levels checked more often if:  Your lipid or cholesterol levels are high.  You are older than 39 years of age.  You are at high risk for heart disease. What should I know about cancer screening? Many types of cancers can be detected early and may often be prevented. Depending on your health history and family history, you may need to have cancer screening at  various ages. This may include screening for:  Colorectal cancer.  Prostate cancer.  Skin cancer.  Lung cancer. What should I know about heart disease, diabetes, and high blood pressure? Blood pressure and heart disease  High blood pressure causes heart disease and increases the risk of stroke. This is more likely to develop in people who have high blood pressure readings, are of African descent, or are overweight.  Talk with your health care provider about your target blood pressure readings.  Have your blood pressure checked: ? Every 3-5 years if you are 71-80 years of age. ? Every year if you are 62 years old or older.  If you are between the ages of 37 and 22 and are a current or former smoker, ask your health care provider if you should have a one-time screening for abdominal aortic aneurysm (AAA). Diabetes Have regular diabetes screenings. This checks your fasting blood sugar level. Have the screening done:  Once every three years after age 52 if you are at a normal weight and have a low risk for diabetes.  More often and at a younger age if you are overweight or have a high risk for diabetes. What should I know about preventing infection? Hepatitis B If you have a higher risk for hepatitis B, you should be screened for this virus. Talk with your health care provider to find out if you are at risk for hepatitis B infection. Hepatitis C Blood testing is recommended for:  Everyone born from [birth year] through 1966.  Anyone with known  risk factors for hepatitis C. Sexually transmitted infections (STIs)  You should be screened each year for STIs, including gonorrhea and chlamydia, if: ? You are sexually active and are younger than 39 years of age. ? You are older than 39 years of age and your health care provider tells you that you are at risk for this type of infection. ? Your sexual activity has changed since you were last screened, and you are at increased risk for chlamydia  or gonorrhea. Ask your health care provider if you are at risk.  Ask your health care provider about whether you are at high risk for HIV. Your health care provider may recommend a prescription medicine to help prevent HIV infection. If you choose to take medicine to prevent HIV, you should first get tested for HIV. You should then be tested every 3 months for as long as you are taking the medicine. Follow these instructions at home: Lifestyle  Do not use any products that contain nicotine or tobacco, such as cigarettes, e-cigarettes, and chewing tobacco. If you need help quitting, ask your health care provider.  Do not use street drugs.  Do not share needles.  Ask your health care provider for help if you need support or information about quitting drugs. Alcohol use  Do not drink alcohol if your health care provider tells you not to drink.  If you drink alcohol: ? Limit how much you have to 0-2 drinks a day. ? Be aware of how much alcohol is in your drink. In the [place] one drink equals one 12 oz bottle of beer (355 mL), one 5 oz glass of wine (148 mL), or one 1 oz glass of hard liquor (44 mL). General instructions  Schedule regular health, dental, and eye exams.  Stay current with your vaccines.  Tell your health care provider if: ? You often feel depressed. ? You have ever been abused or do not feel safe at home. Summary  Adopting a healthy lifestyle and getting preventive care are important in promoting health and wellness.  Follow your health care provider's instructions about healthy diet, exercising, and getting tested or screened for diseases.  Follow your health care provider's instructions on monitoring your cholesterol and blood pressure. This information is not intended to replace advice given to you by your health care provider. Make sure you discuss any questions you have with your health care provider. Document Released: 11/09/2007 Document Revised: 05/06/2018  Document Reviewed: 05/06/2018 Elsevier Patient Education  2020 [employer].

## 2019-01-20 LAB — H. PYLORI BREATH TEST: H pylori Breath Test: POSITIVE — AB

## 2019-01-21 ENCOUNTER — Other Ambulatory Visit: Payer: Self-pay | Admitting: Family Medicine

## 2019-01-21 DIAGNOSIS — A048 Other specified bacterial intestinal infections: Secondary | ICD-10-CM

## 2019-01-21 MED ORDER — AMOXICILLIN 500 MG PO CAPS: 1000.0000 mg | ORAL_CAPSULE | Freq: Two times a day (BID) | ORAL | 0 refills | Status: AC

## 2019-01-21 MED ORDER — OMEPRAZOLE 40 MG PO CPDR: 40.0000 mg | DELAYED_RELEASE_CAPSULE | Freq: Two times a day (BID) | ORAL | 0 refills | Status: DC

## 2019-01-21 MED ORDER — CLARITHROMYCIN 500 MG PO TABS: 500.0000 mg | ORAL_TABLET | Freq: Two times a day (BID) | ORAL | 0 refills | Status: AC

## 2019-01-21 NOTE — Progress Notes (Signed)
Please let patient know that he was positive for H. Pylori. This is a bacteria that can cause stomach pain and bloating. The treatment involves taking several antibiotics for 2 weeks. I will send in the medications to the pharmacy with instructions. If he has the omeprazole that I prescribed at the last visit, he can take those BID for the 14 days instead of buying more.

## 2019-01-22 ENCOUNTER — Telehealth: Payer: Self-pay

## 2019-01-22 NOTE — Telephone Encounter (Signed)
Called and spoke with patient, advised that he was positive for H. Pylori and that this bacteria can cause stomach pain and bloating. Advised that he has been sent several antibiotic to take bid for 2 weeks. Advised that it is important that he finish the medications. Patient verbalized understanding. Thanks !

## 2019-01-22 NOTE — Telephone Encounter (Signed)
-----   Message from Lanae Boast, [place] sent at 01/21/2019  6:01 PM EDT ----- Please let patient know that he was positive for H. Pylori. This is a bacteria that can cause stomach pain and bloating. The treatment involves taking several antibiotics for 2 weeks. I will send in the medications to the pharmacy with instructions. If he has the omeprazole that I prescribed at the last visit, he can take those BID for the 14 days instead of buying more.

## 2019-01-25 ENCOUNTER — Other Ambulatory Visit: Payer: Self-pay | Admitting: Thoracic Surgery (Cardiothoracic Vascular Surgery)

## 2019-01-25 DIAGNOSIS — Z8679 Personal history of other diseases of the circulatory system: Secondary | ICD-10-CM

## 2019-01-29 ENCOUNTER — Encounter: Payer: Self-pay | Admitting: Family Medicine

## 2019-02-03 ENCOUNTER — Encounter (HOSPITAL_COMMUNITY): Payer: Self-pay

## 2019-02-03 ENCOUNTER — Encounter (HOSPITAL_COMMUNITY): Payer: Self-pay | Admitting: *Deleted

## 2019-03-03 ENCOUNTER — Ambulatory Visit (HOSPITAL_COMMUNITY)
Admission: EM | Admit: 2019-03-03 | Discharge: 2019-03-03 | Disposition: A | Discharge status: Home / Self Care | Payer: Self-pay | Attending: Emergency Medicine | Admitting: Emergency Medicine

## 2019-03-03 ENCOUNTER — Encounter (HOSPITAL_COMMUNITY): Payer: Self-pay

## 2019-03-03 ENCOUNTER — Ambulatory Visit (INDEPENDENT_AMBULATORY_CARE_PROVIDER_SITE_OTHER): Payer: Self-pay | Admitting: Cardiology

## 2019-03-03 ENCOUNTER — Encounter: Payer: Self-pay | Admitting: Cardiology

## 2019-03-03 ENCOUNTER — Other Ambulatory Visit: Payer: Self-pay

## 2019-03-03 VITALS — BP 145/84 | HR 64 | Ht 73.0 in | Wt 226.1 lb

## 2019-03-03 DIAGNOSIS — Z79899 Other long term (current) drug therapy: Secondary | ICD-10-CM

## 2019-03-03 DIAGNOSIS — Z7189 Other specified counseling: Secondary | ICD-10-CM

## 2019-03-03 DIAGNOSIS — Z8679 Personal history of other diseases of the circulatory system: Secondary | ICD-10-CM

## 2019-03-03 DIAGNOSIS — R300 Dysuria: Secondary | ICD-10-CM

## 2019-03-03 DIAGNOSIS — R369 Urethral discharge, unspecified: Secondary | ICD-10-CM | POA: Insufficient documentation

## 2019-03-03 DIAGNOSIS — Z9889 Other specified postprocedural states: Secondary | ICD-10-CM

## 2019-03-03 DIAGNOSIS — Z202 Contact with and (suspected) exposure to infections with a predominantly sexual mode of transmission: Secondary | ICD-10-CM | POA: Insufficient documentation

## 2019-03-03 DIAGNOSIS — I10 Essential (primary) hypertension: Secondary | ICD-10-CM

## 2019-03-03 MED ORDER — AZITHROMYCIN 250 MG PO TABS
ORAL_TABLET | ORAL | Status: AC
  Filled 2019-03-03: qty 4

## 2019-03-03 MED ORDER — AMLODIPINE BESYLATE 5 MG PO TABS: 5.0000 mg | ORAL_TABLET | Freq: Every day | ORAL | 3 refills | Status: DC

## 2019-03-03 MED ORDER — CEFTRIAXONE SODIUM 250 MG IJ SOLR
INTRAMUSCULAR | Status: AC
  Filled 2019-03-03: qty 250

## 2019-03-03 MED ORDER — AZITHROMYCIN 250 MG PO TABS
1000.0000 mg | ORAL_TABLET | Freq: Once | ORAL | Status: AC
  Administered 2019-03-03: 14:00:00 1000 mg via ORAL

## 2019-03-03 MED ORDER — CEFTRIAXONE SODIUM 250 MG IJ SOLR
250.0000 mg | Freq: Once | INTRAMUSCULAR | Status: AC
  Administered 2019-03-03: 14:00:00 250 mg via INTRAMUSCULAR

## 2019-03-03 NOTE — ED Triage Notes (Signed)
Pt presents to UC w/ c/o penile discharge x4 days. Pt reported some burning on urination 4 days ago but none since then. Pt would like STD testing.

## 2019-03-03 NOTE — Discharge Instructions (Addendum)
You were treated with two antibiotics today, Rocephin and Zithromax.  ° °Do not have sex for 7 days. ° °Your STD tests are pending.  If your test results are positive, we will call you.  You may need additional treatment and your partner may also need treatment.     ° °

## 2019-03-03 NOTE — ED Provider Notes (Signed)
[facility]    CSN: [id number] Arrival date & time: 03/03/19  1312      History   Chief Complaint No chief complaint on file.   HPI Derrick Clayton is a 38 y.o. male.   Patient has a 4-day history of clear penile discharge and dysuria.  He denies fever, chills, abdominal pain, testicular pain, back pain, lesions, rash, or other symptoms.  He is sexually active and does not consistently use condoms.  The history is provided by the patient.    Past Medical History:  Diagnosis Date  . Acute thoracic aortic dissection ([facility]) 03/10/2018  . Hypertension   . S/P aortic dissection repair 03/11/2018   Straight graft replacement of ascending thoracic aorta with resuspension of native aortic valve and open hemi-arch distal anastomosis    Patient Active Problem List   Diagnosis Date Noted  . History of aortic dissection 07/02/2018  . History of paroxysmal atrial tachycardia 07/02/2018  . S/P repair ascending aortic dissection 03/11/2018  . Uncontrolled hypertension 03/10/2018  . TOBACCO DEPENDENCE 07/24/2006  . Essential hypertension 07/24/2006    Past Surgical History:  Procedure Laterality Date  . AORTIC INTERVENTION N/A 03/10/2018   Procedure: RESUSPENSION OF THE NATIVE AORTIC VALVE;  Surgeon: Purcell Nails, MD;  Location: [facility] OR;  Service: Open Heart Surgery;  Laterality: N/A;  . AORTIC VALVE REPAIR N/A 03/10/2018   Procedure: REPAIR OF ASCENDING AORTIC DISECTION USING STRAIGHT HEMASHIELD PLATINUM VASCULAR GRAFT; OPEN HEMIARCH DISTAL ANASTOMOSIS;  Surgeon: Purcell Nails, MD;  [facility]: [facility] OR;  Service: Open Heart Surgery;  Laterality: N/A;  . FOOT SURGERY         Home Medications    Prior to Admission medications   Medication Sig Start Date End Date Taking? Authorizing Provider  amLODipine (NORVASC) 5 MG tablet Take 1 tablet (5 mg total) by mouth daily. 03/03/19   Jodelle Red, MD  aspirin EC 81 MG tablet Take 81 mg by mouth daily.    [provider]  lisinopril (ZESTRIL) 20 MG tablet Take 1 tablet (20 mg total) by mouth daily. 12/01/18 11/26/19  Jodelle Red, MD  metoprolol succinate (TOPROL-XL) 200 MG 24 hr tablet Take 1 tablet (200 mg total) by mouth daily. Take with or immediately following a meal. 12/01/18 11/26/19  Jodelle Red, MD    Family History Family History  Problem Relation Age of Onset  . Cancer Mother   . Heart failure Father     Social History Social History   Tobacco Use  . Smoking status: Former Smoker    Packs/day: 0.00    Types: Cigarettes  . Smokeless tobacco: Never Used  Substance Use Topics  . Alcohol use: Not Currently    Comment: 3-4 days a week  . Drug use: Yes    Types: Marijuana    Comment: daily     Allergies   Patient has no known allergies.   Review of Systems Review of Systems  Constitutional: Negative for chills and fever.  HENT: Negative for ear pain and sore throat.   Eyes: Negative for pain and visual disturbance.  Respiratory: Negative for cough and shortness of breath.   Cardiovascular: Negative for chest pain and palpitations.  Gastrointestinal: Negative for abdominal pain and vomiting.  Genitourinary: Positive for discharge and dysuria. Negative for flank pain, hematuria and testicular pain.  Musculoskeletal: Negative for arthralgias and back pain.  Skin: Negative for color change and rash.  Neurological: Negative for seizures and syncope.  All other systems  reviewed and are negative.    Physical Exam Triage Vital Signs ED Triage Vitals  Enc Vitals Group     BP 03/03/19 1358 (!) 157/82     Pulse Rate 03/03/19 1358 78     Resp 03/03/19 1358 16     Temp 03/03/19 1358 98.4 F (36.9 C)     Temp Source 03/03/19 1358 Oral     SpO2 03/03/19 1358 99 %     Weight --      Height --      Head Circumference --      Peak Flow --      Pain Score 03/03/19 1400 8     Pain Loc --      Pain Edu? --      Excl. in GC? --    No data found.   Updated Vital Signs BP (!) 157/82 (BP Location: Right Arm)   Pulse 78   Temp 98.4 F (36.9 C) (Oral)   Resp 16   SpO2 99%   Visual Acuity Right Eye Distance:   Left Eye Distance:   Bilateral Distance:    Right Eye Near:   Left Eye Near:    Bilateral Near:     Physical Exam Vitals signs and nursing note reviewed.  Constitutional:      Appearance: He is well-developed.  HENT:     Head: Normocephalic and atraumatic.  Eyes:     Conjunctiva/sclera: Conjunctivae normal.  Neck:     Musculoskeletal: Neck supple.  Cardiovascular:     Rate and Rhythm: Normal rate and regular rhythm.     Heart sounds: No murmur.  Pulmonary:     Effort: Pulmonary effort is normal. No respiratory distress.     Breath sounds: Normal breath sounds.  Abdominal:     General: Bowel sounds are normal.     Palpations: Abdomen is soft.     Tenderness: There is no abdominal tenderness. There is no right CVA tenderness, left CVA tenderness, guarding or rebound.  Genitourinary:    Penis: Normal.      Scrotum/Testes: Normal.  Skin:    General: Skin is warm and dry.  Neurological:     Mental Status: He is alert.      UC Treatments / Results  Labs (all labs ordered are listed, but only abnormal results are displayed) Labs Reviewed  CYTOLOGY, (ORAL, ANAL, URETHRAL) ANCILLARY ONLY    EKG   Radiology No results found.  Procedures Procedures (including critical care time)  Medications Ordered in UC Medications  cefTRIAXone (ROCEPHIN) injection 250 mg (has no administration in time range)  azithromycin (ZITHROMAX) tablet 1,000 mg (has no administration in time range)    Initial Impression / Assessment and Plan / UC Course  I have reviewed the triage vital signs and the nursing notes.  Pertinent labs & imaging results that were available during my care of the patient were reviewed by me and considered in my medical decision making (see chart for details).    Penile discharge, possible  exposure to STD.  Urethral swab obtained.  Treated with Rocephin and Zithromax.  Instructed patient to refrain from sex until his STD tests are back.  Discussed that we will call him if his test results are positive and that he and his partner may need treatment at that time.  Patient agrees to plan of care.   Final Clinical Impressions(s) / UC Diagnoses   Final diagnoses:  Penile discharge  Possible exposure to STD  Discharge Instructions     You were treated with two antibiotics today, Rocephin and Zithromax.    Do not have sex for 7 days.  Your STD tests are pending.  If your test results are positive, we will call you.  You may need additional treatment and your partner may also need treatment.          ED Prescriptions    None     PDMP not reviewed this encounter.   Sharion Balloon, NP 03/03/19 1415

## 2019-03-03 NOTE — Progress Notes (Signed)
Cardiology Office Note:    Date:  03/03/2019   ID:  Derrick Clayton, DOB [birth year], MRN [id number]  PCP:  Mike Gip, FNP  Cardiologist:  Jodelle Red, MD PhD  Referring MD: Lowella Dandy, PA  CC: follow up  History of Present Illness:    Derrick Clayton is a 38 y.o. male with a hx of hypertension, acute ascending aortic dissection s/p repair 02/2018 who is seen in follow up today.   Cardiac history: He presented to the ER 02/2018 with chest pain and hypertensive emergency. He had severe chest and foot pain, and he was found to have an ascending aortic dissection and no doppler flow to the left foot. Dissection also involved right carotid, celiac axis, SMA and bilateral iliac arteries. He was emergently taken for REPAIR OF ASCENDING AORTIC DISECTION USING STRAIGHT HEMASHIELD PLATINUM VASCULAR GRAFT; OPEN HEMIARCH DISTAL ANASTOMOSIS AND RESUSPENSION OF THE NATIVE AORTIC VALVE. His hospitalization was notable for possible atrial tachycardia vs. Flutter, but risk was considered to outweigh benefit of anticoagulation. He presented to the ER on 03/23/18 for peripheral leg edema and has lasix increased for a week.  Today: Did not follow up with PharmD after last visit, had appt 12/24/18 and no-showed.  Not checking BP at home recently. Discussed need to repeat BMET as his last showed that his kidney function was slightly worse. Also dicussed that his BP is not at goal, need to add another medication. He is frustrated by this, wants to know why him. We discussed this. Wants to know what else he can do besides medication, reviewed this at length. He doesn't like taking pills, doesn't want to be on medication for the rest of his life, but wants to be around for his kids.  We discussed that these two things don't agree. If he wants to be around for his kids, he needs to control his blood pressure to avoid another complication, such as another dissection, kidney failure, stroke, or heart attack. I  reviewed with him the natural history of hypertension, especially that it actually worsens typically with age and does not improve. He is maxed on metoprolol, which is crucial given his aortic disease history. He is also on lisinopril, but with worse kidney function need to be cautious on this. I recommended amlodipine, and we discussed how it works and potential side effects.  Denies chest pain, shortness of breath at rest or with normal exertion. No PND, orthopnea, LE edema or unexpected weight gain. No syncope or palpitations.  Past Medical History:  Diagnosis Date  . Acute thoracic aortic dissection (HCC) 03/10/2018  . Hypertension   . S/P aortic dissection repair 03/11/2018   Straight graft replacement of ascending thoracic aorta with resuspension of native aortic valve and open hemi-arch distal anastomosis    Past Surgical History:  Procedure Laterality Date  . AORTIC INTERVENTION N/A 03/10/2018   Procedure: RESUSPENSION OF THE NATIVE AORTIC VALVE;  Surgeon: Purcell Nails, MD;  Location: [facility] OR;  Service: Open Heart Surgery;  Laterality: N/A;  . AORTIC VALVE REPAIR N/A 03/10/2018   Procedure: REPAIR OF ASCENDING AORTIC DISECTION USING STRAIGHT HEMASHIELD PLATINUM VASCULAR GRAFT; OPEN HEMIARCH DISTAL ANASTOMOSIS;  Surgeon: Purcell Nails, MD;  Location: [facility] OR;  Service: Open Heart Surgery;  Laterality: N/A;  . FOOT SURGERY      Current Medications: Current Outpatient Medications on File Prior to Visit  Medication Sig  . aspirin EC 81 MG tablet Take 81 mg by mouth daily.  Marland Kitchen lisinopril (ZESTRIL)  20 MG tablet Take 1 tablet (20 mg total) by mouth daily.  . metoprolol succinate (TOPROL-XL) 200 MG 24 hr tablet Take 1 tablet (200 mg total) by mouth daily. Take with or immediately following a meal.   No current facility-administered medications on file prior to visit.      Allergies:   Patient has no known allergies.   Social History   Socioeconomic History  . Marital  status: Single    Spouse name: Not on file  . Number of children: Not on file  . Years of education: Not on file  . Highest education level: Not on file  Occupational History  . Not on file  Social Needs  . Financial resource strain: Not on file  . Food insecurity    Worry: Not on file    Inability: Not on file  . Transportation needs    Medical: Not on file    Non-medical: Not on file  Tobacco Use  . Smoking status: Former Smoker    Packs/day: 0.00    Types: Cigarettes  . Smokeless tobacco: Never Used  Substance and Sexual Activity  . Alcohol use: Not Currently    Comment: 3-4 days a week  . Drug use: Yes    Types: Marijuana    Comment: daily  . Sexual activity: Not on file  Lifestyle  . Physical activity    Days per week: Not on file    Minutes per session: Not on file  . Stress: Not on file  Relationships  . Social Musician on phone: Not on file    Gets together: Not on file    Attends religious service: Not on file    Active member of club or organization: Not on file    Attends meetings of clubs or organizations: Not on file    Relationship status: Not on file  Other Topics Concern  . Not on file  Social History Narrative  . Not on file     Family History: The patient's family history includes Cancer in his mother; Heart failure in his father.  ROS:   Please see the history of present illness.  Additional pertinent ROS: Constitutional: Negative for chills, fever, night sweats, unintentional weight loss  HENT: Negative for ear pain and hearing loss.   Eyes: Negative for loss of vision and eye pain.  Respiratory: Negative for cough, sputum, wheezing.   Cardiovascular: See HPI. Gastrointestinal: Negative for abdominal pain, melena, and hematochezia.  Genitourinary: Negative for dysuria and hematuria.  Musculoskeletal: Negative for falls and myalgias.  Skin: Negative for itching and rash.  Neurological: Negative for focal weakness, focal  sensory changes and loss of consciousness.  Endo/Heme/Allergies: Does not bruise/bleed easily.    EKGs/Labs/Other Studies Reviewed:    The following studies were reviewed today: Recent hospitalization/surgery reports Intraop TEE Echo Findings   Left Ventricle LV systolic function is low normal with an EF of 50-55%.  Septum No atrial septal defect present. No Patent Foramen Ovale present.  Aortic Valve The valve is trileaflet. Mild to moderate regurgitation.  Aorta Dissection present  Mitral Valve Normal valve structure. Trace regurgitation.  Tricuspid Valve Normal tricuspid valve structure.   EKG:  The ekg ordered today demonstrates normal sinus rhythm, LVH  Recent Labs: 03/10/2018: ALT 40 03/17/2018: Magnesium 2.4 03/23/2018: B Natriuretic Peptide 519.7; Hemoglobin 8.4; Platelets 517 12/01/2018: BUN 19; Creatinine, Ser 1.29; Potassium 5.0; Sodium 139  Recent Lipid Panel    Component Value Date/Time   CHOL  168 07/02/2018 1047   TRIG 66 07/02/2018 1047   HDL 51 07/02/2018 1047   CHOLHDL 3.3 07/02/2018 1047   LDLCALC 104 (H) 07/02/2018 1047    Physical Exam:    VS:  BP (!) 145/84   Pulse 64   Ht 6\' 1"  (1.854 m)   Wt 226 lb 1.6 oz (102.6 kg)   SpO2 96%   BMI 29.83 kg/m     Wt Readings from Last 3 Encounters:  03/03/19 226 lb 1.6 oz (102.6 kg)  01/18/19 221 lb (100.2 kg)  12/01/18 223 lb 9.6 oz (101.4 kg)  GEN: Well nourished, well developed in no acute distress HEENT: Normal, moist mucous membranes NECK: No JVD CARDIAC: regular rhythm, normal S1 and S2, no murmurs, rubs, gallops.  VASCULAR: Radial and DP pulses 2+ bilaterally. No carotid bruits RESPIRATORY:  Clear to auscultation without rales, wheezing or rhonchi  ABDOMEN: Soft, non-tender, non-distended MUSCULOSKELETAL:  Ambulates independently SKIN: Warm and dry, no edema NEUROLOGIC:  Alert and oriented x 3. No focal neuro deficits noted. PSYCHIATRIC:  Normal affect   ASSESSMENT:    1. Essential  hypertension   2. History of aortic dissection   3. S/P repair ascending aortic dissection   4. Medication management   5. Counseling on health promotion and disease prevention    PLAN:    Type A aortic dissection, s/p repair: I spent significant time today discussing the importance of BP control to prevent this from recurring. Discussed life threatening risk of elevated BP with his aneurysm/prior dissection -continue metoprolol succinate 200 mg daily.  -on lisinopril 20 mg daily, last BMET with slight worsening with Cr. -recheck BMET today -following with Dr. Cornelius Moras -states that he is not smoking -taking aspirin 81 mg -discussed statin for secondary prevention, does not want more pills than is mandatory. Continue to address  Hypertension: reviewed this at length today. He is frustrated, wants to be off medications, does not like pills, and yet wants to live a long time for his kids. We discussed that this can only be done with good BP control. -he needs to be committed to his health. Stressed importance of good BP control. Asked him to start taking home BP again. Offered PharmD, declined -lisinopril as above -metoprolol as above -adding amlodipine today  Discussed in brief today: Atrial tachycardia vs. Flutter: seen briefly postop, treated with amio. Denies recurrence.    Secondary prevention: -recommend heart healthy/Mediterranean diet, with whole grains, fruits, vegetable, fish, lean meats, nuts, and olive oil. Limit salt. -recommend moderate walking, 3-5 times/week for 30-50 minutes each session. Aim for at least 150 minutes.week. Goal should be pace of 3 miles/hours, or walking 1.5 miles in 30 minutes -recommend avoidance of tobacco products. Avoid excess alcohol.  Plan for follow up: 3 mos with me  Medication Adjustments/Labs and Tests Ordered: Current medicines are reviewed at length with the patient today.  Concerns regarding medicines are outlined above.  Orders Placed This  Encounter  Procedures  . Basic metabolic panel  . EKG 12-Lead   Meds ordered this encounter  Medications  . amLODipine (NORVASC) 5 MG tablet    Sig: Take 1 tablet (5 mg total) by mouth daily.    Dispense:  90 tablet    Refill:  3    Patient Instructions  Medication Instructions:  Start: Amlodipine 5 mg daily  If you need a refill on your cardiac medications before your next appointment, please call your pharmacy.   Lab work: Your physician recommends that you  return for lab work today (BMP)    Testing/Procedures: None  Follow-Up: At [facility], you and your health needs are our priority.  As part of our continuing mission to provide you with exceptional heart care, we have created designated Provider Care Teams.  These Care Teams include your primary Cardiologist (physician) and Advanced Practice Providers (APPs -  Physician Assistants and Nurse Practitioners) who all work together to provide you with the care you need, when you need it. You will need a follow up appointment in 3 months.  Please call our office 2 months in advance to schedule this appointment.  You may see Jodelle Red, MD or one of the following Advanced Practice Providers on your designated Care Team:   Theodore Demark, PA-C . Joni Reining, DNP, ANP        Signed, Jodelle Red, MD PhD 03/03/2019 11:49 AM    [facility]

## 2019-03-03 NOTE — Patient Instructions (Signed)
Medication Instructions:  Start: Amlodipine 5 mg daily  If you need a refill on your cardiac medications before your next appointment, please call your pharmacy.   Lab work: Your physician recommends that you return for lab work today (BMP)    Testing/Procedures: None  Follow-Up: At [facility], you and your health needs are our priority.  As part of our continuing mission to provide you with exceptional heart care, we have created designated Provider Care Teams.  These Care Teams include your primary Cardiologist (physician) and Advanced Practice Providers (APPs -  Physician Assistants and Nurse Practitioners) who all work together to provide you with the care you need, when you need it. You will need a follow up appointment in 3 months.  Please call our office 2 months in advance to schedule this appointment.  You may see Buford Dresser, MD or one of the following Advanced Practice Providers on your designated Care Team:   Rosaria Ferries, PA-C . Jory Sims, DNP, ANP

## 2019-03-04 LAB — BASIC METABOLIC PANEL
BUN/Creatinine Ratio: 11 (ref 9–20)
BUN: 12 mg/dL (ref 6–20)
CO2: 22 mmol/L (ref 20–29)
Calcium: 9.8 mg/dL (ref 8.7–10.2)
Chloride: 103 mmol/L (ref 96–106)
Creatinine, Ser: 1.13 mg/dL (ref 0.76–1.27)
GFR calc Af Amer: 94 mL/min/{1.73_m2} (ref >59)
GFR calc non Af Amer: 81 mL/min/{1.73_m2} (ref >59)
Glucose: 105 mg/dL — ABNORMAL HIGH (ref 65–99)
Potassium: 4.9 mmol/L (ref 3.5–5.2)
Sodium: 141 mmol/L (ref 134–144)

## 2019-03-05 LAB — CYTOLOGY, (ORAL, ANAL, URETHRAL) ANCILLARY ONLY
Chlamydia: NEGATIVE
Neisseria Gonorrhea: NEGATIVE
Trichomonas: NEGATIVE

## 2019-03-08 ENCOUNTER — Ambulatory Visit: Admission: RE | Admit: 2019-03-08 | Payer: Self-pay | Source: Ambulatory Visit

## 2019-03-08 ENCOUNTER — Other Ambulatory Visit: Payer: Self-pay | Admitting: Thoracic Surgery (Cardiothoracic Vascular Surgery)

## 2019-03-08 ENCOUNTER — Telehealth: Payer: Self-pay | Admitting: Thoracic Surgery (Cardiothoracic Vascular Surgery)

## 2019-03-08 ENCOUNTER — Other Ambulatory Visit: Payer: Self-pay

## 2019-03-08 DIAGNOSIS — Z8679 Personal history of other diseases of the circulatory system: Secondary | ICD-10-CM

## 2019-03-15 ENCOUNTER — Other Ambulatory Visit: Payer: Self-pay | Admitting: Thoracic Surgery (Cardiothoracic Vascular Surgery)

## 2019-03-15 ENCOUNTER — Ambulatory Visit
Admission: RE | Admit: 2019-03-15 | Discharge: 2019-03-15 | Disposition: A | Discharge status: Home / Self Care | Payer: Self-pay | Source: Ambulatory Visit | Attending: Thoracic Surgery (Cardiothoracic Vascular Surgery) | Admitting: Thoracic Surgery (Cardiothoracic Vascular Surgery)

## 2019-03-15 ENCOUNTER — Telehealth: Payer: Self-pay | Admitting: Thoracic Surgery (Cardiothoracic Vascular Surgery)

## 2019-03-15 ENCOUNTER — Ambulatory Visit: Admission: RE | Admit: 2019-03-15 | Payer: Self-pay | Source: Ambulatory Visit

## 2019-03-15 DIAGNOSIS — Z8679 Personal history of other diseases of the circulatory system: Secondary | ICD-10-CM

## 2019-03-15 MED ORDER — IOPAMIDOL (ISOVUE-370) INJECTION 76%
75.0000 mL | Freq: Once | INTRAVENOUS | Status: AC | PRN
  Administered 2019-03-15: 75 mL via INTRAVENOUS

## 2019-03-18 ENCOUNTER — Telehealth (INDEPENDENT_AMBULATORY_CARE_PROVIDER_SITE_OTHER): Payer: Self-pay | Admitting: Thoracic Surgery (Cardiothoracic Vascular Surgery)

## 2019-03-18 ENCOUNTER — Other Ambulatory Visit: Payer: Self-pay

## 2019-03-18 DIAGNOSIS — Z9889 Other specified postprocedural states: Secondary | ICD-10-CM

## 2019-03-18 DIAGNOSIS — Z8679 Personal history of other diseases of the circulatory system: Secondary | ICD-10-CM

## 2019-03-18 NOTE — Progress Notes (Signed)
[address]       [place] [id number]             [phone]     CARDIOTHORACIC SURGERY TELEPHONE VIRTUAL OFFICE NOTE  Referring Provider is Derrick HandyGibbons, Claudia J, PA-C Primary Cardiologist is Derrick RedBridgette Christopher, MD PCP is Derrick Gipouglas, Andre, FNP   HPI:  I spoke with Derrick Clayton (DOB 07/02/1980 ) via telephone on 03/18/2019 at 10:23 AM and verified that I was speaking with the correct person using more than one form of identification.  We discussed the reason(s) for conducting our visit virtually instead of in-person.  The patient expressed understanding the circumstances and agreed to proceed as described.   Patient is a 38 year old African-American male with history of hypertension who underwent emergency repair of acute type A aortic dissection with resuspension of the native aortic valve and open hemi-arch distal anastomosis on March 11, 2018.  His postoperative recovery was uneventful and he was last seen here in our office on October 26, 2018.  At that time follow-up CT angiogram suggested some interval enlargement of the proximal descending thoracic aorta.  Patient underwent another follow-up CT angiogram on March 15, 2019 and I called him today to discuss the results and see how he has been doing.  The patient states that clinically he is doing very well.  He is back at work and getting around very well.  He reports no pain in his chest or back that could be at all related to his chronically dissected aorta.  He states that his blood pressure has been under better control, although this did require recently adding a third medication (amlodipine).  Overall he has no complaints.   Current Outpatient Medications  Medication Sig Dispense Refill   amLODipine (NORVASC) 5 MG tablet Take 1 tablet (5 mg total) by mouth daily. 90 tablet 3   aspirin EC 81 MG tablet Take 81 mg by mouth daily.     lisinopril (ZESTRIL) 20 MG tablet Take 1 tablet (20 mg total) by mouth daily.  90 tablet 3   metoprolol succinate (TOPROL-XL) 200 MG 24 hr tablet Take 1 tablet (200 mg total) by mouth daily. Take with or immediately following a meal. 90 tablet 3   No current facility-administered medications for this visit.      Diagnostic Tests:  CT ANGIOGRAPHY CHEST, ABDOMEN AND PELVIS  TECHNIQUE: Multidetector CT imaging through the chest, abdomen and pelvis was performed using the standard protocol during bolus administration of intravenous contrast. Multiplanar reconstructed images and MIPs were obtained and reviewed to evaluate the vascular anatomy.  CONTRAST:  75mL ISOVUE-370 IOPAMIDOL (ISOVUE-370) INJECTION 76%  COMPARISON:  October 26, 2018.  FINDINGS: CTA CHEST FINDINGS  Cardiovascular: Status post surgical repair of ascending thoracic aorta. Grossly stable thoracic aortic dissection is noted that begins in the transverse thoracic aorta and extends through the descending thoracic aorta into the abdominal aorta. There is noted extension of the dissection flap into the right innominate and subclavian arteries, as well as into the left subclavian artery. This is stable compared to prior exam. 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 Normal cardiac size. No pericardial effusion.  Mediastinum/Nodes: No enlarged mediastinal, hilar, or axillary lymph nodes. Thyroid gland, trachea, and esophagus demonstrate no significant findings.  Lungs/Pleura: Lungs are clear. No pleural effusion or pneumothorax.  Musculoskeletal: No chest wall abnormality. No acute or significant osseous findings.  Review of the  MIP images confirms the above findings.  CTA ABDOMEN AND PELVIS FINDINGS  VASCULAR  Aorta: Previously noted thoracic aortic dissection extends through the abdominal aorta to the aortic bifurcation. No aneurysm is noted. This is stable compared to prior exam.  Celiac:  Patent without evidence of aneurysm, dissection, vasculitis or significant stenosis. Arises from true lumen.  SMA: Patent without evidence of aneurysm, dissection, vasculitis or significant stenosis. Arises from true lumen.  Renals: Dissection flap extends into origin of right renal artery, while extending into a large portion of the proximal left renal artery. This is unchanged compared to prior exam.  IMA: Patent without evidence of aneurysm, dissection, vasculitis or significant stenosis. Supplied by true lumen.  Inflow: Dissection flap extends slightly into the proximal portions of both common iliac arteries. This is unchanged compared to prior exam. No involvement of the internal or external arteries is noted. No significant stenosis is noted.  Veins: No obvious venous abnormality within the limitations of this arterial phase study.  Review of the MIP images confirms the above findings.  NON-VASCULAR  Hepatobiliary: No focal liver abnormality is seen. No gallstones, gallbladder wall thickening, or biliary dilatation.  Pancreas: Unremarkable. No pancreatic ductal dilatation or surrounding inflammatory changes.  Spleen: Normal in size without focal abnormality.  Adrenals/Urinary Tract: Adrenal glands are unremarkable. Kidneys are normal, without renal calculi, focal lesion, or hydronephrosis. Bladder is unremarkable.  Stomach/Bowel: Stomach is within normal limits. Appendix appears normal. No evidence of bowel wall thickening, distention, or inflammatory changes.  Lymphatic: No significant adenopathy is noted.  Reproductive: Prostate is unremarkable.  Other: No abdominal wall hernia or abnormality. No abdominopelvic ascites.  Musculoskeletal: No acute or significant osseous findings.  Review of the MIP images confirms the above findings.  IMPRESSION: Grossly stable thoracic aortic dissection extending from transverse aortic arch and through  descending thoracic aorta and into the abdominal aorta to the level of the bifurcation.  Dissection flaps are again noted to extend into the right innominate, right subclavian and left subclavian arteries, as well as involving the proximal portions of both renal arteries. This is unchanged compared to prior exam.  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  Status post surgical repair of ascending thoracic aorta.  Aortic Atherosclerosis (ICD10-I70.0).   Electronically Signed   By: Lupita Raider M.D.   On: 03/15/2019 16:10    Impression:  I have personally reviewed the patient's recent follow-up CT angiogram which demonstrates stable radiographic appearance of both the repaired segment of the ascending aorta and proximal transverse aortic arch as well as continued stability of the distal aortic arch and descending thoracic aorta.  Specifically, there does not appear to have been any significant further enlargement of the proximal descending thoracic aorta which measures approximately 4.9 cm in its greatest diameter.    Plan:  We plan another follow-up CT angiogram in approximately 6 months.  The patient was encouraged to continue to monitor his blood pressure very carefully.  We have not recommended any changes to his medications.  All questions answered.    I discussed limitations of evaluation and management via telephone.  The patient was advised to call back for repeat telephone consultation or to seek an in-person evaluation if questions arise or the patient's clinical condition changes in any significant manner.  I spent in excess of 10 minutes of non-face-to-face time during the conduct of this telephone virtual office consultation.    Salvatore Decent. Cornelius Moras, MD 03/18/2019  10:23 AM

## 2019-03-18 NOTE — Patient Instructions (Signed)
Continue all previous medications without any changes at this time  

## 2019-06-07 ENCOUNTER — Telehealth: Payer: Self-pay | Admitting: Internal Medicine

## 2019-06-07 NOTE — Telephone Encounter (Signed)
Called, no answer. Left a message to call back for clarification.

## 2019-06-23 ENCOUNTER — Telehealth (INDEPENDENT_AMBULATORY_CARE_PROVIDER_SITE_OTHER): Payer: Self-pay | Admitting: Cardiology

## 2019-06-23 ENCOUNTER — Encounter: Payer: Self-pay | Admitting: Cardiology

## 2019-06-23 VITALS — Ht 73.0 in | Wt 224.0 lb

## 2019-06-23 DIAGNOSIS — Z9889 Other specified postprocedural states: Secondary | ICD-10-CM

## 2019-06-23 DIAGNOSIS — Z8679 Personal history of other diseases of the circulatory system: Secondary | ICD-10-CM

## 2019-06-23 DIAGNOSIS — I10 Essential (primary) hypertension: Secondary | ICD-10-CM

## 2019-06-23 DIAGNOSIS — Z7189 Other specified counseling: Secondary | ICD-10-CM

## 2019-06-23 DIAGNOSIS — Z716 Tobacco abuse counseling: Secondary | ICD-10-CM

## 2019-06-23 NOTE — Patient Instructions (Signed)
Medication Instructions:  Your physician recommends that you continue on your current medications as directed. Please refer to the Current Medication list given to you today.  *If you need a refill on your cardiac medications before your next appointment, please call your pharmacy*  Lab Work: NONE  Testing/Procedures: NONE  Follow-Up: At [facility], you and your health needs are our priority.  As part of our continuing mission to provide you with exceptional heart care, we have created designated Provider Care Teams.  These Care Teams include your primary Cardiologist (physician) and Advanced Practice Providers (APPs -  Physician Assistants and Nurse Practitioners) who all work together to provide you with the care you need, when you need it.  Your next appointment:   4-6 week(s)  The format for your next appointment:   Virtual Visit   Provider:   Jodelle Red, MD  Other Instructions Start monitoring blood pressure at home if possible.  Take blood pressure once daily in the evenings and keep log of readings.

## 2019-06-23 NOTE — Progress Notes (Signed)
Virtual Visit via Telephone Note   This visit type was conducted due to national recommendations for restrictions regarding the COVID-19 Pandemic (e.g. social distancing) in an effort to limit this patient's exposure and mitigate transmission in our community.  Due to his co-morbid illnesses, this patient is at least at moderate risk for complications without adequate follow up.  This format is felt to be most appropriate for this patient at this time.  The patient did not have access to video technology/had technical difficulties with video requiring transitioning to audio format only (telephone).  All issues noted in this document were discussed and addressed.  No physical exam could be performed with this format.  Please refer to the patient's chart for his  consent to telehealth for [facility].   Date:  06/23/2019   ID:  Derrick Clayton, DOB [birth year], MRN [id number]  Patient Location: Home Provider Location: Home  PCP:  Mike Gip, FNP  Cardiologist:  Jodelle Red, MD  Electrophysiologist:  None   Evaluation Performed:  Follow-Up Visit  Chief Complaint:  Follow up  History of Present Illness:    Derrick Clayton is a 39 y.o. male with a hx of hypertension, acute ascending aortic dissection s/p repair 02/2018.  Cardiac history: He presented to the ER 02/2018 with chest pain and hypertensive emergency. He had severe chest and foot pain, and he was found to have an ascending aortic dissection and no doppler flow to the left foot. Dissection also involved right carotid, celiac axis, SMA and bilateral iliac arteries. He was emergently taken for REPAIR OF ASCENDING AORTIC DISECTION USING STRAIGHT HEMASHIELD PLATINUM VASCULAR GRAFT; OPEN HEMIARCH DISTAL ANASTOMOSIS AND RESUSPENSION OF THE NATIVE AORTIC VALVE. His hospitalization was notable for possible atrial tachycardia vs. Flutter, but risk was considered to outweigh benefit of anticoagulation. He presented to the ER on  03/23/18 for peripheral leg edema and had lasix increased for a week.   The patient does not have symptoms concerning for COVID-19 infection (fever, chills, cough, or new shortness of breath).   Today: No concerns/issues that he wants to discuss today. Hasn't been checking his blood pressure at home. We discussed ways to make this more realistic for him. We brainstormed some ideas for this, different times of day, how to check BP properly, etc. He will try this and keep a log.  Discussed tobacco cessation again today. Stopped for a time, but then restarted though less than before.   Has been walking, exercising without issue. Medications working well, no concerns. Has a lot of stress with his job, living situation, etc.   We spent time today discussing how he can incorporate healthy patterns of behavior so that he can be around for his kids.  Denies chest pain, shortness of breath at rest or with normal exertion. No PND, orthopnea, LE edema or unexpected weight gain. No syncope or palpitations.  Past Medical History:  Diagnosis Date  . Acute thoracic aortic dissection (HCC) 03/10/2018  . Hypertension   . S/P aortic dissection repair 03/11/2018   Straight graft replacement of ascending thoracic aorta with resuspension of native aortic valve and open hemi-arch distal anastomosis   Past Surgical History:  Procedure Laterality Date  . AORTIC INTERVENTION N/A 03/10/2018   Procedure: RESUSPENSION OF THE NATIVE AORTIC VALVE;  Surgeon: Purcell Nails, MD;  Location: [facility] OR;  Service: Open Heart Surgery;  Laterality: N/A;  . AORTIC VALVE REPAIR N/A 03/10/2018   Procedure: REPAIR OF ASCENDING AORTIC DISECTION USING STRAIGHT HEMASHIELD PLATINUM  26MM VASCULAR GRAFT; OPEN HEMIARCH DISTAL ANASTOMOSIS;  Surgeon: Rexene Alberts, MD;  Location: [place];  Service: Open Heart Surgery;  Laterality: N/A;  . FOOT SURGERY       Current Meds  Medication Sig  . amLODipine (NORVASC) 5 MG tablet Take 1 tablet  (5 mg total) by mouth daily.  Marland Kitchen aspirin EC 81 MG tablet Take 81 mg by mouth daily.  Marland Kitchen lisinopril (ZESTRIL) 20 MG tablet Take 1 tablet (20 mg total) by mouth daily.  . metoprolol succinate (TOPROL-XL) 200 MG 24 hr tablet Take 1 tablet (200 mg total) by mouth daily. Take with or immediately following a meal.     Allergies:   Patient has no known allergies.   Social History   Tobacco Use  . Smoking status: Former Smoker    Packs/day: 0.00    Types: Cigarettes  . Smokeless tobacco: Never Used  Substance Use Topics  . Alcohol use: Not Currently    Comment: 3-4 days a week  . Drug use: Yes    Types: Marijuana    Comment: daily     Family Hx: The patient's family history includes Cancer in his mother; Heart failure in his father.  ROS:   Please see the history of present illness.    All other systems reviewed and are negative.   Prior CV studies:   The following studies were reviewed today: CTA 03/31/2019 with stable dissection measurements, stable post op measurements  Labs/Other Tests and Data Reviewed:    EKG:  An ECG dated 03/03/19 was personally reviewed today and demonstrated:  NSR, LVH  Recent Labs: 03/03/2019: BUN 12; Creatinine, Ser 1.13; Potassium 4.9; Sodium 141   Recent Lipid Panel Lab Results  Component Value Date/Time   CHOL 168 07/02/2018 10:47 AM   TRIG 66 07/02/2018 10:47 AM   HDL 51 07/02/2018 10:47 AM   CHOLHDL 3.3 07/02/2018 10:47 AM   LDLCALC 104 (H) 07/02/2018 10:47 AM    Wt Readings from Last 3 Encounters:  06/23/19 224 lb (101.6 kg)  03/03/19 226 lb 1.6 oz (102.6 kg)  01/18/19 221 lb (100.2 kg)     Objective:    Vital Signs:  Ht 6\' 1"  (1.854 m)   Wt 224 lb (101.6 kg)   BMI 29.55 kg/m    Speaking comfortably on the phone, no audible wheezing In no acute distress Alert and oriented Normal affect Normal speech  ASSESSMENT & PLAN:    Type A aortic dissection, s/p repair: we again discussed the importance of tight control.  -he has  difficulty checking BP in the AM. Recommended he try finding time in the afternoon, after work, once he has had a chance to rest -continue metoprolol succinate 200 mg daily.  -on lisinopril 20 mg daily, last BMET 02/2019 stable -on amlodipine 5 mg as below -back to smoking, counseled today -taking aspirin 81 mg -discussed statin for secondary prevention, does not want more pills than is mandatory. Continue to address  Hypertension: no vitals today. He has a cuff but has not been checking -see above. We discussed options for this. He will try to check BP more routinely, easier for him to do after work. -lisinopril as above -metoprolol as above -amlodipine as above -we discussed overall cardiovascular risk today and taking steps to prevent this. He is focused on being there for his kids, used this as motivation today.  Tobacco cessation: The patient was counseled on tobacco cessation today for 3 minutes.  Counseling included reviewing the  risks of smoking tobacco products, how it impacts the patient's current medical diagnoses and different strategies for quitting.  Pharmacotherapy to aid in tobacco cessation was not prescribed today.  COVID-19 Education: The signs and symptoms of COVID-19 were discussed with the patient and how to seek care for testing (follow up with PCP or arrange E-visit).  The importance of social distancing was discussed today.  Time:   Today, I have spent 11 minutes via phone with the patient with telehealth technology discussing the above problems.  Total time 20 minutes including chart review/other specialist notes, imaging review, and documentation. This is independent of tobacco cessation time counseling.  Patient Instructions  Medication Instructions:  Your physician recommends that you continue on your current medications as directed. Please refer to the Current Medication list given to you today.  *If you need a refill on your cardiac medications before your  next appointment, please call your pharmacy*  Lab Work: NONE  Testing/Procedures: NONE  Follow-Up: At [facility], you and your health needs are our priority.  As part of our continuing mission to provide you with exceptional heart care, we have created designated Provider Care Teams.  These Care Teams include your primary Cardiologist (physician) and Advanced Practice Providers (APPs -  Physician Assistants and Nurse Practitioners) who all work together to provide you with the care you need, when you need it.  Your next appointment:   4-6 week(s)  The format for your next appointment:   Virtual Visit   Provider:   Jodelle Red, MD  Other Instructions Start monitoring blood pressure at home if possible.  Take blood pressure once daily in the evenings and keep log of readings.     Signed, Jodelle Red, MD  06/23/2019 9:02 PM    [facility]

## 2019-07-20 ENCOUNTER — Ambulatory Visit: Payer: Self-pay | Admitting: Family Medicine

## 2019-07-22 ENCOUNTER — Telehealth: Payer: Self-pay | Admitting: Cardiology

## 2019-07-22 ENCOUNTER — Telehealth: Payer: Self-pay

## 2019-07-22 NOTE — Telephone Encounter (Signed)
Attempted to contact pt x 3 for scheduled virtual appointment. Unable to leave message as VM is not set up. Pt will need to reschedule appointment.

## 2019-07-26 ENCOUNTER — Other Ambulatory Visit: Payer: Self-pay

## 2019-07-26 ENCOUNTER — Ambulatory Visit (HOSPITAL_COMMUNITY)
Admission: EM | Admit: 2019-07-26 | Discharge: 2019-07-26 | Disposition: A | Discharge status: Home / Self Care | Payer: Self-pay | Attending: Family Medicine | Admitting: Family Medicine

## 2019-07-26 ENCOUNTER — Encounter (HOSPITAL_COMMUNITY): Payer: Self-pay

## 2019-07-26 DIAGNOSIS — L0291 Cutaneous abscess, unspecified: Secondary | ICD-10-CM

## 2019-07-26 MED ORDER — AMOXICILLIN-POT CLAVULANATE 875-125 MG PO TABS: 1.0000 | ORAL_TABLET | Freq: Two times a day (BID) | ORAL | 0 refills | Status: DC

## 2019-07-26 MED ORDER — IBUPROFEN 800 MG PO TABS: 800.0000 mg | ORAL_TABLET | Freq: Three times a day (TID) | ORAL | 0 refills | Status: DC

## 2019-07-26 NOTE — ED Triage Notes (Signed)
Pt presents with abscess on left groin area X 2 days.

## 2019-07-26 NOTE — Discharge Instructions (Signed)
Take the antibiotic 2 times a day Warm compresses to area Take ibuprofen 3 times a day with food Return if needed

## 2019-07-26 NOTE — ED Provider Notes (Signed)
[facility]    CSN: [id number] Arrival date & time: 07/26/19  1437      History   Chief Complaint Chief Complaint  Patient presents with  . Abscess    Left Groin    HPI Derrick Clayton is a 39 y.o. male.   HPI   Patient is here for an abscess.  Pain and swelling in his left groin.  Is been present for 2 days.  He has been working full duty.  No fever or chills.  No penile discharge.  Past Medical History:  Diagnosis Date  . Acute thoracic aortic dissection (HCC) 03/10/2018  . Hypertension   . S/P aortic dissection repair 03/11/2018   Straight graft replacement of ascending thoracic aorta with resuspension of native aortic valve and open hemi-arch distal anastomosis    Patient Active Problem List   Diagnosis Date Noted  . History of aortic dissection 07/02/2018  . History of paroxysmal atrial tachycardia 07/02/2018  . S/P repair ascending aortic dissection 03/11/2018  . Uncontrolled hypertension 03/10/2018  . TOBACCO DEPENDENCE 07/24/2006  . Essential hypertension 07/24/2006    Past Surgical History:  Procedure Laterality Date  . AORTIC INTERVENTION N/A 03/10/2018   Procedure: RESUSPENSION OF THE NATIVE AORTIC VALVE;  Surgeon: Purcell Nails, MD;  Location: [facility] OR;  Service: Open Heart Surgery;  Laterality: N/A;  . AORTIC VALVE REPAIR N/A 03/10/2018   Procedure: REPAIR OF ASCENDING AORTIC DISECTION USING STRAIGHT HEMASHIELD PLATINUM VASCULAR GRAFT; OPEN HEMIARCH DISTAL ANASTOMOSIS;  Surgeon: Purcell Nails, MD;  Location: [facility] OR;  Service: Open Heart Surgery;  Laterality: N/A;  . FOOT SURGERY         Home Medications    Prior to Admission medications   Medication Sig Start Date End Date Taking? Authorizing Provider  amLODipine (NORVASC) 5 MG tablet Take 1 tablet (5 mg total) by mouth daily. 03/03/19   Jodelle Red, MD  amoxicillin-clavulanate (AUGMENTIN) 875-125 MG tablet Take 1 tablet by mouth every 12 (twelve) hours. 07/26/19   Eustace Moore, MD  aspirin EC 81 MG tablet Take 81 mg by mouth daily.    [provider]  ibuprofen (ADVIL) 800 MG tablet Take 1 tablet (800 mg total) by mouth 3 (three) times daily. 07/26/19   Eustace Moore, MD  lisinopril (ZESTRIL) 20 MG tablet Take 1 tablet (20 mg total) by mouth daily. 12/01/18 11/26/19  Jodelle Red, MD  metoprolol succinate (TOPROL-XL) 200 MG 24 hr tablet Take 1 tablet (200 mg total) by mouth daily. Take with or immediately following a meal. 12/01/18 11/26/19  Jodelle Red, MD    Family History Family History  Problem Relation Age of Onset  . Cancer Mother   . Heart failure Father     Social History Social History   Tobacco Use  . Smoking status: Former Smoker    Packs/day: 0.00    Types: Cigarettes  . Smokeless tobacco: Never Used  Substance Use Topics  . Alcohol use: Not Currently    Comment: 3-4 days a week  . Drug use: Yes    Types: Marijuana    Comment: daily     Allergies   Patient has no known allergies.   Review of Systems Review of Systems  Skin: Positive for wound.     Physical Exam Triage Vital Signs ED Triage Vitals  Enc Vitals Group     BP 07/26/19 1552 (!) 156/89     Pulse Rate 07/26/19 1552 63     Resp  07/26/19 1552 18     Temp 07/26/19 1552 98.3 F (36.8 C)     Temp Source 07/26/19 1552 Oral     SpO2 07/26/19 1552 100 %     Weight --      Height --      Head Circumference --      Peak Flow --      Pain Score 07/26/19 1553 8     Pain Loc --      Pain Edu? --      Excl. in GC? --    No data found.  Updated Vital Signs BP (!) 156/89 (BP Location: Right Arm)   Pulse 63   Temp 98.3 F (36.8 C) (Oral)   Resp 18   SpO2 100%   Visual Acuity Right Eye Distance:   Left Eye Distance:   Bilateral Distance:    Right Eye Near:   Left Eye Near:    Bilateral Near:     Physical Exam Vitals and nursing note reviewed.  Constitutional:      General: He is not in acute distress.    Appearance:  He is well-developed.     Comments: Observed  HENT:     Head: Normocephalic and atraumatic.  Eyes:     Conjunctiva/sclera: Conjunctivae normal.     Pupils: Pupils are equal, round, and reactive to light.  Cardiovascular:     Rate and Rhythm: Normal rate.  Pulmonary:     Effort: Pulmonary effort is normal. No respiratory distress.  Abdominal:     General: There is no distension.     Palpations: Abdomen is soft.  Musculoskeletal:        General: Normal range of motion.     Cervical back: Normal range of motion.  Skin:    General: Skin is warm and dry.     Comments: And right groin there is induration.  There is no fluctuance.  There is erythema that 4 to 5 cm across.  Patient insists he has an abscess.  The center of this area was anesthetized with 1% lidocaine, 1 cc wheal.  An 18-gauge needle was implanted into the center and withdrawn, no purulence obtained  Neurological:     Mental Status: He is alert.  Psychiatric:        Mood and Affect: Mood normal.        Behavior: Behavior normal.      UC Treatments / Results  Labs (all labs ordered are listed, but only abnormal results are displayed) Labs Reviewed - No data to display  EKG   Radiology No results found.  Procedures Procedures (including critical care time)  Medications Ordered in UC Medications - No data to display  Initial Impression / Assessment and Plan / UC Course  I have reviewed the triage vital signs and the nursing notes.  Pertinent labs & imaging results that were available during my care of the patient were reviewed by me and considered in my medical decision making (see chart for details).     I explained to the patient that with no pocket, there was nothing to incise.  I recommend antibiotics warm compresses, return as needed. Final Clinical Impressions(s) / UC Diagnoses   Final diagnoses:  Abscess     Discharge Instructions     Take the antibiotic 2 times a day Warm compresses to area  Take ibuprofen 3 times a day with food Return if needed   ED Prescriptions    Medication Sig Dispense Auth.  Provider   amoxicillin-clavulanate (AUGMENTIN) 875-125 MG tablet Take 1 tablet by mouth every 12 (twelve) hours. 14 tablet Raylene Everts, MD   ibuprofen (ADVIL) 800 MG tablet Take 1 tablet (800 mg total) by mouth 3 (three) times daily. 21 tablet Raylene Everts, MD     PDMP not reviewed this encounter.   Raylene Everts, MD 07/26/19 2028

## 2019-07-27 ENCOUNTER — Other Ambulatory Visit: Payer: Self-pay | Admitting: Thoracic Surgery (Cardiothoracic Vascular Surgery)

## 2019-07-27 DIAGNOSIS — Z9889 Other specified postprocedural states: Secondary | ICD-10-CM

## 2019-09-20 ENCOUNTER — Ambulatory Visit: Payer: Self-pay

## 2019-09-20 ENCOUNTER — Encounter: Payer: Self-pay | Admitting: Thoracic Surgery (Cardiothoracic Vascular Surgery)

## 2019-09-20 ENCOUNTER — Other Ambulatory Visit: Payer: Self-pay

## 2019-09-21 NOTE — Progress Notes (Signed)
This encounter was created in error - please disregard.

## 2019-10-01 ENCOUNTER — Telehealth: Payer: Self-pay | Admitting: Nurse Practitioner

## 2019-10-01 NOTE — Telephone Encounter (Signed)
error 

## 2019-10-04 ENCOUNTER — Other Ambulatory Visit: Payer: Self-pay | Admitting: Nurse Practitioner

## 2019-10-04 ENCOUNTER — Ambulatory Visit (INDEPENDENT_AMBULATORY_CARE_PROVIDER_SITE_OTHER): Payer: Self-pay | Admitting: Nurse Practitioner

## 2019-10-04 ENCOUNTER — Encounter: Payer: Self-pay | Admitting: Nurse Practitioner

## 2019-10-04 ENCOUNTER — Other Ambulatory Visit: Payer: Self-pay

## 2019-10-04 VITALS — BP 136/78 | HR 71 | Ht 73.0 in | Wt 239.8 lb

## 2019-10-04 DIAGNOSIS — R350 Frequency of micturition: Secondary | ICD-10-CM

## 2019-10-04 DIAGNOSIS — I10 Essential (primary) hypertension: Secondary | ICD-10-CM

## 2019-10-04 DIAGNOSIS — R590 Localized enlarged lymph nodes: Secondary | ICD-10-CM

## 2019-10-04 DIAGNOSIS — Z789 Other specified health status: Secondary | ICD-10-CM

## 2019-10-04 DIAGNOSIS — R140 Abdominal distension (gaseous): Secondary | ICD-10-CM

## 2019-10-04 DIAGNOSIS — Z7289 Other problems related to lifestyle: Secondary | ICD-10-CM

## 2019-10-04 DIAGNOSIS — K5909 Other constipation: Secondary | ICD-10-CM

## 2019-10-04 DIAGNOSIS — E669 Obesity, unspecified: Secondary | ICD-10-CM

## 2019-10-04 NOTE — Progress Notes (Signed)
[facility] [address] [place], [place]  [address] Phone:  [phone]   Fax:  [phone]   Acute Office Visit  Subjective:    Patient ID: Derrick Clayton, male    DOB: [birth year], 39 y.o.   MRN: [id number]  Chief Complaint  Patient presents with  . Recurrent Skin Infections    boil in goins,  having blowing in stomach , notice 3-4 weeks     HPI Patient is in today for follow up. He  has a past medical history of Acute thoracic aortic dissection (HCC) (03/10/2018), Hypertension, and S/P aortic dissection repair (03/11/2018).   Epidermal Cyst Patient complains of a subcutaneous nodule located over the groin.  This has been present for 4 days.  There has been pain.  Patient does not have a history of epidermal inclusion cysts.  Abdominal Pain Patient complains of abdominal pain. The pain is located generalized. The pain is described as none, and is 0/10 in intensity. Onset was 1 month ago. Symptoms have been gradually getting worse. Aggravating factors include eating.  Alleviating factors include none. Associated symptoms include constipation. The patient denies anorexia, arthralgias, belching, chills, diarrhea, dysuria, fever, flatus, headache, hematochezia, hematuria, melena, myalgias, nausea, sweats and vomiting. He does drink alcohol hard liquor; 3-4 times per week.   He aslo asked about left hand/finger pain. Onset was sudden, related to no specific injury. Mechanism of injury: none. The pain is mild, worsens with movement, and is relieved by movement. There is no associated numbness, tingling, weakness in index finger. Evaluation to date: none. Treatment to date: nothing specific.   Past Medical History:  Diagnosis Date  . Acute thoracic aortic dissection (HCC) 03/10/2018  . Hypertension   . 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 AORTIC INTERVENTION N/A 03/10/2018   Procedure: RESUSPENSION OF THE NATIVE AORTIC VALVE;  Surgeon: Purcell Nails, MD;  Location: [facility] OR;  Service: Open Heart Surgery;  Laterality: N/A;  . AORTIC VALVE REPAIR N/A 03/10/2018   Procedure: REPAIR OF ASCENDING AORTIC DISECTION USING STRAIGHT HEMASHIELD PLATINUM VASCULAR GRAFT; OPEN HEMIARCH DISTAL ANASTOMOSIS;  Surgeon: Purcell Nails, MD;  Location: [facility] OR;  Service: Open Heart Surgery;  Laterality: N/A;  . FOOT SURGERY      Family History  Problem Relation Age of Onset  . Cancer Mother   . Heart failure Father     Social History   Socioeconomic History  . Marital status: Single    Spouse name: Not on file  . Number of children: Not on file  . Years of education: Not on file  . Highest education level: Not on file  Occupational History  . Not on file  Tobacco Use  . Smoking status: Former Smoker    Packs/day: 0.00    Types: Cigarettes  . Smokeless tobacco: Never Used  Substance and Sexual Activity  . Alcohol use: Not Currently    Comment: 3-4 days a week  . Drug use: Yes    Types: Marijuana    Comment: daily  . Sexual activity: Not on file  Other Topics Concern  . Not on file  Social History Narrative  . Not on file   Social Determinants of Health   Financial Resource Strain:   . Difficulty of Paying Living Expenses:   Food  Insecurity:   . Worried About Programme researcher, broadcasting/film/video in the Last Year:   . Barista in the Last Year:   Transportation Needs:   . Freight forwarder (Medical):   Marland Kitchen Lack of Transportation (Non-Medical):   Physical Activity:   . Days of Exercise per Week:   . Minutes of Exercise per Session:   Stress:   . Feeling of Stress :   Social Connections:   . Frequency of Communication with Friends and Family:   . Frequency of Social Gatherings with Friends and Family:   . Attends Religious Services:   . Active  Member of Clubs or Organizations:   . Attends Banker Meetings:   Marland Kitchen Marital Status:   Intimate Partner Violence:   . Fear of Current or Ex-Partner:   . Emotionally Abused:   Marland Kitchen Physically Abused:   . Sexually Abused:     Outpatient Medications Prior to Visit  Medication Sig Dispense Refill  . amLODipine (NORVASC) 5 MG tablet Take 1 tablet (5 mg total) by mouth daily. 90 tablet 3  . aspirin EC 81 MG tablet Take 81 mg by mouth daily.    Marland Kitchen ibuprofen (ADVIL) 800 MG tablet Take 1 tablet (800 mg total) by mouth 3 (three) times daily. 21 tablet 0  . lisinopril (ZESTRIL) 20 MG tablet Take 1 tablet (20 mg total) by mouth daily. 90 tablet 3  . metoprolol succinate (TOPROL-XL) 200 MG 24 hr tablet Take 1 tablet (200 mg total) by mouth daily. Take with or immediately following a meal. 90 tablet 3  . amoxicillin-clavulanate (AUGMENTIN) 875-125 MG tablet Take 1 tablet by mouth every 12 (twelve) hours. (Patient not taking: Reported on 10/04/2019) 14 tablet 0   No facility-administered medications prior to visit.    No Known Allergies  Review of Systems  All other systems reviewed and are negative.      Objective:    Physical Exam Constitutional:      Appearance: He is obese.  HENT:     Head: Normocephalic.  Cardiovascular:     Rate and Rhythm: Normal rate and regular rhythm.     Pulses: Normal pulses.  Pulmonary:     Effort: Pulmonary effort is normal.     Breath sounds: Normal breath sounds.  Abdominal:     General: Bowel sounds are normal.     Palpations: Abdomen is soft. There is no mass.     Tenderness: There is no abdominal tenderness. There is no guarding or rebound.     Hernia: No hernia is present.     Comments: Obese  Genitourinary:    Comments: Inguinal nodes enlarged on right Musculoskeletal:        General: Normal range of motion.     Cervical back: Normal range of motion.  Neurological:     Mental Status: He is alert and oriented to person, place, and  time.        Right hand:  normal exam, no swelling, tenderness, instability; ligaments intact, full ROM both hands, wrists, and finger joints  Left hand:  normal exam, no swelling, tenderness, instability; ligaments intact, full ROM both hands, wrists, and finger joints  BP 136/78 (BP Location: Right Arm, Patient Position: Sitting)   Pulse 71   Ht 6\' 1"  (1.854 m)   Wt 239 lb 12.8 oz (108.8 kg)   SpO2 100%   BMI 31.64 kg/m  Wt Readings from Last 3 Encounters:  10/04/19 239 lb 12.8 oz (108.8  kg)  06/23/19 224 lb (101.6 kg)  03/03/19 226 lb 1.6 oz (102.6 kg)    Health Maintenance Due  Topic Date Due  . HIV Screening  Never done  . COVID-19 Vaccine (1) Never done    There are no preventive care reminders to display for this patient.   No results found for: TSH Lab Results  Component Value Date   WBC 14.2 (H) 03/23/2018   HGB 8.4 (L) 03/23/2018   HCT 27.7 (L) 03/23/2018   MCV 95.8 03/23/2018   PLT 517 (H) 03/23/2018   Lab Results  Component Value Date   NA 141 03/03/2019   K 4.9 03/03/2019   CO2 22 03/03/2019   GLUCOSE 105 (H) 03/03/2019   BUN 12 03/03/2019   CREATININE 1.13 03/03/2019   BILITOT 0.6 03/10/2018   ALKPHOS 53 03/10/2018   AST 36 03/10/2018   ALT 40 03/10/2018   PROT 6.9 03/10/2018   ALBUMIN 3.9 03/10/2018   CALCIUM 9.8 03/03/2019   ANIONGAP 8 03/23/2018   Lab Results  Component Value Date   CHOL 168 07/02/2018   Lab Results  Component Value Date   HDL 51 07/02/2018   Lab Results  Component Value Date   LDLCALC 104 (H) 07/02/2018   Lab Results  Component Value Date   TRIG 66 07/02/2018   Lab Results  Component Value Date   CHOLHDL 3.3 07/02/2018   Lab Results  Component Value Date   HGBA1C 5.3 03/23/2018       Assessment & Plan:   Problem List Items Addressed This Visit      Unprioritized   Essential hypertension - Primary (Chronic)   Relevant Orders   POCT Urinalysis Dipstick    Other Visit Diagnoses    Alcohol use        Education to be provided   Relevant Orders   Comp. Metabolic Panel (12)   Lipase   Amylase   Other constipation       Education to be provided   Abdominal bloating       Education to be provided   Relevant Orders   Comp. Metabolic Panel (12)   Lipase   Amylase   Obesity (BMI 30-39.9)       Education to be provided   Inguinal adenopathy       labs pending  Doxycyline 100mg  # 10 BID x5 days hand written Prescription provide for patient due to power failure.        No orders of the defined types were placed in this encounter.    Vevelyn Francois, NP

## 2019-10-04 NOTE — Patient Instructions (Signed)
Alcohol Use Disorder Alcohol use disorder is when your drinking disrupts your daily life. When you have this condition, you drink too much alcohol and you cannot control your drinking. Alcohol use disorder can cause serious problems with your physical health. It can affect your brain, heart, liver, pancreas, immune system, stomach, and intestines. Alcohol use disorder can increase your risk for certain cancers and cause problems with your mental health, such as depression, anxiety, psychosis, delirium, and dementia. People with this disorder risk hurting themselves and others. What are the causes? This condition is caused by drinking too much alcohol over time. It is not caused by drinking too much alcohol only one or two times. Some people with this condition drink alcohol to cope with or escape from negative life events. Others drink to relieve pain or symptoms of mental illness. What increases the risk? You are more likely to develop this condition if:  You have a family history of alcohol use disorder.  Your culture encourages drinking to the point of intoxication, or makes alcohol easy to get.  You had a mood or conduct disorder in childhood.  You have been a victim of abuse.  You are an adolescent and: ? You have poor grades or difficulties in school. ? Your caregivers do not talk to you about saying no to alcohol, or supervise your activities. ? You are impulsive or you have trouble with self-control. What are the signs or symptoms? Symptoms of this condition include:  Drinkingmore than you want to.  Drinking for longer than you want to.  Trying several times to drink less or to control your drinking.  Spending a lot of time getting alcohol, drinking, or recovering from drinking.  Craving alcohol.  Having problems at work, at school, or at home due to drinking.  Having problems in relationships due to drinking.  Drinking when it is dangerous to drink, such as before  driving a car.  Continuing to drink even though you know you might have a physical or mental problem related to drinking.  Needing more and more alcohol to get the same effect you want from the alcohol (building up tolerance).  Having symptoms of withdrawal when you stop drinking. Symptoms of withdrawal include: ? Fatigue. ? Nightmares. ? Trouble sleeping. ? Depression. ? Anxiety. ? Fever. ? Seizures. ? Severe confusion. ? Feeling or seeing things that are not there (hallucinations). ? Tremors. ? Rapid heart rate. ? Rapid breathing. ? High blood pressure.  Drinking to avoid symptoms of withdrawal. How is this diagnosed? This condition is diagnosed with an assessment. Your health care provider may start the assessment by asking three or four questions about your drinking. Your health care provider may perform a physical exam or do lab tests to see if you have physical problems resulting from alcohol use. She or he may refer you to a mental health professional for evaluation. How is this treated? Some people with alcohol use disorder are able to reduce their alcohol use to low-risk levels. Others need to completely quit drinking alcohol. When necessary, mental health professionals with specialized training in substance use treatment can help. Your health care provider can help you decide how severe your alcohol use disorder is and what type of treatment you need. The following forms of treatment are available:  Detoxification. Detoxification involves quitting drinking and using prescription medicines within the first week to help lessen withdrawal symptoms. This treatment is important for people who have had withdrawal symptoms before and for heavy drinkers   of treatment you need. The following forms of treatment are available:  · Detoxification. Detoxification involves quitting drinking and using prescription medicines within the first week to help lessen withdrawal symptoms. This treatment is important for people who have had withdrawal symptoms before and for heavy drinkers who are likely to have withdrawal symptoms. Alcohol withdrawal can be dangerous, and in severe cases, it can cause death. Detoxification may be provided in a home, community, or primary care setting, or in a hospital or substance use  treatment facility.  · Counseling. This treatment is also called talk therapy. It is provided by substance use treatment counselors. A counselor can address the reasons you use alcohol and suggest ways to keep you from drinking again or to prevent problem drinking. The goals of talk therapy are to:  ? Find healthy activities and ways for you to cope with stress.  ? Identify and avoid the things that trigger your alcohol use.  ? Help you learn how to handle cravings.  · Medicines. Medicines can help treat alcohol use disorder by:  ? Decreasing alcohol cravings.  ? Decreasing the positive feeling you have when you drink alcohol.  ? Causing an uncomfortable physical reaction when you drink alcohol (aversion therapy).  · Support groups. Support groups are led by people who have quit drinking. They provide emotional support, advice, and guidance.  These forms of treatment are often combined. Some people with this condition benefit from a combination of treatments provided by specialized substance use treatment centers.  Follow these instructions at home:  · Take over-the-counter and prescription medicines only as told by your health care provider.  · Check with your health care provider before starting any new medicines.  · Ask friends and family members not to offer you alcohol.  · Avoid situations where alcohol is served, including gatherings where others are drinking alcohol.  · Create a plan for what to do when you are tempted to use alcohol.  · Find hobbies or activities that you enjoy that do not include alcohol.  · Keep all follow-up visits as told by your health care provider. This is important.  How is this prevented?  · If you drink, limit alcohol intake to no more than 1 drink a day for nonpregnant women and 2 drinks a day for men. One drink equals 12 oz of beer, 5 oz of wine, or 1½ oz of hard liquor.  · If you have a mental health condition, get treatment and support.  · Do not give alcohol to  adolescents.  · If you are an adolescent:  ? Do not drink alcohol.  ? Do not be afraid to say no if someone offers you alcohol. Speak up about why you do not want to drink. You can be a positive role model for your friends and set a good example for those around you by not drinking alcohol.  ? If your friends drink, spend time with others who do not drink alcohol. Make new friends who do not use alcohol.  ? Find healthy ways to manage stress and emotions, such as meditation or deep breathing, exercise, spending time in nature, listening to music, or talking with a trusted friend or family member.  Contact a health care provider if:  · You are not able to take your medicines as told.  · Your symptoms get worse.  · You return to drinking alcohol (relapse) and your symptoms get worse.  Get help right away if:  · You have thoughts   life. When you have this condition, you drink too much alcohol and you cannot control your drinking.  Treatment may include detoxification, counseling, medicine, and support groups.  Ask friends and family members not to offer you alcohol. Avoid situations where alcohol is served.  Get help right away if you have thoughts about hurting yourself or others. This information is not intended to replace advice given to you by your health care provider. Make sure you discuss any questions you have with your health care provider. Document Revised: 04/25/2017 Document Reviewed: 02/08/2016 Elsevier Patient Education  [place].   Constipation, Adult Constipation is when a person:  Poops (has a bowel movement) fewer times in a week than normal.  Has a hard time pooping.  Has poop that is dry, hard, or bigger than normal. Follow these instructions at home: Eating and drinking   Eat foods that have a lot of fiber, such as: ? Fresh fruits and vegetables. ? Whole grains. ? Beans.  Eat less of foods that are high in fat, low in fiber, or overly processed, such as: ? [place] fries. ? Hamburgers. ? Cookies. ? Candy. ? Soda.  Drink enough fluid to keep your pee (urine) clear or pale yellow. General instructions  Exercise regularly or as told by your doctor.  Go to the restroom when you feel like you need to poop. Do not hold it in.  Take over-the-counter and prescription medicines only as told by your doctor. These include any fiber supplements.  Do pelvic floor retraining exercises, such as: ? Doing deep breathing while relaxing your lower belly (abdomen). ? Relaxing your pelvic floor while pooping.  Watch your condition for any changes.  Keep all follow-up visits as told by your doctor. This is important. Contact a doctor if:  You have pain that gets worse.  You have a fever.  You have not pooped for 4 days.  You throw up (vomit).  You are not hungry.  You lose weight.  You are bleeding from the anus.  You have thin, pencil-like poop (stool). Get help right away if:  You have a fever, and your symptoms suddenly get worse.  You leak poop or have blood in your poop.  Your belly feels hard or bigger than normal (is bloated).  You have very bad belly pain.  You feel dizzy or you faint. This information is not intended to replace advice given to you by your health care provider. Make sure you discuss any questions you have with your health care provider. Document Revised: 04/25/2017 Document Reviewed: 11/01/2015 Elsevier Patient Education  [place].  Abdominal  Bloating When you have abdominal bloating, your abdomen may feel full, tight, or painful. It may also look bigger than normal or swollen (distended). Common causes of abdominal bloating include:  Swallowing air.  Constipation.  Problems digesting food.  Eating too much.  Irritable bowel syndrome. This is a condition that affects the large intestine.  Lactose intolerance. This is an inability to digest lactose, a natural sugar in dairy products.  Celiac disease. This is a condition that affects the ability to digest gluten, a protein found in some grains.  Gastroparesis. This is a condition that slows down the movement of food in the stomach and small intestine. It is more common in people with diabetes mellitus.  Gastroesophageal reflux disease (GERD). This is a digestive condition that makes stomach acid flow back into the esophagus.  Urinary retention. This means that the body is holding  onto urine, and the bladder cannot be emptied all the way. Follow these instructions at home: Eating and drinking  Avoid eating too much.  Try not to swallow air while talking or eating.  Avoid eating while lying down.  Avoid these foods and drinks: ? Foods that cause gas, such as broccoli, cabbage, cauliflower, and baked beans. ? Carbonated drinks. ? Hard candy. ? Chewing gum. Medicines  Take over-the-counter and prescription medicines only as told by your health care provider.  Take probiotic medicines. These medicines contain live bacteria or yeasts that can help digestion.  Take coated peppermint oil capsules. Activity  Try to exercise regularly. Exercise may help to relieve bloating that is caused by gas and relieve constipation. General instructions  Keep all follow-up visits as told by your health care provider. This is important. Contact a health care provider if:  You have nausea and vomiting.  You have diarrhea.  You have abdominal pain.  You have unusual weight  loss or weight gain.  You have severe pain, and medicines do not help. Get help right away if:  You have severe chest pain.  You have trouble breathing.  You have shortness of breath.  You have trouble urinating.  You have darker urine than normal.  You have blood in your stools or have dark, tarry stools. Summary  Abdominal bloating means that the abdomen is swollen.  Common causes of abdominal bloating are swallowing air, constipation, and problems digesting food.  Avoid eating too much and avoid swallowing air.  Avoid foods that cause gas, carbonated drinks, hard candy, and chewing gum. This information is not intended to replace advice given to you by your health care provider. Make sure you discuss any questions you have with your health care provider. Document Revised: 08/31/2018 Document Reviewed: 06/14/2016 Elsevier Patient Education  2020 [employer]   Epidermal Cyst  An epidermal cyst is a small, painless lump under your skin. The cyst contains a grayish-white, bad-smelling substance (keratin). Do not try to pop or open an epidermal cyst yourself. What are the causes?  A blocked hair follicle.  A hair that curls and re-enters the skin instead of growing straight out of the skin.  A blocked pore.  Irritated skin.  An injury to the skin.  Certain conditions that are passed along from parent to child (inherited).  Human papillomavirus (HPV).  Long-term sun damage to the skin. What increases the risk?  Having acne.  Being overweight.  Being 74-31 years old. What are the signs or symptoms? These cysts are usually harmless, but they can get infected. Symptoms of infection may include:  Redness.  Inflammation.  Tenderness.  Warmth.  Fever.  A grayish-white, bad-smelling substance drains from the cyst.  Pus drains from the cyst. How is this treated? In many cases, epidermal cysts go away on their own without treatment. If a cyst becomes  infected, treatment may include:  Opening and draining the cyst, done by a doctor. After draining, you may need minor surgery to remove the rest of the cyst.  Antibiotic medicine.  Shots of medicines (steroids) that help to reduce inflammation.  Surgery to remove the cyst. Surgery may be done if the cyst: ? Becomes large. ? Bothers you. ? Has a chance of turning into cancer.  Do not try to open a cyst yourself. Follow these instructions at home:  Take over-the-counter and prescription medicines only as told by your doctor.  If you were prescribed an antibiotic medicine, take it it  as told by your doctor. Do not stop using the antibiotic even if you start to feel better.  Keep the area around your cyst clean and dry.  Wear loose, dry clothing.  Avoid touching your cyst.  Check your cyst every day for signs of infection. Check for: ? Redness, swelling, or pain. ? Fluid or blood. ? Warmth. ? Pus or a bad smell.  Keep all follow-up visits as told by your doctor. This is important. How is this prevented?  Wear clean, dry, clothing.  Avoid wearing tight clothing.  Keep your skin clean and dry. Take showers or baths every day. Contact a doctor if:  Your cyst has symptoms of infection.  Your condition does not improve or gets worse.  You have a cyst that looks different from other cysts you have had.  You have a fever. Get help right away if:  Redness spreads from the cyst into the area close by. Summary  An epidermal cyst is a sac made of skin tissue.  If a cyst becomes infected, treatment may include surgery to open and drain the cyst, or to remove it.  Take over-the-counter and prescription medicines only as told by your doctor.  Contact a doctor if your condition is not improving or is getting worse.  Keep all follow-up visits as told by your doctor. This is important. This information is not intended to replace advice given to you by your health care  provider. Make sure you discuss any questions you have with your health care provider. Document Revised: 09/03/2018 Document Reviewed: 02/19/2018 Elsevier Patient Education  2020 [employer].

## 2019-10-05 ENCOUNTER — Other Ambulatory Visit: Payer: Self-pay

## 2019-10-05 NOTE — Addendum Note (Signed)
Addended by: Myles Gip on: 10/05/2019 03:38 PM   Modules accepted: Orders

## 2019-10-06 LAB — POCT URINALYSIS DIPSTICK
Bilirubin, UA: NEGATIVE
Glucose, UA: NEGATIVE
Ketones, UA: NEGATIVE
Leukocytes, UA: NEGATIVE
Nitrite, UA: NEGATIVE
Protein, UA: POSITIVE — AB
Spec Grav, UA: 1.025 (ref 1.010–1.025)
Urobilinogen, UA: 0.2 E.U./dL
pH, UA: 6 (ref 5.0–8.0)

## 2019-10-06 LAB — CBC WITH DIFFERENTIAL/PLATELET
Basophils Absolute: 0.1 10*3/uL (ref 0.0–0.2)
Basos: 1 %
EOS (ABSOLUTE): 0.1 10*3/uL (ref 0.0–0.4)
Eos: 2 %
Hematocrit: 45.7 % (ref 37.5–51.0)
Hemoglobin: 16 g/dL (ref 13.0–17.7)
Immature Grans (Abs): 0 10*3/uL (ref 0.0–0.1)
Immature Granulocytes: 0 %
Lymphocytes Absolute: 2.3 10*3/uL (ref 0.7–3.1)
Lymphs: 41 %
MCH: 30.4 pg (ref 26.6–33.0)
MCHC: 35 g/dL (ref 31.5–35.7)
MCV: 87 fL (ref 79–97)
Monocytes Absolute: 0.5 10*3/uL (ref 0.1–0.9)
Monocytes: 9 %
Neutrophils Absolute: 2.6 10*3/uL (ref 1.4–7.0)
Neutrophils: 47 %
Platelets: 316 10*3/uL (ref 150–450)
RBC: 5.26 x10E6/uL (ref 4.14–5.80)
RDW: 12.9 % (ref 11.6–15.4)
WBC: 5.5 10*3/uL (ref 3.4–10.8)

## 2019-10-06 LAB — COMP. METABOLIC PANEL (12)
AST: 27 IU/L (ref 0–40)
Albumin/Globulin Ratio: 2 (ref 1.2–2.2)
Albumin: 4.9 g/dL (ref 4.0–5.0)
Alkaline Phosphatase: 70 IU/L (ref 39–117)
BUN/Creatinine Ratio: 12 (ref 9–20)
BUN: 15 mg/dL (ref 6–24)
Bilirubin Total: 0.5 mg/dL (ref 0.0–1.2)
Calcium: 10.1 mg/dL (ref 8.7–10.2)
Chloride: 103 mmol/L (ref 96–106)
Creatinine, Ser: 1.27 mg/dL (ref 0.76–1.27)
GFR calc Af Amer: 81 mL/min/{1.73_m2} (ref >59)
GFR calc non Af Amer: 70 mL/min/{1.73_m2} (ref >59)
Globulin, Total: 2.5 g/dL (ref 1.5–4.5)
Glucose: 110 mg/dL — ABNORMAL HIGH (ref 65–99)
Potassium: 4.4 mmol/L (ref 3.5–5.2)
Sodium: 138 mmol/L (ref 134–144)
Total Protein: 7.4 g/dL (ref 6.0–8.5)

## 2019-10-06 LAB — LIPASE: Lipase: 37 U/L (ref 13–78)

## 2019-10-06 LAB — AMYLASE: Amylase: 63 U/L (ref 31–110)

## 2019-10-06 NOTE — Addendum Note (Signed)
Addended by: Becky Sax on: 10/06/2019 02:35 PM   Modules accepted: Orders

## 2019-10-07 LAB — URINALYSIS
Bilirubin, UA: NEGATIVE
Glucose, UA: NEGATIVE
Ketones, UA: NEGATIVE
Leukocytes,UA: NEGATIVE
Nitrite, UA: NEGATIVE
Protein,UA: NEGATIVE
RBC, UA: NEGATIVE
Specific Gravity, UA: 1.02 (ref 1.005–1.030)
Urobilinogen, Ur: 0.2 mg/dL (ref 0.2–1.0)
pH, UA: 6 (ref 5.0–7.5)

## 2019-12-04 ENCOUNTER — Other Ambulatory Visit: Payer: Self-pay | Admitting: Cardiology

## 2019-12-04 DIAGNOSIS — Z9889 Other specified postprocedural states: Secondary | ICD-10-CM

## 2019-12-04 DIAGNOSIS — I10 Essential (primary) hypertension: Secondary | ICD-10-CM

## 2019-12-04 DIAGNOSIS — Z8679 Personal history of other diseases of the circulatory system: Secondary | ICD-10-CM

## 2019-12-07 IMAGING — DX DG CHEST 1V PORT
1 series · 1 of 1 positions shown · non-contrast
Comparison: 03/11/2018

CLINICAL DATA: Postoperative atelectasis.

EXAM:
PORTABLE CHEST 1 VIEW

[chest]
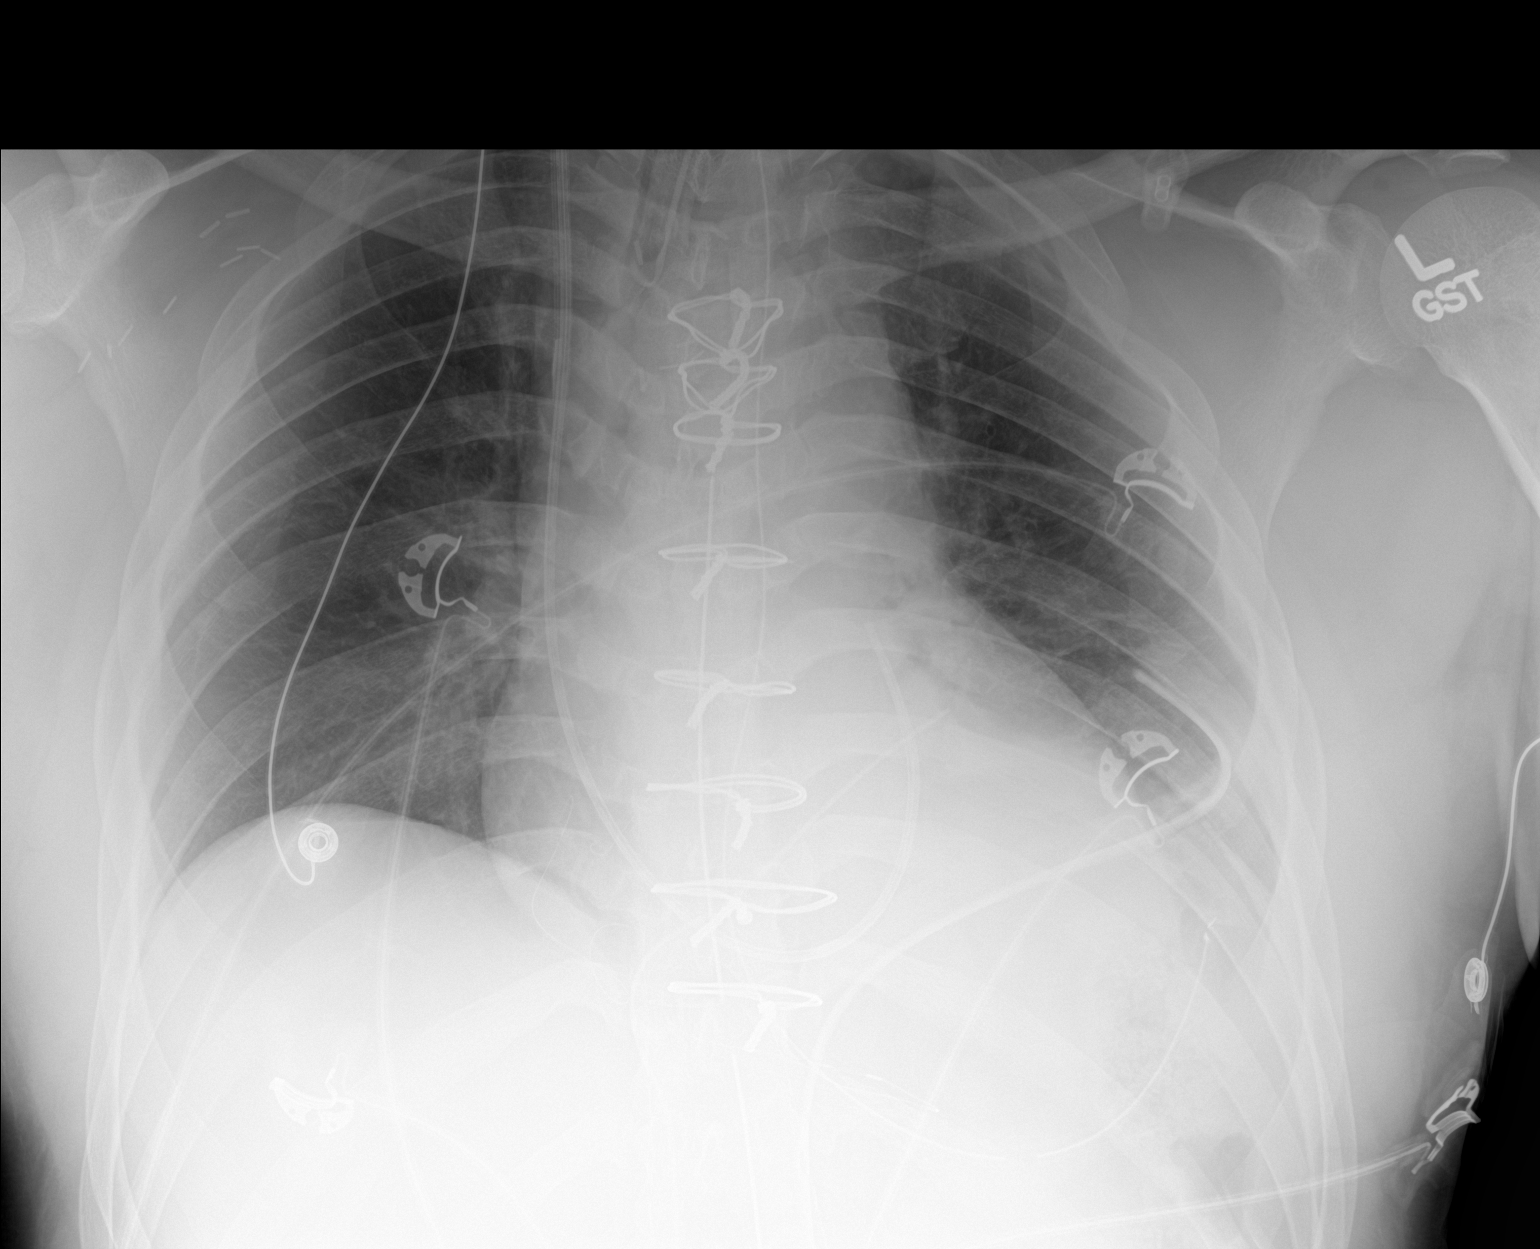

[1 of 1 positions shown; findings below may reference images not displayed]

FINDINGS: Postoperative changes in the mediastinum. Endotracheal tube with tip
measuring 4.9 cm above the carina. Right Swan-Ganz catheter with tip
over the pulmonary outflow tract. Left chest tube and mediastinal
drain. Enteric tube with tip in the left upper quadrant consistent
with location in the upper stomach. Cardiac enlargement. No
pulmonary vascular congestion. Atelectasis or infiltration in the
left lung base. No pleural effusions. No pneumothorax. Surgical
clips in the right axilla.
IMPRESSION: Appliances appear in satisfactory location. Cardiac enlargement.
Atelectasis or infiltration in the left lung base.

## 2019-12-07 IMAGING — CR DG CHEST 1V PORT
1 series · 1 of 1 positions shown · non-contrast
Comparison: CT  03/10/2018

CLINICAL DATA: Chest pain postop

EXAM:
PORTABLE CHEST 1 VIEW

[AP]
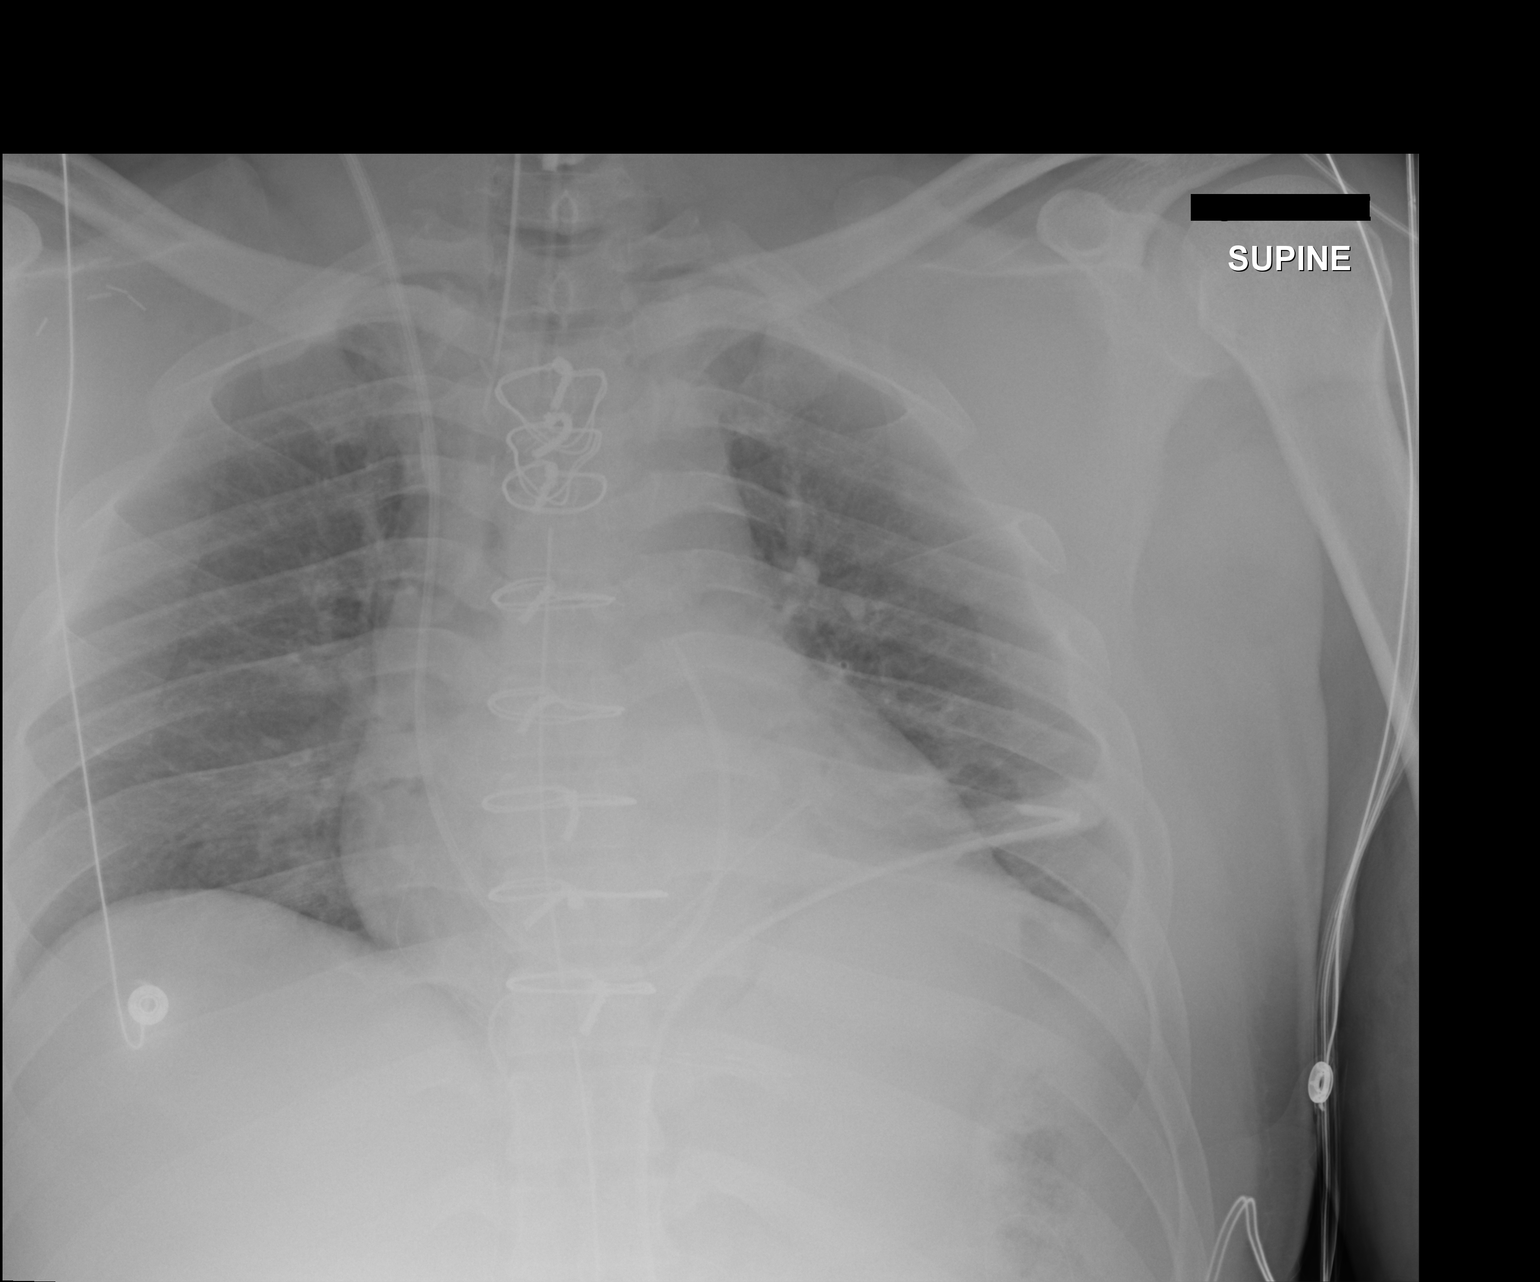

[1 of 1 positions shown; findings below may reference images not displayed]

FINDINGS: Interval intubation, endotracheal tube tip is about 4.5 cm superior
to the carina. Interval sternotomy with placement of mediastinal and
left lower chest drainage catheters. Right IJ Swan-Ganz catheter tip
overlying the pulmonary trunk. Low lung volumes. Non inclusion of
the right CP angle. Atelectasis at the left base. Cardiomediastinal
silhouette within normal limits. No pneumothorax.
IMPRESSION: 1. Interval placement of support lines and tubes as above.
2. Low lung volumes with mild atelectasis at the left base. No
unexpected radiopaque foreign bodies are seen.

## 2019-12-08 IMAGING — DX DG CHEST 1V PORT
1 series · 1 of 1 positions shown · non-contrast
Comparison: 03/11/2018

CLINICAL DATA: Follow-up chest tube

EXAM:
PORTABLE CHEST 1 VIEW

[chest]
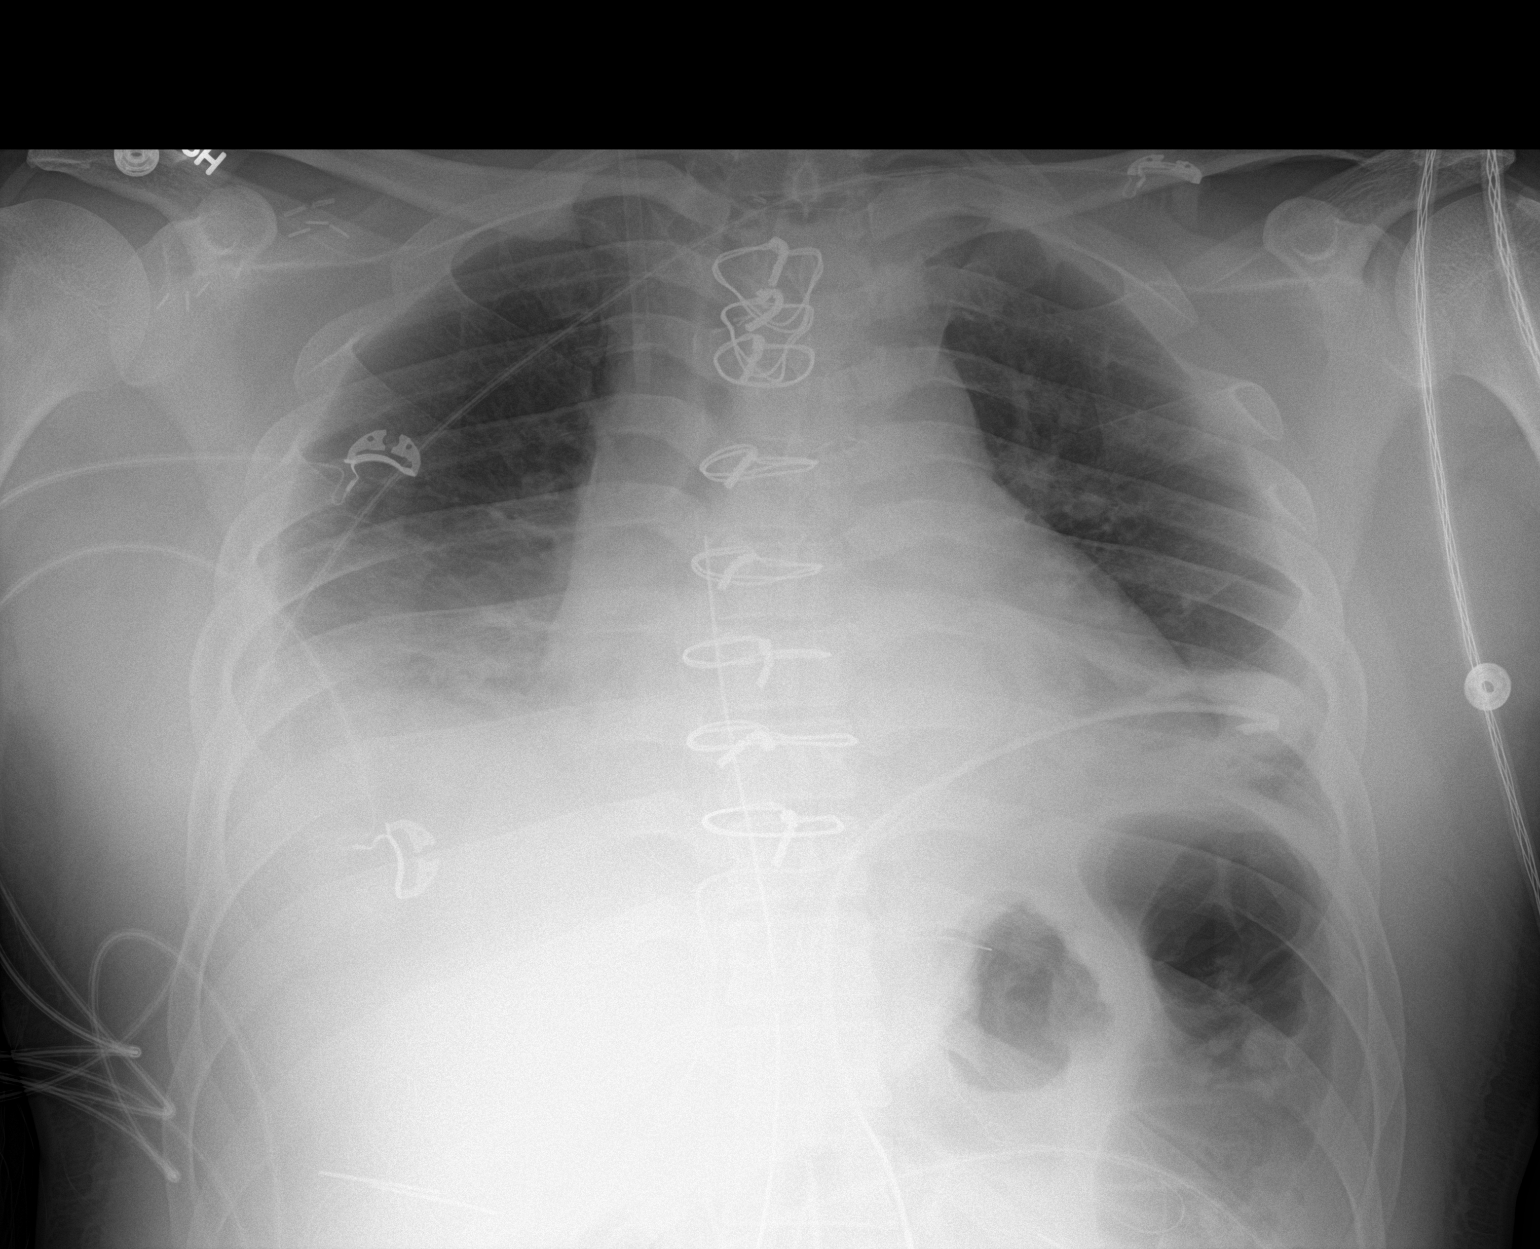

[1 of 1 positions shown; findings below may reference images not displayed]

FINDINGS: Cardiac shadow is mildly prominent. Postsurgical changes are again
seen. Mediastinal drain and left thoracostomy catheter are again
identified and stable. Pericardial drain is noted as well. Swan-Ganz
catheter, endotracheal tube and nasogastric catheter have been
removed in the interval. Right jugular sheath remains in place.
Bibasilar atelectatic changes are noted increased when compare with
the prior exam likely related to a poor inspiratory effort.
IMPRESSION: New increase in bibasilar atelectasis likely related to poor
inspiratory effort.

No pneumothorax is noted.

Tubes and lines as described.

## 2019-12-10 IMAGING — CR DG CHEST 2V
2 series · 2 of 2 positions shown · non-contrast
Comparison: One-view chest x-ray 03/12/2018

CLINICAL DATA: Atelectasis.

EXAM:
CHEST - 2 VIEW

[chest lat]
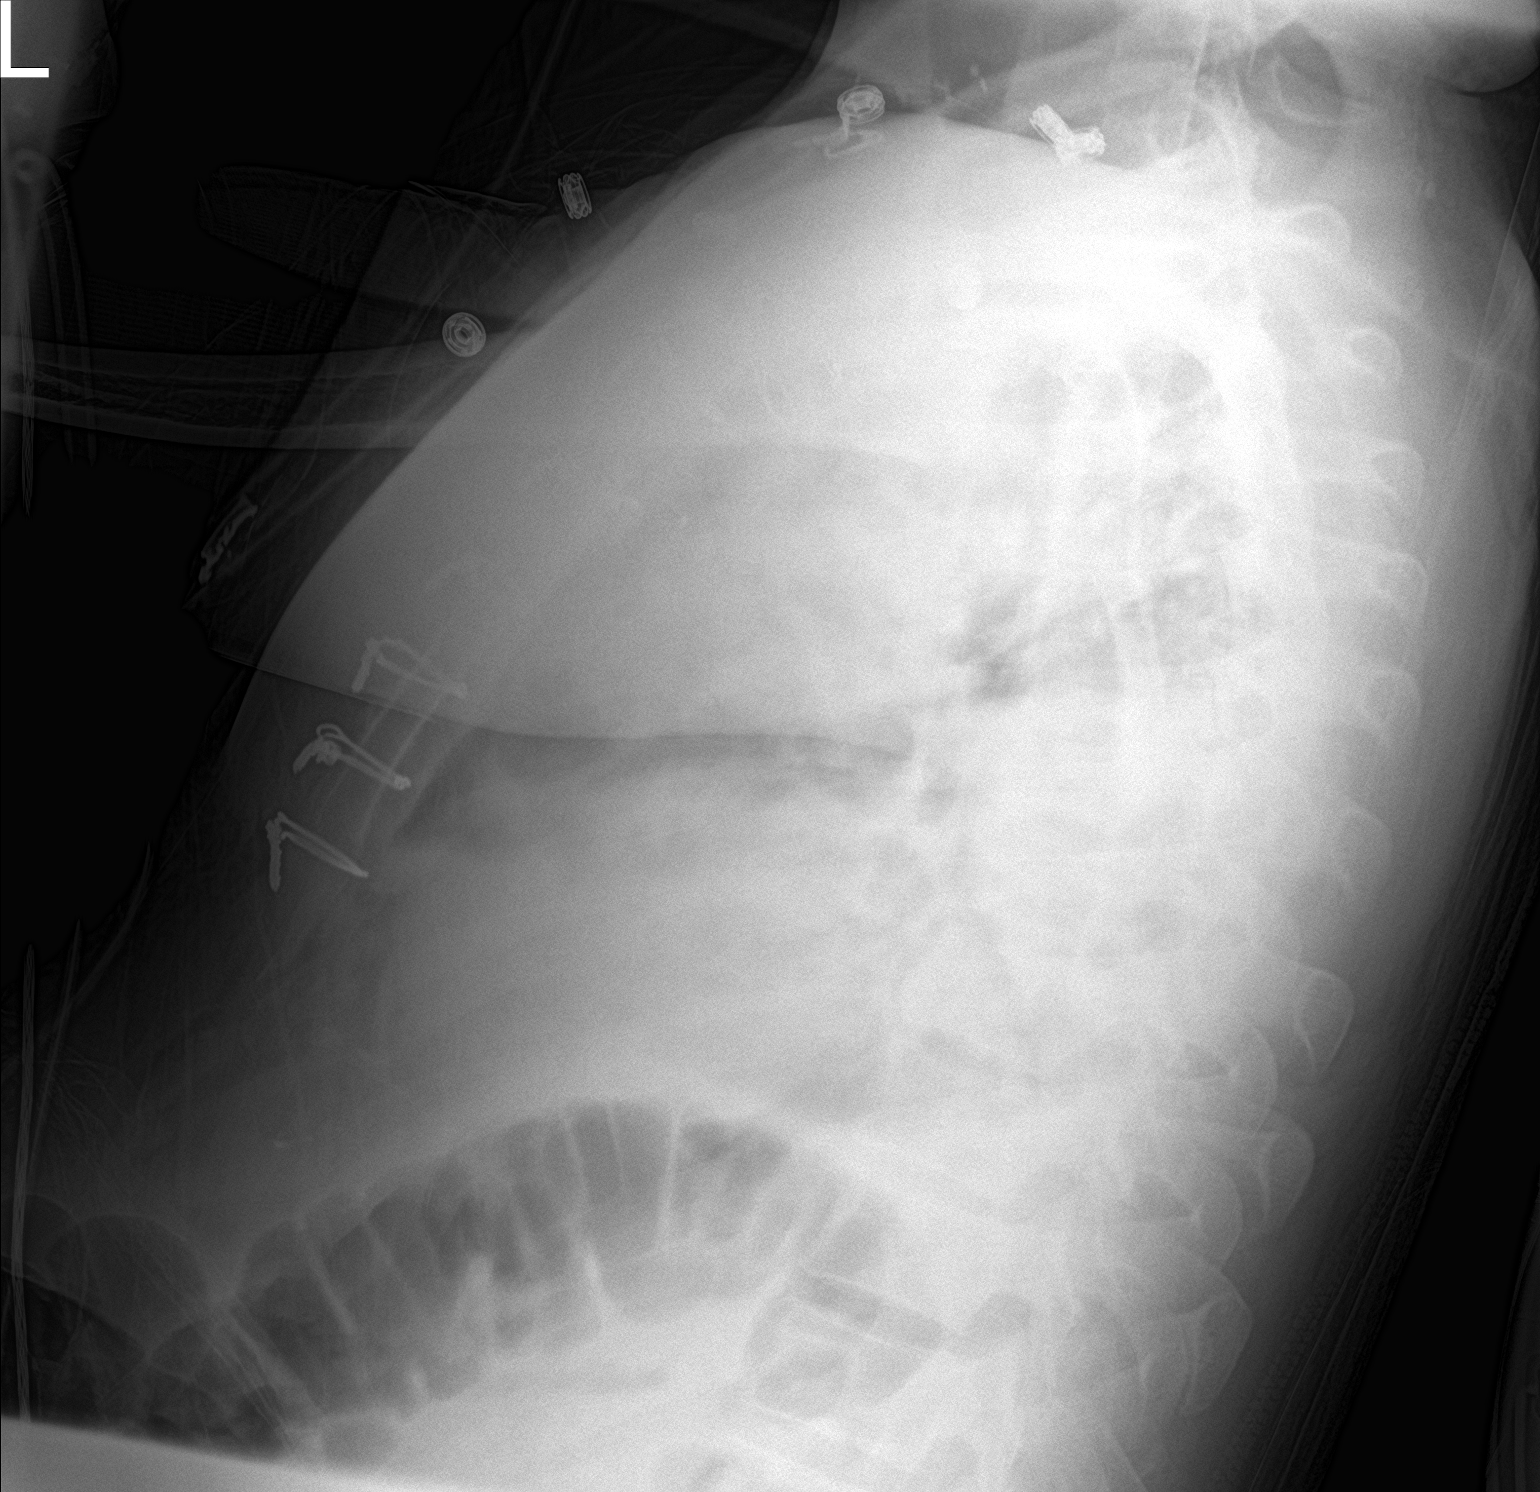

[chest ap]
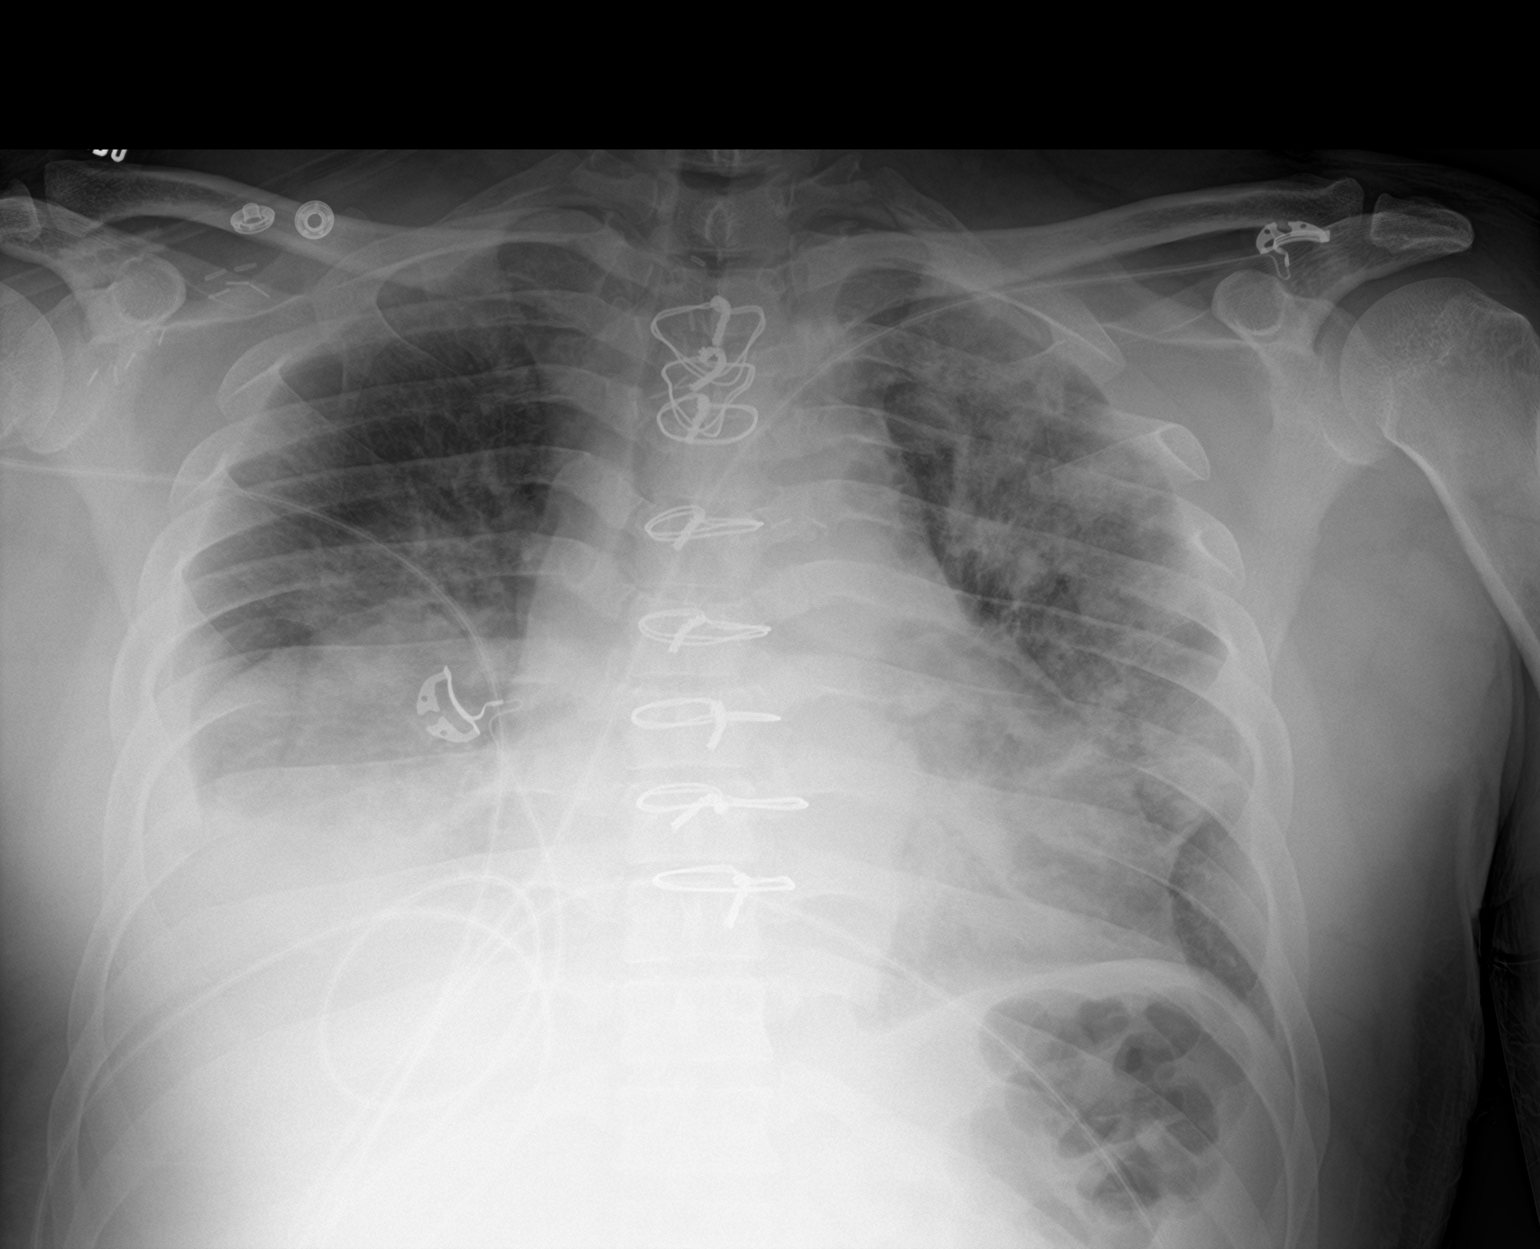

[2 of 2 positions shown; findings below may reference images not displayed]

FINDINGS: Heart is enlarged. Interstitial and airspace disease has progressed
bilaterally. A right pleural effusion is present.
IMPRESSION: 1. Progressive right lower lobe and left upper lobe airspace disease
concerning for pneumonia. Atelectasis is considered less likely.
2. Prominent right pleural effusion.

## 2020-01-19 ENCOUNTER — Other Ambulatory Visit: Payer: Self-pay | Admitting: Cardiology

## 2020-01-19 DIAGNOSIS — I10 Essential (primary) hypertension: Secondary | ICD-10-CM

## 2020-01-19 DIAGNOSIS — Z8679 Personal history of other diseases of the circulatory system: Secondary | ICD-10-CM

## 2020-01-19 DIAGNOSIS — Z9889 Other specified postprocedural states: Secondary | ICD-10-CM

## 2020-03-03 ENCOUNTER — Ambulatory Visit (INDEPENDENT_AMBULATORY_CARE_PROVIDER_SITE_OTHER): Payer: Self-pay | Admitting: Nurse Practitioner

## 2020-03-03 ENCOUNTER — Encounter: Payer: Self-pay | Admitting: Nurse Practitioner

## 2020-03-03 ENCOUNTER — Other Ambulatory Visit: Payer: Self-pay

## 2020-03-03 VITALS — BP 187/102 | HR 88 | Temp 97.0°F | Ht 73.0 in | Wt 247.0 lb

## 2020-03-03 DIAGNOSIS — Z13220 Encounter for screening for lipoid disorders: Secondary | ICD-10-CM

## 2020-03-03 DIAGNOSIS — N529 Male erectile dysfunction, unspecified: Secondary | ICD-10-CM

## 2020-03-03 DIAGNOSIS — I10 Essential (primary) hypertension: Secondary | ICD-10-CM

## 2020-03-03 LAB — POCT URINALYSIS DIPSTICK
Bilirubin, UA: NEGATIVE
Blood, UA: NEGATIVE
Glucose, UA: NEGATIVE
Ketones, UA: NEGATIVE
Leukocytes, UA: NEGATIVE
Nitrite, UA: NEGATIVE
Protein, UA: NEGATIVE
Spec Grav, UA: >=1.03 — AB (ref 1.010–1.025)
Urobilinogen, UA: 0.2 E.U./dL
pH, UA: 6 (ref 5.0–8.0)

## 2020-03-03 MED ORDER — CLONIDINE HCL 0.1 MG PO TABS: 0.2000 mg | ORAL_TABLET | Freq: Once | ORAL | Status: DC

## 2020-03-03 MED ORDER — AMLODIPINE BESYLATE 10 MG PO TABS: 10.0000 mg | ORAL_TABLET | Freq: Every day | ORAL | 3 refills | Status: DC

## 2020-03-03 NOTE — Patient Instructions (Signed)
Managing Your Hypertension Hypertension is commonly called high blood pressure. This is when the force of your blood pressing against the walls of your arteries is too strong. Arteries are blood vessels that carry blood from your heart throughout your body. Hypertension forces the heart to work harder to pump blood, and may cause the arteries to become narrow or stiff. Having untreated or uncontrolled hypertension can cause heart attack, stroke, kidney disease, and other problems. What are blood pressure readings? A blood pressure reading consists of a higher number over a lower number. Ideally, your blood pressure should be below 120/80. The first ("top") number is called the systolic pressure. It is a measure of the pressure in your arteries as your heart beats. The second ("bottom") number is called the diastolic pressure. It is a measure of the pressure in your arteries as the heart relaxes. What does my blood pressure reading mean? Blood pressure is classified into four stages. Based on your blood pressure reading, your health care provider may use the following stages to determine what type of treatment you need, if any. Systolic pressure and diastolic pressure are measured in a unit called mm Hg. Normal  Systolic pressure: below 120.  Diastolic pressure: below 80. Elevated  Systolic pressure: 120-129.  Diastolic pressure: below 80. Hypertension stage 1  Systolic pressure: 130-139.  Diastolic pressure: 80-89. Hypertension stage 2  Systolic pressure: 140 or above.  Diastolic pressure: 90 or above. What health risks are associated with hypertension? Managing your hypertension is an important responsibility. Uncontrolled hypertension can lead to:  A heart attack.  A stroke.  A weakened blood vessel (aneurysm).  Heart failure.  Kidney damage.  Eye damage.  Metabolic syndrome.  Memory and concentration problems. What changes can I make to manage my  hypertension? Hypertension can be managed by making lifestyle changes and possibly by taking medicines. Your health care provider will help you make a plan to bring your blood pressure within a normal range. Eating and drinking   Eat a diet that is high in fiber and potassium, and low in salt (sodium), added sugar, and fat. An example eating plan is called the DASH (Dietary Approaches to Stop Hypertension) diet. To eat this way: ? Eat plenty of fresh fruits and vegetables. Try to fill half of your plate at each meal with fruits and vegetables. ? Eat whole grains, such as whole wheat pasta, brown rice, or whole grain bread. Fill about one quarter of your plate with whole grains. ? Eat low-fat diary products. ? Avoid fatty cuts of meat, processed or cured meats, and poultry with skin. Fill about one quarter of your plate with lean proteins such as fish, chicken without skin, beans, eggs, and tofu. ? Avoid premade and processed foods. These tend to be higher in sodium, added sugar, and fat.  Reduce your daily sodium intake. Most people with hypertension should eat less than 1,500 mg of sodium a day.  Limit alcohol intake to no more than 1 drink a day for nonpregnant women and 2 drinks a day for men. One drink equals 12 oz of beer, 5 oz of wine, or 1 oz of hard liquor. Lifestyle  Work with your health care provider to maintain a healthy body weight, or to lose weight. Ask what an ideal weight is for you.  Get at least 30 minutes of exercise that causes your heart to beat faster (aerobic exercise) most days of the week. Activities may include walking, swimming, or biking.  Include exercise   to strengthen your muscles (resistance exercise), such as weight lifting, as part of your weekly exercise routine. Try to do these types of exercises for 30 minutes at least 3 days a week.  Do not use any products that contain nicotine or tobacco, such as cigarettes and e-cigarettes. If you need help quitting,  ask your health care provider.  Control any long-term (chronic) conditions you have, such as high cholesterol or diabetes. Monitoring  Monitor your blood pressure at home as told by your health care provider. Your personal target blood pressure may vary depending on your medical conditions, your age, and other factors.  Have your blood pressure checked regularly, as often as told by your health care provider. Working with your health care provider  Review all the medicines you take with your health care provider because there may be side effects or interactions.  Talk with your health care provider about your diet, exercise habits, and other lifestyle factors that may be contributing to hypertension.  Visit your health care provider regularly. Your health care provider can help you create and adjust your plan for managing hypertension. Will I need medicine to control my blood pressure? Your health care provider may prescribe medicine if lifestyle changes are not enough to get your blood pressure under control, and if:  Your systolic blood pressure is 130 or higher.  Your diastolic blood pressure is 80 or higher. Take medicines only as told by your health care provider. Follow the directions carefully. Blood pressure medicines must be taken as prescribed. The medicine does not work as well when you skip doses. Skipping doses also puts you at risk for problems. Contact a health care provider if:  You think you are having a reaction to medicines you have taken.  You have repeated (recurrent) headaches.  You feel dizzy.  You have swelling in your ankles.  You have trouble with your vision. Get help right away if:  You develop a severe headache or confusion.  You have unusual weakness or numbness, or you feel faint.  You have severe pain in your chest or abdomen.  You vomit repeatedly.  You have trouble breathing. Summary  Hypertension is when the force of blood pumping  through your arteries is too strong. If this condition is not controlled, it may put you at risk for serious complications.  Your personal target blood pressure may vary depending on your medical conditions, your age, and other factors. For most people, a normal blood pressure is less than 120/80.  Hypertension is managed by lifestyle changes, medicines, or both. Lifestyle changes include weight loss, eating a healthy, low-sodium diet, exercising more, and limiting alcohol. This information is not intended to replace advice given to you by your health care provider. Make sure you discuss any questions you have with your health care provider. Document Revised: 09/04/2018 Document Reviewed: 04/10/2016 Elsevier Patient Education  2020 [employer]  Erectile Dysfunction Erectile dysfunction (ED) is the inability to get or keep an erection in order to have sexual intercourse. Erectile dysfunction may include:  Inability to get an erection.  Lack of enough hardness of the erection to allow penetration.  Loss of the erection before sex is finished. What are the causes? This condition may be caused by:  Certain medicines, such as: ? Pain relievers. ? Antihistamines. ? Antidepressants. ? Blood pressure medicines. ? Water pills (diuretics). ? Ulcer medicines. ? Muscle relaxants. ? Drugs.  Excessive drinking.  Psychological causes, such as: ? Anxiety. ? Depression. ? Sadness. ?   Exhaustion. ? Performance fear. ? Stress.  Physical causes, such as: ? Artery problems. This may include diabetes, smoking, liver disease, or atherosclerosis. ? High blood pressure. ? Hormonal problems, such as low testosterone. ? Obesity. ? Nerve problems. This may include back or pelvic injuries, diabetes mellitus, multiple sclerosis, or Parkinson disease. What are the signs or symptoms? Symptoms of this condition include:  Inability to get an erection.  Lack of enough hardness of the erection to  allow penetration.  Loss of the erection before sex is finished.  Normal erections at some times, but with frequent unsatisfactory episodes.  Low sexual satisfaction in either partner due to erection problems.  A curved penis occurring with erection. The curve may cause pain or the penis may be too curved to allow for intercourse.  Never having nighttime erections. How is this diagnosed? This condition is often diagnosed by:  Performing a physical exam to find other diseases or specific problems with the penis.  Asking you detailed questions about the problem.  Performing blood tests to check for diabetes mellitus or to measure hormone levels.  Performing other tests to check for underlying health conditions.  Performing an ultrasound exam to check for scarring.  Performing a test to check blood flow to the penis.  Doing a sleep study at home to measure nighttime erections. How is this treated? This condition may be treated by:  Medicine taken by mouth to help you achieve an erection (oral medicine).  Hormone replacement therapy to replace low testosterone levels.  Medicine that is injected into the penis. Your health care provider may instruct you how to give yourself these injections at home.  Vacuum pump. This is a pump with a ring on it. The pump and ring are placed on the penis and used to create pressure that helps the penis become erect.  Penile implant surgery. In this procedure, you may receive: ? An inflatable implant. This consists of cylinders, a pump, and a reservoir. The cylinders can be inflated with a fluid that helps to create an erection, and they can be deflated after intercourse. ? A semi-rigid implant. This consists of two silicone rubber rods. The rods provide some rigidity. They are also flexible, so the penis can both curve downward in its normal position and become straight for sexual intercourse.  Blood vessel surgery, to improve blood flow to the  penis. During this procedure, a blood vessel from a different part of the body is placed into the penis to allow blood to flow around (bypass) damaged or blocked blood vessels.  Lifestyle changes, such as exercising more, losing weight, and quitting smoking. Follow these instructions at home: Medicines   Take over-the-counter and prescription medicines only as told by your health care provider. Do not increase the dosage without first discussing it with your health care provider.  If you are using self-injections, perform injections as directed by your health care provider. Make sure to avoid any veins that are on the surface of the penis. After giving an injection, apply pressure to the injection site for 5 minutes. General instructions  Exercise regularly, as directed by your health care provider. Work with your health care provider to lose weight, if needed.  Do not use any products that contain nicotine or tobacco, such as cigarettes and e-cigarettes. If you need help quitting, ask your health care provider.  Before using a vacuum pump, read the instructions that come with the pump and discuss any questions with your health care provider.    Keep all follow-up visits as told by your health care provider. This is important. Contact a health care provider if:  You feel nauseous.  You vomit. Get help right away if:  You are taking oral or injectable medicines and you have an erection that lasts longer than 4 hours. If your health care provider is unavailable, go to the nearest emergency room for evaluation. An erection that lasts much longer than 4 hours can result in permanent damage to your penis.  You have severe pain in your groin or abdomen.  You develop redness or severe swelling of your penis.  You have redness spreading up into your groin or lower abdomen.  You are unable to urinate. You experience chest pain or a rapid heart beat (palpitations) after taking oral  medicines. Summary  Erectile dysfunction (ED) is the inability to get or keep an erection during sexual intercourse. This problem can usually be treated successfully.  This condition is diagnosed based on a physical exam, your symptoms, and tests to determine the cause. Treatment varies depending on the cause, and may include medicines, hormone therapy, surgery, or vacuum pump.  You may need follow-up visits to make sure that you are using your medicines or devices correctly.  Get help right away if you are taking or injecting medicines and you have an erection that lasts longer than 4 hours. This information is not intended to replace advice given to you by your health care provider. Make sure you discuss any questions you have with your health care provider. Document Revised: 04/25/2017 Document Reviewed: 05/29/2016 Elsevier Patient Education  2020 Elsevier Inc.  

## 2020-03-03 NOTE — Progress Notes (Signed)
[facility] [address] [place], [place]  [address] Phone:  [phone]   Fax:  [phone]   Established Patient Office Visit  Subjective:  Patient ID: Derrick Clayton, male    DOB: 1981/03/16  Age: 39 y.o. MRN: [id number]  CC:  Chief Complaint  Patient presents with  . Follow-up    having ED problems wants to talked about medication    HPI Derrick Clayton presents for follow up. He  has a past medical history of Acute thoracic aortic dissection (HCC) (03/10/2018), Hypertension, and S/P aortic dissection repair (03/11/2018).   Hypertension Patient is here for follow-up of elevated blood pressure. He is not exercising and is not adherent to a low-salt diet. He is not watching his diet. He has gained wt. He likes to eat. . Cardiac symptoms: none. Patient denies chest pain, dyspnea, fatigue, irregular heart beat, palpitations and syncope. Cardiovascular risk factors: hypertension, male gender, obesity (BMI >= 30 kg/m2) and sedentary lifestyle. Use of agents associated with hypertension: none. History of target organ damage:S/P repair ascending aortic dissection and History of paroxysmal atrial tachycardia    He Is here for evaluation and treatment of erectile dysfunction. Onset of dysfunction was a few weeks ago and onset was gradual. Patient states he has difficulty attaining erection. Full erections occur with intercourse. Partial erections occur never. Libido is affected. Risk factors for ED include antihypertensive medications: lisinopil, metoprolol and amlodpine . Patient's expectations of sexual function is to be natural. Patient describes relationship with partner as somewhat affected.    Past Medical History:  Diagnosis Date  . Acute thoracic aortic dissection (HCC) 03/10/2018  . Hypertension   . 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 AORTIC INTERVENTION N/A 03/10/2018   Procedure: RESUSPENSION OF THE NATIVE AORTIC VALVE;  Surgeon: Purcell Nails, MD;  Location: [facility] OR;  Service: Open Heart Surgery;  Laterality: N/A;  . AORTIC VALVE REPAIR N/A 03/10/2018   Procedure: REPAIR OF ASCENDING AORTIC DISECTION USING STRAIGHT HEMASHIELD PLATINUM VASCULAR GRAFT; OPEN HEMIARCH DISTAL ANASTOMOSIS;  Surgeon: Purcell Nails, MD;  Location: [facility] OR;  Service: Open Heart Surgery;  Laterality: N/A;  . FOOT SURGERY      Family History  Problem Relation Age of Onset  . Cancer Mother   . Heart failure Father     Social History   Socioeconomic History  . Marital status: Single    Spouse name: Not on file  . Number of children: Not on file  . Years of education: Not on file  . Highest education level: Not on file  Occupational History  . Not on file  Tobacco Use  . Smoking status: Former Smoker    Packs/day: 0.00    Types: Cigarettes  . Smokeless tobacco: Never Used  Vaping Use  . Vaping Use: Never used  Substance and Sexual Activity  . Alcohol use: Not Currently    Comment: 3-4 days a week  . Drug use: Yes    Types: Marijuana    Comment: daily  . Sexual activity: Not on file  Other Topics Concern  . Not on file  Social History Narrative  . Not on file   Social Determinants of Health   Financial Resource Strain:   . Difficulty of Paying Living Expenses: Not on file  Food  Insecurity:   . Worried About Programme researcher, broadcasting/film/video in the Last Year: Not on file  . Ran Out of Food in the Last Year: Not on file  Transportation Needs:   . Lack of Transportation (Medical): Not on file  . Lack of Transportation (Non-Medical): Not on file  Physical Activity:   . Days of Exercise per Week: Not on file  . Minutes of Exercise per Session: Not on file  Stress:   . Feeling of Stress : Not on file  Social Connections:   . Frequency of Communication with  Friends and Family: Not on file  . Frequency of Social Gatherings with Friends and Family: Not on file  . Attends Religious Services: Not on file  . Active Member of Clubs or Organizations: Not on file  . Attends Banker Meetings: Not on file  . Marital Status: Not on file  Intimate Partner Violence:   . Fear of Current or Ex-Partner: Not on file  . Emotionally Abused: Not on file  . Physically Abused: Not on file  . Sexually Abused: Not on file    Outpatient Medications Prior to Visit  Medication Sig Dispense Refill  . aspirin EC 81 MG tablet Take 81 mg by mouth daily.    Marland Kitchen ibuprofen (ADVIL) 800 MG tablet Take 1 tablet (800 mg total) by mouth 3 (three) times daily. 21 tablet 0  . lisinopril (ZESTRIL) 20 MG tablet Take 1 tablet by mouth once daily 30 tablet 4  . metoprolol (TOPROL-XL) 200 MG 24 hr tablet TAKE 1 TABLET BY MOUTH ONCE DAILY. TAKE WITH OR IMMEDIATELY FOLLOWING A MEAL 30 tablet 4  . amLODipine (NORVASC) 5 MG tablet Take 1 tablet (5 mg total) by mouth daily. 90 tablet 3  . amoxicillin-clavulanate (AUGMENTIN) 875-125 MG tablet Take 1 tablet by mouth every 12 (twelve) hours. (Patient not taking: Reported on 10/04/2019) 14 tablet 0   No facility-administered medications prior to visit.    No Known Allergies  ROS Review of Systems  Respiratory: Negative for chest tightness and shortness of breath.        Has problems with mask and wt  Cardiovascular: Negative for chest pain, palpitations and leg swelling.       Denies irregular  Neurological: Negative for dizziness and headaches.      Objective:    Physical Exam Constitutional:      General: He is not in acute distress.    Appearance: He is obese. He is not toxic-appearing.  HENT:     Head: Normocephalic and atraumatic.     Nose: Nose normal.     Mouth/Throat:     Mouth: Mucous membranes are moist.  Cardiovascular:     Rate and Rhythm: Normal rate.     Pulses: Normal pulses.  Pulmonary:      Effort: Pulmonary effort is normal.     Breath sounds: Normal breath sounds.  Abdominal:     Palpations: Abdomen is soft.  Musculoskeletal:        General: Normal range of motion.     Cervical back: Normal range of motion.  Skin:    General: Skin is warm and dry.     Capillary Refill: Capillary refill takes less than 2 seconds.  Neurological:     General: No focal deficit present.     Mental Status: He is alert and oriented to person, place, and time.  Psychiatric:        Mood and Affect: Mood normal.  Behavior: Behavior normal.        Thought Content: Thought content normal.        Judgment: Judgment normal.     BP (!) 187/102 (BP Location: Right Arm, Patient Position: Sitting, Cuff Size: Normal)   Pulse 88   Temp (!) 97 F (36.1 C)   Ht 6\' 1"  (1.854 m)   Wt 247 lb (112 kg)   SpO2 98%   BMI 32.59 kg/m  Wt Readings from Last 3 Encounters:  03/03/20 247 lb (112 kg)  10/04/19 239 lb 12.8 oz (108.8 kg)  06/23/19 224 lb (101.6 kg)     There are no preventive care reminders to display for this patient.  There are no preventive care reminders to display for this patient.  No results found for: TSH Lab Results  Component Value Date   WBC 5.5 10/05/2019   HGB 16.0 10/05/2019   HCT 45.7 10/05/2019   MCV 87 10/05/2019   PLT 316 10/05/2019   Lab Results  Component Value Date   NA 138 10/05/2019   K 4.4 10/05/2019   CO2 22 03/03/2019   GLUCOSE 110 (H) 10/05/2019   BUN 15 10/05/2019   CREATININE 1.27 10/05/2019   BILITOT 0.5 10/05/2019   ALKPHOS 70 10/05/2019   AST 27 10/05/2019   ALT 40 03/10/2018   PROT 7.4 10/05/2019   ALBUMIN 4.9 10/05/2019   CALCIUM 10.1 10/05/2019   ANIONGAP 8 03/23/2018   Lab Results  Component Value Date   CHOL 168 07/02/2018   Lab Results  Component Value Date   HDL 51 07/02/2018   Lab Results  Component Value Date   LDLCALC 104 (H) 07/02/2018   Lab Results  Component Value Date   TRIG 66 07/02/2018   Lab Results    Component Value Date   CHOLHDL 3.3 07/02/2018   Lab Results  Component Value Date   HGBA1C 5.3 03/23/2018      Assessment & Plan:   Problem List Items Addressed This Visit      Cardiovascular and Mediastinum   Uncontrolled hypertension   Relevant Medications   amLODipine (NORVASC) 10 MG tablet   Essential hypertension - Primary (Chronic) Encouraged on going compliance with current medication regimen Encouraged home monitoring and recording BP <130/80 Eating a heart-healthy diet with less salt Encouraged regular physical activity  Recommend Weight loss   Relevant Medications   amLODipine (NORVASC) 10 MG tablet   Other Relevant Orders   Comp. Metabolic Panel (12)   POCT urinalysis dipstick    Other Visit Diagnoses    Screening cholesterol level       Relevant Orders   Lipid panel   Erectile dysfunction, unspecified erectile dysfunction type     Will evaluate PSA and UA. Encouraged pt to follow up with cardiology and have him evaluate if  ED treatment is appropriate     Relevant Orders   PSA      Meds ordered this encounter  Medications  . amLODipine (NORVASC) 10 MG tablet    Sig: Take 1 tablet (10 mg total) by mouth daily.    Dispense:  90 tablet    Refill:  3    Order Specific Question:   Supervising Provider    Answer:   03/25/2018 Quentin Angst  . cloNIDine (CATAPRES) tablet 0.2 mg    Follow-up: Return in about 3 months (around 06/03/2020).    08/01/2020, NP

## 2020-03-04 LAB — COMP. METABOLIC PANEL (12)
AST: 24 IU/L (ref 0–40)
Albumin/Globulin Ratio: 1.7 (ref 1.2–2.2)
Albumin: 4.5 g/dL (ref 4.0–5.0)
Alkaline Phosphatase: 65 IU/L (ref 44–121)
BUN/Creatinine Ratio: 12 (ref 9–20)
BUN: 14 mg/dL (ref 6–24)
Bilirubin Total: 0.2 mg/dL (ref 0.0–1.2)
Calcium: 9.3 mg/dL (ref 8.7–10.2)
Chloride: 103 mmol/L (ref 96–106)
Creatinine, Ser: 1.14 mg/dL (ref 0.76–1.27)
GFR calc Af Amer: 92 mL/min/{1.73_m2} (ref >59)
GFR calc non Af Amer: 80 mL/min/{1.73_m2} (ref >59)
Globulin, Total: 2.7 g/dL (ref 1.5–4.5)
Glucose: 105 mg/dL — ABNORMAL HIGH (ref 65–99)
Potassium: 4.4 mmol/L (ref 3.5–5.2)
Sodium: 138 mmol/L (ref 134–144)
Total Protein: 7.2 g/dL (ref 6.0–8.5)

## 2020-03-04 LAB — LIPID PANEL
Chol/HDL Ratio: 4.4 ratio (ref 0.0–5.0)
Cholesterol, Total: 188 mg/dL (ref 100–199)
HDL: 43 mg/dL (ref >39)
LDL Chol Calc (NIH): 121 mg/dL — ABNORMAL HIGH (ref 0–99)
Triglycerides: 135 mg/dL (ref 0–149)
VLDL Cholesterol Cal: 24 mg/dL (ref 5–40)

## 2020-03-04 LAB — PSA: Prostate Specific Ag, Serum: 1.5 ng/mL (ref 0.0–4.0)

## 2020-03-13 ENCOUNTER — Other Ambulatory Visit: Payer: Self-pay

## 2020-03-13 ENCOUNTER — Ambulatory Visit (INDEPENDENT_AMBULATORY_CARE_PROVIDER_SITE_OTHER): Payer: 59 | Admitting: Cardiology

## 2020-03-13 ENCOUNTER — Encounter: Payer: Self-pay | Admitting: Cardiology

## 2020-03-13 VITALS — BP 146/86 | HR 61 | Ht 73.0 in | Wt 249.9 lb

## 2020-03-13 DIAGNOSIS — I10 Essential (primary) hypertension: Secondary | ICD-10-CM

## 2020-03-13 DIAGNOSIS — Z716 Tobacco abuse counseling: Secondary | ICD-10-CM

## 2020-03-13 DIAGNOSIS — N529 Male erectile dysfunction, unspecified: Secondary | ICD-10-CM

## 2020-03-13 DIAGNOSIS — Z9889 Other specified postprocedural states: Secondary | ICD-10-CM

## 2020-03-13 DIAGNOSIS — Z8679 Personal history of other diseases of the circulatory system: Secondary | ICD-10-CM | POA: Diagnosis not present

## 2020-03-13 DIAGNOSIS — Z7189 Other specified counseling: Secondary | ICD-10-CM

## 2020-03-13 NOTE — Progress Notes (Signed)
Cardiology Office Note:    Date:  03/13/2020   ID:  [name], DOB Nov 04, 1980, MRN [id number]  PCP:  Barbette Merino, NP  Cardiologist:  Jodelle Red, MD  Referring MD: Barbette Merino, NP   Chief Complaint:  Follow up  History of Present Illness:    Derrick Clayton is a 39 y.o. male with a hx of hypertension, acute ascending aortic dissection s/p repair 02/2018.  Cardiac history: He presented to the ER 02/2018 with chest pain and hypertensive emergency. He had severe chest and foot pain, and he was found to have an ascending aortic dissection and no doppler flow to the left foot. Dissection also involved right carotid, celiac axis, SMA and bilateral iliac arteries. He was emergently taken for REPAIR OF ASCENDING AORTIC DISECTION USING STRAIGHT HEMASHIELD PLATINUM VASCULAR GRAFT; OPEN HEMIARCH DISTAL ANASTOMOSIS AND RESUSPENSION OF THE NATIVE AORTIC VALVE. His hospitalization was notable for possible atrial tachycardia vs. Flutter, but risk was considered to outweigh benefit of anticoagulation. He presented to the ER on 03/23/18 for peripheral leg edema and had lasix increased for a week.   Today: Blood pressure still over goal. Reports taking medication as ordered. Has gained weight, has not been active at all. Recently saw PCP, amlodipine increased to 10 mg. Has follow up in January to monitor BP.   Doesn't take home BP, but does have a cuff. We discussed his concerns re: erectile dysfunction. Discussed medication options to treat erectile dysfunction. Did discuss risk of hypotension if he were to receive nitro via EMS.   Wants to get back to the gym. Discussed cardiovascular exercise, avoid heavy lifting/straining given his history.   Eating more, has been sedentary, not working. Has worked on tobacco cessation, has cut back to light cigarettes, finishes about 5 cigarettes/day.   Denies chest pain, shortness of breath at rest or with normal exertion. No PND, orthopnea, LE  edema or unexpected weight gain. No syncope or palpitations.  Past Medical History:  Diagnosis Date  . Acute thoracic aortic dissection (HCC) 03/10/2018  . Hypertension   . S/P aortic dissection repair 03/11/2018   Straight graft replacement of ascending thoracic aorta with resuspension of native aortic valve and open hemi-arch distal anastomosis   Past Surgical History:  Procedure Laterality Date  . AORTIC INTERVENTION N/A 03/10/2018   Procedure: RESUSPENSION OF THE NATIVE AORTIC VALVE;  Surgeon: Purcell Nails, MD;  Location: [facility] OR;  Service: Open Heart Surgery;  Laterality: N/A;  . AORTIC VALVE REPAIR N/A 03/10/2018   Procedure: REPAIR OF ASCENDING AORTIC DISECTION USING STRAIGHT HEMASHIELD PLATINUM VASCULAR GRAFT; OPEN HEMIARCH DISTAL ANASTOMOSIS;  Surgeon: Purcell Nails, MD;  Location: [facility] OR;  Service: Open Heart Surgery;  Laterality: N/A;  . FOOT SURGERY       Current Meds  Medication Sig  . amLODipine (NORVASC) 10 MG tablet Take 1 tablet (10 mg total) by mouth daily.  Marland Kitchen aspirin EC 81 MG tablet Take 81 mg by mouth daily.  Marland Kitchen ibuprofen (ADVIL) 800 MG tablet Take 1 tablet (800 mg total) by mouth 3 (three) times daily.  Marland Kitchen lisinopril (ZESTRIL) 20 MG tablet Take 1 tablet by mouth once daily  . metoprolol (TOPROL-XL) 200 MG 24 hr tablet TAKE 1 TABLET BY MOUTH ONCE DAILY. TAKE WITH OR IMMEDIATELY FOLLOWING A MEAL   Current Facility-Administered Medications for the 03/13/20 encounter (Office Visit) with Jodelle Red, MD  Medication  . cloNIDine (CATAPRES) tablet 0.2 mg     Allergies:   Patient  has no known allergies.   Social History   Tobacco Use  . Smoking status: Former Smoker    Packs/day: 0.00    Types: Cigarettes  . Smokeless tobacco: Never Used  Vaping Use  . Vaping Use: Never used  Substance Use Topics  . Alcohol use: Not Currently    Comment: 3-4 days a week  . Drug use: Yes    Types: Marijuana    Comment: daily     Family Hx: The patient's  family history includes Cancer in his mother; Heart failure in his father.  ROS:   Please see the history of present illness.    All other systems reviewed and are negative.   Prior CV studies:   The following studies were reviewed today: CTA 04-05-2019 with stable dissection measurements, stable post op measurements  Labs/Other Tests and Data Reviewed:    EKG:  An ECG dated today was personally reviewed today and demonstrated:  NSR at 61 bpm  Recent Labs: 10/05/2019: Hemoglobin 16.0; Platelets 316 03/03/2020: BUN 14; Creatinine, Ser 1.14; Potassium 4.4; Sodium 138   Recent Lipid Panel Lab Results  Component Value Date/Time   CHOL 188 03/03/2020 08:59 AM   TRIG 135 03/03/2020 08:59 AM   HDL 43 03/03/2020 08:59 AM   CHOLHDL 4.4 03/03/2020 08:59 AM   LDLCALC 121 (H) 03/03/2020 08:59 AM    Wt Readings from Last 3 Encounters:  03/13/20 249 lb 14.4 oz (113.4 kg)  03/03/20 247 lb (112 kg)  10/04/19 239 lb 12.8 oz (108.8 kg)     Objective:    Vital Signs:  BP (!) 146/86   Pulse 61   Ht 6\' 1"  (1.854 m)   Wt 249 lb 14.4 oz (113.4 kg)   SpO2 98%   BMI 32.97 kg/m    GEN: Well nourished, well developed in no acute distress HEENT: Normal, moist mucous membranes NECK: No JVD CARDIAC: regular rhythm, normal S1 and S2, no rubs or gallops. No murmur. VASCULAR: Radial and DP pulses 2+ bilaterally. No carotid bruits RESPIRATORY:  Clear to auscultation without rales, wheezing or rhonchi  ABDOMEN: Soft, non-tender, non-distended MUSCULOSKELETAL:  Ambulates independently SKIN: Warm and dry, no edema NEUROLOGIC:  Alert and oriented x 3. No focal neuro deficits noted. PSYCHIATRIC:  Normal affect   ASSESSMENT & PLAN:    Type A aortic dissection, s/p repair: we again discussed the importance of tight control of blood pressure.  -we discussed options for management. Just had amlodipine increased. Feels that he might miss meds by switching to twice a day (ie carvedilol) -continue  metoprolol succinate 200 mg daily.  -on lisinopril 20 mg daily, if remains elevated would increase -on amlodipine 10 mg -see counseling on smoking, aspirin, statin as below  Hypertension: elevated today but improved from recent visit -we discussed how lifestyle can impact his blood pressure as well. He wants to avoid more medications. Discussed diet, exercise, tobacco cessation as ways to manage BP in addition to medications -he is not currently checking BP but has a cuff at home. He will being to check and contact me with concerns -lisinopril as above -metoprolol as above -amlodipine as above  Erectile dysfunction: -he is not currently on a nitrate, and he can trial a PDE5i inhibitor with his PCP to see if this helps -we did discuss the risk of nitroglycerin and PDE5i. Reviewed that he should tell EMS/ER last dose of ED medication if he is seen urgently to avoid a precipitous drop in blood pressure  Tobacco  cessation: The patient was counseled on tobacco cessation today for 4 minutes.  Counseling included reviewing the risks of smoking tobacco products, how it impacts the patient's current medical diagnoses and different strategies for quitting.  Pharmacotherapy to aid in tobacco cessation was not prescribed today.  CV risk counseling and prevention: -recommend heart healthy/Mediterranean diet, with whole grains, fruits, vegetable, fish, lean meats, nuts, and olive oil. Limit salt. -recommend moderate walking, 3-5 times/week for 30-50 minutes each session. Aim for at least 150 minutes.week. Goal should be pace of 3 miles/hours, or walking 1.5 miles in 30 minutes -recommend avoidance of tobacco products. Avoid excess alcohol. -ASCVD risk score: The 10-year ASCVD risk score Denman George DC Jr., et al., 2013) is: 6.8%   Values used to calculate the score:     Age: 77 years     Sex: Male     Is Non-Hispanic African American: Yes     Diabetic: No     Tobacco smoker: No     Systolic Blood  Pressure: 146 mmHg     Is BP treated: Yes     HDL Cholesterol: 43 mg/dL     Total Cholesterol: 188 mg/dL  Taking aspirin 81 mg We have discussed statins on multiple occasions, declines at this time  Follow up: he will see his PCP in 3 mos, we will follow up in 6 mos.  Orders Placed This Encounter  Procedures  . EKG 12-Lead    Patient Instructions  Medication Instructions:  Your Physician recommend you continue on your current medication as directed.    *If you need a refill on your cardiac medications before your next appointment, please call your pharmacy*   Lab Work: None ordered   Testing/Procedures: None ordered    Follow-Up: At [facility], you and your health needs are our priority.  As part of our continuing mission to provide you with exceptional heart care, we have created designated Provider Care Teams.  These Care Teams include your primary Cardiologist (physician) and Advanced Practice Providers (APPs -  Physician Assistants and Nurse Practitioners) who all work together to provide you with the care you need, when you need it.  We recommend signing up for the patient portal called "MyChart".  Sign up information is provided on this After Visit Summary.  MyChart is used to connect with patients for Virtual Visits (Telemedicine).  Patients are able to view lab/test results, encounter notes, upcoming appointments, etc.  Non-urgent messages can be sent to your provider as well.   To learn more about what you can do with MyChart, go to [email or web address].    Your next appointment:   6 month(s)  The format for your next appointment:   In Person  Provider:   Jodelle Red, MD   how to check blood pressure:  -sit comfortably in a chair, feet uncrossed and flat on floor, for 5-10 minutes  -arm ideally should rest at the level of the heart. However, arm should be relaxed and not tense (for example, do not hold the arm up unsupported)  -avoid  exercise, caffeine, and tobacco for at least 30 minutes prior to BP reading  -don't take BP cuff reading over clothes (always place on skin directly)  -I prefer to know how well the medication is working, so I would like you to take your readings 1-2 hours after taking your blood pressure medication if possible   Signed, Jodelle Red, MD  03/13/2020 12:28 PM    [facility]

## 2020-03-13 NOTE — Patient Instructions (Addendum)
Medication Instructions:  Your Physician recommend you continue on your current medication as directed.    *If you need a refill on your cardiac medications before your next appointment, please call your pharmacy*   Lab Work: None ordered   Testing/Procedures: None ordered    Follow-Up: At [facility], you and your health needs are our priority.  As part of our continuing mission to provide you with exceptional heart care, we have created designated Provider Care Teams.  These Care Teams include your primary Cardiologist (physician) and Advanced Practice Providers (APPs -  Physician Assistants and Nurse Practitioners) who all work together to provide you with the care you need, when you need it.  We recommend signing up for the patient portal called "MyChart".  Sign up information is provided on this After Visit Summary.  MyChart is used to connect with patients for Virtual Visits (Telemedicine).  Patients are able to view lab/test results, encounter notes, upcoming appointments, etc.  Non-urgent messages can be sent to your provider as well.   To learn more about what you can do with MyChart, go to [email or web address].    Your next appointment:   6 month(s)  The format for your next appointment:   In Person  Provider:   Jodelle Red, MD   how to check blood pressure:  -sit comfortably in a chair, feet uncrossed and flat on floor, for 5-10 minutes  -arm ideally should rest at the level of the heart. However, arm should be relaxed and not tense (for example, do not hold the arm up unsupported)  -avoid exercise, caffeine, and tobacco for at least 30 minutes prior to BP reading  -don't take BP cuff reading over clothes (always place on skin directly)  -I prefer to know how well the medication is working, so I would like you to take your readings 1-2 hours after taking your blood pressure medication if possible

## 2020-04-10 ENCOUNTER — Telehealth: Payer: Self-pay | Admitting: Cardiology

## 2020-04-10 NOTE — Telephone Encounter (Signed)
Spoke with patient. Patient would like Dr. Cristal Deer to call to discuss the covid vaccine. He saw on the news that the vaccine can cause heart enlargement and he already has a heart condition. He would like a letter of exception for his cruise so that he may participate as you either have to be fully vaccinated or have a letter. Will route to primary MD and nurse for review.

## 2020-04-10 NOTE — Telephone Encounter (Signed)
Patient called and stated that he is going on a cruise soon and he needs Dr. Cristal Deer to right him a letter in regards to excusing him from taking the covid vaccine due to some concerns he has about the vaccine. States that he needs the letter from provider to get on the cruise. Please call patient

## 2020-04-11 NOTE — Telephone Encounter (Signed)
Pt updated with MD's recommendations and verbalized understanding.  

## 2020-04-11 NOTE — Telephone Encounter (Signed)
I would recommend the Covid vaccine. He is actually higher risk for Covid due to his history. The risk of heart issues is very low, and he is not in the high risk group for complications. Due to this, I recommend he be vaccinated and cannot provide a letter of exemption.

## 2020-04-26 DIAGNOSIS — U071 COVID-19: Secondary | ICD-10-CM

## 2020-04-26 HISTORY — DX: COVID-19: U07.1

## 2020-06-02 ENCOUNTER — Telehealth: Payer: 59 | Admitting: Nurse Practitioner

## 2020-06-02 ENCOUNTER — Telehealth: Payer: Self-pay | Admitting: Nurse Practitioner

## 2020-06-02 ENCOUNTER — Ambulatory Visit: Payer: Self-pay | Admitting: Nurse Practitioner

## 2020-06-02 NOTE — Telephone Encounter (Signed)
done

## 2020-06-28 ENCOUNTER — Encounter: Payer: Self-pay | Admitting: Nurse Practitioner

## 2020-06-28 ENCOUNTER — Other Ambulatory Visit: Payer: Self-pay

## 2020-06-28 ENCOUNTER — Ambulatory Visit (INDEPENDENT_AMBULATORY_CARE_PROVIDER_SITE_OTHER): Payer: 59 | Admitting: Nurse Practitioner

## 2020-06-28 VITALS — BP 136/77 | HR 84 | Temp 97.3°F | Ht 73.0 in | Wt 251.2 lb

## 2020-06-28 DIAGNOSIS — Z803 Family history of malignant neoplasm of breast: Secondary | ICD-10-CM

## 2020-06-28 DIAGNOSIS — I10 Essential (primary) hypertension: Secondary | ICD-10-CM | POA: Diagnosis not present

## 2020-06-28 DIAGNOSIS — E669 Obesity, unspecified: Secondary | ICD-10-CM | POA: Diagnosis not present

## 2020-06-28 NOTE — Progress Notes (Signed)
[facility] Patient [facility] [address] [place], [place]  [address] Phone:  [phone]   Fax:  [phone]   Established Patient Office Visit  Subjective:  Patient ID: Derrick Clayton, male    DOB: 09-11-1980  Age: 40 y.o. MRN: [id number]  CC:  Chief Complaint  Patient presents with   Follow-up    3 month follow up for htn , no other concern     HPI Derrick Clayton presents for follow up. He  has a past medical history of Acute thoracic aortic dissection (HCC) (03/10/2018), COVID (04/2020), Hypertension, and S/P aortic dissection repair (03/11/2018).   Hypertension Patient is here for follow-up of elevated blood pressure. He is not exercising and is adherent to a low-salt diet. Blood pressure is not monitored at home. Cardiac symptoms: none. Patient denies chest pain, dyspnea, fatigue, irregular heart beat, lower extremity edema, near-syncope and syncope. Cardiovascular risk factors: hypertension, male gender, obesity (BMI >= 30 kg/m2) and sedentary lifestyle. Use of agents associated with hypertension: none. History of target organ damage: Hx of AAA, tachycardia .  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 and open hemi-arch distal anastomosis    Past Surgical History:  Procedure Laterality Date   AORTIC INTERVENTION N/A 03/10/2018   Procedure: RESUSPENSION OF THE NATIVE AORTIC VALVE;  Surgeon: Purcell Nails, MD;  Location: [facility] OR;  Service: Open Heart Surgery;  Laterality: N/A;   AORTIC VALVE REPAIR N/A 03/10/2018   Procedure: REPAIR OF ASCENDING AORTIC DISECTION USING STRAIGHT HEMASHIELD PLATINUM VASCULAR GRAFT; OPEN HEMIARCH DISTAL ANASTOMOSIS;  Surgeon: Purcell Nails, MD;  Location: [facility] OR;  Service: Open Heart Surgery;  Laterality: N/A;    FOOT SURGERY      Family History  Problem Relation Age of Onset   Cancer Mother    Heart failure Father     Social History   Socioeconomic History   Marital status: Single    Spouse name: Not on file   Number of children: Not on file   Years of education: Not on file   Highest education level: Not on file  Occupational History   Not on file  Tobacco Use   Smoking status: Former Smoker    Packs/day: 0.00    Types: Cigarettes   Smokeless tobacco: Never Used  Vaping Use   Vaping Use: Never used  Substance and Sexual Activity   Alcohol use: Not Currently    Comment: 3-4 days a week   Drug use: Yes    Types: Marijuana    Comment: daily   Sexual activity: Not on file  Other Topics Concern   Not on file  Social History Narrative   Not on file   Social Determinants of Health   Financial Resource Strain: Not on file  Food Insecurity: Not on file  Transportation Needs: Not on file  Physical Activity: Not on file  Stress: Not on file  Social Connections: Not on file  Intimate Partner Violence: Not on file    Outpatient Medications Prior to Visit  Medication Sig Dispense Refill   amLODipine (NORVASC) 10 MG tablet Take 1 tablet (10 mg total) by mouth daily. 90 tablet 3   aspirin EC 81 MG tablet Take 81 mg by mouth daily.     lisinopril (ZESTRIL)  20 MG tablet Take 1 tablet by mouth once daily 30 tablet 4   metoprolol (TOPROL-XL) 200 MG 24 hr tablet TAKE 1 TABLET BY MOUTH ONCE DAILY. TAKE WITH OR IMMEDIATELY FOLLOWING A MEAL 30 tablet 4   ibuprofen (ADVIL) 800 MG tablet Take 1 tablet (800 mg total) by mouth 3 (three) times daily. 21 tablet 0   Facility-Administered Medications Prior to Visit  Medication Dose Route Frequency Provider Last Rate Last Admin   cloNIDine (CATAPRES) tablet 0.2 mg  0.2 mg Oral Once Barbette Merino, NP        No Known Allergies  ROS Review of Systems  Constitutional: Negative.   HENT: Negative.   Eyes: Negative.    Respiratory: Negative for shortness of breath.   Cardiovascular: Negative for chest pain.  Gastrointestinal: Negative for constipation, diarrhea, nausea and vomiting.  Endocrine: Negative.   Genitourinary: Negative.   Musculoskeletal: Negative.   Skin:       Occasional breast itch  Allergic/Immunologic: Negative.   Neurological: Negative for dizziness and headaches.  Hematological: Negative.   Psychiatric/Behavioral: Negative.       Objective:    Physical Exam Constitutional:      General: He is not in acute distress.    Appearance: He is obese.  HENT:     Head: Normocephalic and atraumatic.     Nose: Nose normal.     Mouth/Throat:     Mouth: Mucous membranes are moist.  Cardiovascular:     Rate and Rhythm: Normal rate and regular rhythm.     Pulses: Normal pulses.     Heart sounds: Normal heart sounds.  Pulmonary:     Effort: Pulmonary effort is normal.     Breath sounds: Normal breath sounds.  Abdominal:     Palpations: Abdomen is soft.     Comments: Increased abdominal girth   Musculoskeletal:        General: Normal range of motion.     Cervical back: Normal range of motion.     Right lower leg: No edema.     Left lower leg: No edema.  Skin:    General: Skin is warm and dry.     Capillary Refill: Capillary refill takes less than 2 seconds.  Neurological:     General: No focal deficit present.     Mental Status: He is alert and oriented to person, place, and time.  Psychiatric:        Mood and Affect: Mood normal.        Behavior: Behavior normal.        Thought Content: Thought content normal.        Judgment: Judgment normal.     BP 136/77 (BP Location: Left Arm, Patient Position: Sitting, Cuff Size: Large)    Pulse 84    Temp (!) 97.3 F (36.3 C) (Temporal)    Ht 6\' 1"  (1.854 m)    Wt 251 lb 3.2 oz (113.9 kg)    SpO2 98%    BMI 33.14 kg/m  Wt Readings from Last 3 Encounters:  06/28/20 251 lb 3.2 oz (113.9 kg)  03/13/20 249 lb 14.4 oz (113.4 kg)   03/03/20 247 lb (112 kg)     There are no preventive care reminders to display for this patient.  There are no preventive care reminders to display for this patient.  No results found for: TSH Lab Results  Component Value Date   WBC 5.5 10/05/2019   HGB 16.0 10/05/2019   HCT 45.7  10/05/2019   MCV 87 10/05/2019   PLT 316 10/05/2019   Lab Results  Component Value Date   NA 138 03/03/2020   K 4.4 03/03/2020   CO2 22 03/03/2019   GLUCOSE 105 (H) 03/03/2020   BUN 14 03/03/2020   CREATININE 1.14 03/03/2020   BILITOT 0.2 03/03/2020   ALKPHOS 65 03/03/2020   AST 24 03/03/2020   ALT 40 03/10/2018   PROT 7.2 03/03/2020   ALBUMIN 4.5 03/03/2020   CALCIUM 9.3 03/03/2020   ANIONGAP 8 03/23/2018   Lab Results  Component Value Date   CHOL 188 03/03/2020   Lab Results  Component Value Date   HDL 43 03/03/2020   Lab Results  Component Value Date   LDLCALC 121 (H) 03/03/2020   Lab Results  Component Value Date   TRIG 135 03/03/2020   Lab Results  Component Value Date   CHOLHDL 4.4 03/03/2020   Lab Results  Component Value Date   HGBA1C 5.3 03/23/2018      Assessment & Plan:   Problem List Items Addressed This Visit      Cardiovascular and Mediastinum   Essential hypertension - Primary (Chronic) Stable Encouraged on going compliance with current medication regimen Encouraged home monitoring and recording BP <130/80 Eating a heart-healthy diet with less salt Encouraged regular physical activity  Recommend Weight loss Exercised provided to strengthen core    Other Visit Diagnoses    Obesity (BMI 30-39.9)     Persistent    Family history of breast cancer     Mom died of breast cancer age of diagnoses unknown 2 maternal aunts with breast cancer age of dx uknown Information to be obtained to for genetic testing and screening recommendations. Ocassional breast itch encouraged vitamin E oil Derrick Clayton has one daughter      No orders of the defined types were  placed in this encounter.   Follow-up: Return in about 3 months (around 09/25/2020).    Barbette Merino, NP

## 2020-06-28 NOTE — Patient Instructions (Addendum)

## 2020-09-07 ENCOUNTER — Other Ambulatory Visit: Payer: Self-pay | Admitting: Cardiology

## 2020-09-07 DIAGNOSIS — Z8679 Personal history of other diseases of the circulatory system: Secondary | ICD-10-CM

## 2020-09-07 DIAGNOSIS — I10 Essential (primary) hypertension: Secondary | ICD-10-CM

## 2020-09-07 DIAGNOSIS — Z9889 Other specified postprocedural states: Secondary | ICD-10-CM

## 2020-09-14 ENCOUNTER — Other Ambulatory Visit: Payer: Self-pay | Admitting: Cardiology

## 2020-09-14 DIAGNOSIS — I10 Essential (primary) hypertension: Secondary | ICD-10-CM

## 2020-09-14 DIAGNOSIS — Z9889 Other specified postprocedural states: Secondary | ICD-10-CM

## 2020-09-14 DIAGNOSIS — Z8679 Personal history of other diseases of the circulatory system: Secondary | ICD-10-CM

## 2020-09-20 ENCOUNTER — Ambulatory Visit: Payer: 59 | Admitting: Cardiology

## 2020-09-25 ENCOUNTER — Encounter: Payer: Self-pay | Admitting: Nurse Practitioner

## 2020-09-25 ENCOUNTER — Other Ambulatory Visit: Payer: Self-pay

## 2020-09-25 ENCOUNTER — Ambulatory Visit (INDEPENDENT_AMBULATORY_CARE_PROVIDER_SITE_OTHER): Payer: 59 | Admitting: Nurse Practitioner

## 2020-09-25 VITALS — BP 140/71 | HR 76 | Temp 97.9°F | Ht 73.0 in | Wt 249.0 lb

## 2020-09-25 DIAGNOSIS — I10 Essential (primary) hypertension: Secondary | ICD-10-CM

## 2020-09-25 DIAGNOSIS — Z13220 Encounter for screening for lipoid disorders: Secondary | ICD-10-CM

## 2020-09-25 DIAGNOSIS — I712 Thoracic aortic aneurysm, without rupture, unspecified: Secondary | ICD-10-CM | POA: Insufficient documentation

## 2020-09-25 DIAGNOSIS — Z8679 Personal history of other diseases of the circulatory system: Secondary | ICD-10-CM | POA: Diagnosis not present

## 2020-09-25 DIAGNOSIS — Z9889 Other specified postprocedural states: Secondary | ICD-10-CM | POA: Diagnosis not present

## 2020-09-25 LAB — POCT URINALYSIS DIPSTICK
Bilirubin, UA: NEGATIVE
Glucose, UA: NEGATIVE
Ketones, UA: NEGATIVE
Leukocytes, UA: NEGATIVE
Nitrite, UA: NEGATIVE
Protein, UA: NEGATIVE
Spec Grav, UA: >=1.03 — AB (ref 1.010–1.025)
Urobilinogen, UA: 0.2 E.U./dL
pH, UA: 5.5 (ref 5.0–8.0)

## 2020-09-25 NOTE — Patient Instructions (Signed)
Managing Your Hypertension Hypertension, also called high blood pressure, is when the force of the blood pressing against the walls of the arteries is too strong. Arteries are blood vessels that carry blood from your heart throughout your body. Hypertension forces the heart to work harder to pump blood and may cause the arteries to become narrow or stiff. Understanding blood pressure readings Your personal target blood pressure may vary depending on your medical conditions, your age, and other factors. A blood pressure reading includes a higher number over a lower number. Ideally, your blood pressure should be below 120/80. You should know that:  The first, or top, number is called the systolic pressure. It is a measure of the pressure in your arteries as your heart beats.  The second, or bottom number, is called the diastolic pressure. It is a measure of the pressure in your arteries as the heart relaxes. Blood pressure is classified into four stages. Based on your blood pressure reading, your health care provider may use the following stages to determine what type of treatment you need, if any. Systolic pressure and diastolic pressure are measured in a unit called mmHg. Normal  Systolic pressure: below 120.  Diastolic pressure: below 80. Elevated  Systolic pressure: 120-129.  Diastolic pressure: below 80. Hypertension stage 1  Systolic pressure: 130-139.  Diastolic pressure: 80-89. Hypertension stage 2  Systolic pressure: 140 or above.  Diastolic pressure: 90 or above. How can this condition affect me? Managing your hypertension is an important responsibility. Over time, hypertension can damage the arteries and decrease blood flow to important parts of the body, including the brain, heart, and kidneys. Having untreated or uncontrolled hypertension can lead to:  A heart attack.  A stroke.  A weakened blood vessel (aneurysm).  Heart failure.  Kidney damage.  Eye  damage.  Metabolic syndrome.  Memory and concentration problems.  Vascular dementia. What actions can I take to manage this condition? Hypertension can be managed by making lifestyle changes and possibly by taking medicines. Your health care provider will help you make a plan to bring your blood pressure within a normal range. Nutrition  Eat a diet that is high in fiber and potassium, and low in salt (sodium), added sugar, and fat. An example eating plan is called the Dietary Approaches to Stop Hypertension (DASH) diet. To eat this way: ? Eat plenty of fresh fruits and vegetables. Try to fill one-half of your plate at each meal with fruits and vegetables. ? Eat whole grains, such as whole-wheat pasta, brown rice, or whole-grain bread. Fill about one-fourth of your plate with whole grains. ? Eat low-fat dairy products. ? Avoid fatty cuts of meat, processed or cured meats, and poultry with skin. Fill about one-fourth of your plate with lean proteins such as fish, chicken without skin, beans, eggs, and tofu. ? Avoid pre-made and processed foods. These tend to be higher in sodium, added sugar, and fat.  Reduce your daily sodium intake. Most people with hypertension should eat less than 1,500 mg of sodium a day.   Lifestyle  Work with your health care provider to maintain a healthy body weight or to lose weight. Ask what an ideal weight is for you.  Get at least 30 minutes of exercise that causes your heart to beat faster (aerobic exercise) most days of the week. Activities may include walking, swimming, or biking.  Include exercise to strengthen your muscles (resistance exercise), such as weight lifting, as part of your weekly exercise routine. Try   to do these types of exercises for 30 minutes at least 3 days a week.  Do not use any products that contain nicotine or tobacco, such as cigarettes, e-cigarettes, and chewing tobacco. If you need help quitting, ask your health care  provider.  Control any long-term (chronic) conditions you have, such as high cholesterol or diabetes.  Identify your sources of stress and find ways to manage stress. This may include meditation, deep breathing, or making time for fun activities.   Alcohol use  Do not drink alcohol if: ? Your health care provider tells you not to drink. ? You are pregnant, may be pregnant, or are planning to become pregnant.  If you drink alcohol: ? Limit how much you use to:  0-1 drink a day for women.  0-2 drinks a day for men. ? Be aware of how much alcohol is in your drink. In the [place] one drink equals one 12 oz bottle of beer (355 mL), one 5 oz glass of wine (148 mL), or one 1 oz glass of hard liquor (44 mL). Medicines Your health care provider may prescribe medicine if lifestyle changes are not enough to get your blood pressure under control and if:  Your systolic blood pressure is 130 or higher.  Your diastolic blood pressure is 80 or higher. Take medicines only as told by your health care provider. Follow the directions carefully. Blood pressure medicines must be taken as told by your health care provider. The medicine does not work as well when you skip doses. Skipping doses also puts you at risk for problems. Monitoring Before you monitor your blood pressure:  Do not smoke, drink caffeinated beverages, or exercise within 30 minutes before taking a measurement.  Use the bathroom and empty your bladder (urinate).  Sit quietly for at least 5 minutes before taking measurements. Monitor your blood pressure at home as told by your health care provider. To do this:  Sit with your back straight and supported.  Place your feet flat on the floor. Do not cross your legs.  Support your arm on a flat surface, such as a table. Make sure your upper arm is at heart level.  Each time you measure, take two or three readings one minute apart and record the results. You may also need to have your  blood pressure checked regularly by your health care provider.   General information  Talk with your health care provider about your diet, exercise habits, and other lifestyle factors that may be contributing to hypertension.  Review all the medicines you take with your health care provider because there may be side effects or interactions.  Keep all visits as told by your health care provider. Your health care provider can help you create and adjust your plan for managing your high blood pressure. Where to find more information  National Heart, Lung, and Blood Institute: www.nhlbi.nih.gov  American Heart Association: www.heart.org Contact a health care provider if:  You think you are having a reaction to medicines you have taken.  You have repeated (recurrent) headaches.  You feel dizzy.  You have swelling in your ankles.  You have trouble with your vision. Get help right away if:  You develop a severe headache or confusion.  You have unusual weakness or numbness, or you feel faint.  You have severe pain in your chest or abdomen.  You vomit repeatedly.  You have trouble breathing. These symptoms may represent a serious problem that is an emergency. Do not wait   to see if the symptoms will go away. Get medical help right away. Call your local emergency services (911 in the [place] Do not drive yourself to the hospital. Summary  Hypertension is when the force of blood pumping through your arteries is too strong. If this condition is not controlled, it may put you at risk for serious complications.  Your personal target blood pressure may vary depending on your medical conditions, your age, and other factors. For most people, a normal blood pressure is less than 120/80.  Hypertension is managed by lifestyle changes, medicines, or both.  Lifestyle changes to help manage hypertension include losing weight, eating a healthy, low-sodium diet, exercising more, stopping smoking, and  limiting alcohol. This information is not intended to replace advice given to you by your health care provider. Make sure you discuss any questions you have with your health care provider. Document Revised: 06/18/2019 Document Reviewed: 04/13/2019 Elsevier Patient Education  2021 Elsevier Inc.  

## 2020-09-25 NOTE — Progress Notes (Signed)
[facility] [address] [place], [place]  [address] Phone:  [phone]   Fax:  [phone]   Established Patient Office Visit  Subjective:  Patient ID: Derrick Clayton, male    DOB: 09-14-1980  Age: 40 y.o. MRN: [id number]  CC:  Chief Complaint  Patient presents with  . Follow-up    Follow up , htn     HPI Derrick Clayton presents for follow up. He  has a past medical history of Acute thoracic aortic dissection (HCC) (03/10/2018), COVID (04/2020), Hypertension, and S/P aortic dissection repair (03/11/2018).   Hypertension Patient is here for follow-up of elevated blood pressure. He is not exercising and is adherent to a low-salt diet. Blood pressure  well controlled at home. Cardiac symptoms: none. Patient denies chest pain, dyspnea and lower extremity edema. Cardiovascular risk factors: AA. Use of agents associated with hypertension: none. History of target organ damage: thoracic aortic aneurysm .   Past Medical History:  Diagnosis Date  . Acute thoracic aortic dissection (HCC) 03/10/2018  . COVID 04/2020  . Hypertension   . S/P aortic dissection repair 03/11/2018   Straight graft replacement of ascending thoracic aorta with resuspension of native aortic valve and open hemi-arch distal anastomosis    Past Surgical History:  Procedure Laterality Date  . AORTIC INTERVENTION N/A 03/10/2018   Procedure: RESUSPENSION OF THE NATIVE AORTIC VALVE;  Surgeon: Purcell Nails, MD;  Location: [facility] OR;  Service: Open Heart Surgery;  Laterality: N/A;  . AORTIC VALVE REPAIR N/A 03/10/2018   Procedure: REPAIR OF ASCENDING AORTIC DISECTION USING STRAIGHT HEMASHIELD PLATINUM VASCULAR GRAFT; OPEN HEMIARCH DISTAL ANASTOMOSIS;  Surgeon: Purcell Nails, MD;  Location: [facility] OR;  Service: Open Heart Surgery;  Laterality: N/A;  . FOOT SURGERY      Family History  Problem Relation Age of Onset  . Cancer Mother   . Heart failure Father     Social History   Socioeconomic  History  . Marital status: Single    Spouse name: Not on file  . Number of children: Not on file  . Years of education: Not on file  . Highest education level: Not on file  Occupational History  . Not on file  Tobacco Use  . Smoking status: Former Smoker    Packs/day: 0.00    Types: Cigarettes  . Smokeless tobacco: Never Used  Vaping Use  . Vaping Use: Never used  Substance and Sexual Activity  . Alcohol use: Not Currently    Comment: 3-4 days a week  . Drug use: Yes    Types: Marijuana    Comment: daily  . Sexual activity: Not on file  Other Topics Concern  . Not on file  Social History Narrative  . Not on file   Social Determinants of Health   Financial Resource Strain: Not on file  Food Insecurity: Not on file  Transportation Needs: Not on file  Physical Activity: Not on file  Stress: Not on file  Social Connections: Not on file  Intimate Partner Violence: Not on file    Outpatient Medications Prior to Visit  Medication Sig Dispense Refill  . amLODipine (NORVASC) 10 MG tablet Take 1 tablet (10 mg total) by mouth daily. 90 tablet 3  . aspirin EC 81 MG tablet Take 81 mg by mouth daily.    Marland Kitchen lisinopril (ZESTRIL) 20 MG tablet TAKE 1 TABLET BY MOUTH ONCE DAILY . APPOINTMENT REQUIRED FOR FUTURE REFILLS 90 tablet 1  . metoprolol (  TOPROL-XL) 200 MG 24 hr tablet TAKE 1 TABLET BY MOUTH ONCE DAILY WITH MEALS OR  IMMEDIATELY  FOLLOWING  A  MEAL 30 tablet 11   Facility-Administered Medications Prior to Visit  Medication Dose Route Frequency Provider Last Rate Last Admin  . cloNIDine (CATAPRES) tablet 0.2 mg  0.2 mg Oral Once Barbette Merino, NP        No Known Allergies  ROS Review of Systems  Gastrointestinal: Positive for nausea (comes and goes and last for few hours  ).      Objective:    Physical Exam Constitutional:      Appearance: He is obese.  HENT:     Head: Normocephalic and atraumatic.     Nose: Nose normal.  Cardiovascular:     Rate and Rhythm:  Normal rate and regular rhythm.     Pulses: Normal pulses.     Heart sounds: Normal heart sounds.  Pulmonary:     Effort: Pulmonary effort is normal.     Breath sounds: Normal breath sounds.  Musculoskeletal:     Cervical back: Normal range of motion.     Right lower leg: No edema.     Left lower leg: No edema.  Skin:    General: Skin is warm and dry.     Capillary Refill: Capillary refill takes less than 2 seconds.  Neurological:     General: No focal deficit present.     Mental Status: He is alert and oriented to person, place, and time.  Psychiatric:        Mood and Affect: Mood normal.        Behavior: Behavior normal.        Thought Content: Thought content normal.        Judgment: Judgment normal.     BP 140/71 (BP Location: Left Arm, Patient Position: Sitting, Cuff Size: Large)   Pulse 76   Temp 97.9 F (36.6 C) (Temporal)   Ht 6\' 1"  (1.854 m)   Wt 249 lb (112.9 kg)   SpO2 96%   BMI 32.85 kg/m  Wt Readings from Last 3 Encounters:  09/25/20 249 lb (112.9 kg)  06/28/20 251 lb 3.2 oz (113.9 kg)  03/13/20 249 lb 14.4 oz (113.4 kg)     There are no preventive care reminders to display for this patient.  There are no preventive care reminders to display for this patient.  No results found for: TSH Lab Results  Component Value Date   WBC 5.5 10/05/2019   HGB 16.0 10/05/2019   HCT 45.7 10/05/2019   MCV 87 10/05/2019   PLT 316 10/05/2019   Lab Results  Component Value Date   NA 138 03/03/2020   K 4.4 03/03/2020   CO2 22 03/03/2019   GLUCOSE 105 (H) 03/03/2020   BUN 14 03/03/2020   CREATININE 1.14 03/03/2020   BILITOT 0.2 03/03/2020   ALKPHOS 65 03/03/2020   AST 24 03/03/2020   ALT 40 03/10/2018   PROT 7.2 03/03/2020   ALBUMIN 4.5 03/03/2020   CALCIUM 9.3 03/03/2020   ANIONGAP 8 03/23/2018   Lab Results  Component Value Date   CHOL 188 03/03/2020   Lab Results  Component Value Date   HDL 43 03/03/2020   Lab Results  Component Value Date    LDLCALC 121 (H) 03/03/2020   Lab Results  Component Value Date   TRIG 135 03/03/2020   Lab Results  Component Value Date   CHOLHDL 4.4 03/03/2020   Lab Results  Component Value Date   HGBA1C 5.3 03/23/2018      Assessment & Plan:   Problem List Items Addressed This Visit      Cardiovascular and Mediastinum   Thoracic aortic aneurysm without rupture (HCC) Repaired Continue to follow up with cardiology    History of aortic dissection   Essential hypertension (Chronic) Persistent but stable Encouraged on going compliance with current medication regimen Encouraged home monitoring and recording BP <130/80 Eating a heart-healthy diet with less salt Encouraged regular physical activity  Recommend Weight loss   Relevant Orders   Comp. Metabolic Panel (12)     Other   S/P repair ascending aortic dissection    Other Visit Diagnoses    Screening for cholesterol level       Relevant Orders   Lipid panel      No orders of the defined types were placed in this encounter.   Follow-up: Return in about 6 months (around 03/28/2021).    Barbette Merino, NP

## 2020-09-26 LAB — LIPID PANEL
Chol/HDL Ratio: 4 ratio (ref 0.0–5.0)
Cholesterol, Total: 177 mg/dL (ref 100–199)
HDL: 44 mg/dL (ref >39)
LDL Chol Calc (NIH): 94 mg/dL (ref 0–99)
Triglycerides: 229 mg/dL — ABNORMAL HIGH (ref 0–149)
VLDL Cholesterol Cal: 39 mg/dL (ref 5–40)

## 2020-09-26 LAB — COMP. METABOLIC PANEL (12)
AST: 23 IU/L (ref 0–40)
Albumin/Globulin Ratio: 1.4 (ref 1.2–2.2)
Albumin: 4.3 g/dL (ref 4.0–5.0)
Alkaline Phosphatase: 63 IU/L (ref 44–121)
BUN/Creatinine Ratio: 14 (ref 9–20)
BUN: 18 mg/dL (ref 6–24)
Bilirubin Total: <0.2 mg/dL (ref 0.0–1.2)
Calcium: 9.6 mg/dL (ref 8.7–10.2)
Chloride: 103 mmol/L (ref 96–106)
Creatinine, Ser: 1.28 mg/dL — ABNORMAL HIGH (ref 0.76–1.27)
Globulin, Total: 3.1 g/dL (ref 1.5–4.5)
Glucose: 91 mg/dL (ref 65–99)
Potassium: 4.6 mmol/L (ref 3.5–5.2)
Sodium: 140 mmol/L (ref 134–144)
Total Protein: 7.4 g/dL (ref 6.0–8.5)
eGFR: 72 mL/min/{1.73_m2} (ref >59)

## 2020-09-29 ENCOUNTER — Ambulatory Visit: Payer: 59 | Admitting: Cardiology

## 2020-10-01 NOTE — Progress Notes (Signed)
Cardiology Office Note:    Date:  10/05/2020   ID:  Derrick Clayton, DOB [birth year], MRN [id number]  PCP:  Barbette Merino, NP  Cardiologist:  Jodelle Red, MD  Electrophysiologist:  None   Referring MD: Barbette Merino, NP   Chief Complaint: routine follow-up of aortic dissection repair  History of Present Illness:    Derrick Clayton is a 40 y.o. male with a history of acute ascending aortic dissection s/p repair in 02/2018, hypertension, and tobacco abuse who is followed by Dr. Cristal Deer and presents today for routine follow-up.   Patient present to the ED in 02/2018 with chest pain and hypertensive urgency. He had severe chest pain and foot pain and was found to have an ascending aortic dissection and no doppler to the left foot. The dissection also involved the right carotid, celiac axis, SMA, and bilateral iliac arteries. He was emergently taken to the OR for repair of ascending aortic dissection using straight hemashield platinum 26 mm vascular graft, open hemiarch distal anastomosis, and resuspension of the native aortic valve. He was noted to have possible atrial tachycardia vs flutter post-operatively but risk was felt to outweigh the benefit of anticoagulation.   Patient was last seen by Dr. Cristal Deer in 02/2020 at which time BP mildly elevated in the 140s/80s. He reported being compliant with his medications but did admit that he had been eating more and had been more sedentary leading to weight gain. However, he denied any cardiac symptoms. Amlodipine had recently been increased by PCP. Patient wanted to avoid more medications and elected to focus more on lifestyle changes.   Patient presents today for follow-up. He has done well since last visit. He has started walking. He states he walks about 2 miles four times per week. No problems with this. No chest pain, shortness of breath, orthopnea, PND, edema, palpitations, lightheadedness, dizziness, near syncope/syncpoe. He states he  is no longer smoking. He does not check his BP at home but BP mildly elevated with systolic BP around 140 at recent office visits.   Past Medical History:  Diagnosis Date  . Acute thoracic aortic dissection (HCC) 03/10/2018  . COVID 04/2020  . Hypertension   . S/P aortic dissection repair 03/11/2018   Straight graft replacement of ascending thoracic aorta with resuspension of native aortic valve and open hemi-arch distal anastomosis    Past Surgical History:  Procedure Laterality Date  . AORTIC INTERVENTION N/A 03/10/2018   Procedure: RESUSPENSION OF THE NATIVE AORTIC VALVE;  Surgeon: Purcell Nails, MD;  Location: [facility] OR;  Service: Open Heart Surgery;  Laterality: N/A;  . AORTIC VALVE REPAIR N/A 03/10/2018   Procedure: REPAIR OF ASCENDING AORTIC DISECTION USING STRAIGHT HEMASHIELD PLATINUM VASCULAR GRAFT; OPEN HEMIARCH DISTAL ANASTOMOSIS;  Surgeon: Purcell Nails, MD;  Location: [facility] OR;  Service: Open Heart Surgery;  Laterality: N/A;  . FOOT SURGERY      Current Medications: Current Meds  Medication Sig  . amLODipine (NORVASC) 10 MG tablet Take 1 tablet (10 mg total) by mouth daily.  Marland Kitchen aspirin EC 81 MG tablet Take 81 mg by mouth daily.  . metoprolol (TOPROL-XL) 200 MG 24 hr tablet TAKE 1 TABLET BY MOUTH ONCE DAILY WITH MEALS OR  IMMEDIATELY  FOLLOWING  A  MEAL  . [DISCONTINUED] lisinopril (ZESTRIL) 20 MG tablet TAKE 1 TABLET BY MOUTH ONCE DAILY . APPOINTMENT REQUIRED FOR FUTURE REFILLS   Current Facility-Administered Medications for the 10/05/20 encounter (Office Visit) with Corrin Parker, PA-C  Medication  .  cloNIDine (CATAPRES) tablet 0.2 mg     Allergies:   Patient has no known allergies.   Social History   Socioeconomic History  . Marital status: Single    Spouse name: Not on file  . Number of children: Not on file  . Years of education: Not on file  . Highest education level: Not on file  Occupational History  . Not on file  Tobacco Use  . Smoking  status: Former Smoker    Packs/day: 0.00    Types: Cigarettes  . Smokeless tobacco: Never Used  Vaping Use  . Vaping Use: Never used  Substance and Sexual Activity  . Alcohol use: Not Currently    Comment: 3-4 days a week  . Drug use: Yes    Types: Marijuana    Comment: daily  . Sexual activity: Not on file  Other Topics Concern  . Not on file  Social History Narrative  . Not on file   Social Determinants of Health   Financial Resource Strain: Not on file  Food Insecurity: Not on file  Transportation Needs: Not on file  Physical Activity: Not on file  Stress: Not on file  Social Connections: Not on file     Family History: The patient's family history includes Cancer in his mother; Heart failure in his father.  ROS:   Please see the history of present illness.     EKGs/Labs/Other Studies Reviewed:    The following studies were reviewed today:  Chest/Abdominal/Pelvic CTA 03/15/2019: Impressions: - Grossly stable thoracic aortic dissection extending from transverse aortic arch and through descending thoracic aorta and into the abdominal aorta to the level of the bifurcation. - Dissection flaps are again noted to extend into the right innominate, right subclavian and left subclavian arteries, as well as involving the proximal portions of both renal arteries. This is unchanged compared to prior exam. 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 - Status post surgical repair of ascending thoracic aorta. - Aortic Atherosclerosis (ICD10-I70.0).  EKG:  EKG not ordered today.   Recent Labs: 09/25/2020: BUN 18; Creatinine, Ser 1.28; Potassium 4.6; Sodium 140  Recent Lipid Panel    Component Value Date/Time   CHOL 177 09/25/2020 1038   TRIG 229 (H) 09/25/2020 1038   HDL 44 09/25/2020 1038   CHOLHDL 4.0 09/25/2020 1038   LDLCALC 94 09/25/2020 1038    Physical Exam:    Vital Signs: BP 138/80   Pulse  83   Ht 6\' 1"  (1.854 m)   Wt 248 lb (112.5 kg)   SpO2 94%   BMI 32.72 kg/m     Wt Readings from Last 3 Encounters:  10/05/20 248 lb (112.5 kg)  09/25/20 249 lb (112.9 kg)  06/28/20 251 lb 3.2 oz (113.9 kg)     General: 40 y.o. male in no acute distress. HEENT: Normocephalic and atraumatic. Sclera clear.  Neck: Supple. No carotid bruits. No JVD. Heart: RRR. Distinct S1 and S2. No murmurs, gallops, or rubs. Radial pulses 2+ and equal bilaterally. Lungs: No increased work of breathing. Clear to ausculation bilaterally. No wheezes, rhonchi, or rales.  Abdomen: Soft, non-distended, and non-tender to palpation.  Extremities: No lower extremity edema.    Skin: Warm and dry. Neuro: Alert and oriented x3. No focal deficits. Psych: Normal affect. Responds appropriately.  Assessment:    1. History of aortic dissection   2. S/P repair ascending aortic dissection   3. Essential hypertension  4. Tobacco abuse     Plan:    History of Acute Aortic Dissection s/p Repair in 02/2018 - Last CTA in 02/2019 showed stable thoracic aortic dissection extending to the abdominal aorta with dissection flaps still noted. Also showed stable proximal descending thoracic aneurysm measuring 4.9 cm. Please see full report above. - Doing well. No cardiac symptoms. - Re-emphasized the importance of strict BP control. BP goal <130/80. Continue medications as below. - Continue aspirin, beta blocker, and statin. - Recommended avoiding heavy lifting/straining.  - Patient was supposed to have repeat CTA in 08/2019 and then follow-up with Dr. Cornelius Moras in 08/2019 but no showed CTA. Recommended calling CT surgery to get this rescheduled and following up with them as directed. Patient states he will do this.  Hypertension - BP slightly above at 138/80 today. Patient does not check his BP at home but normally above goal of 130/80 at office visits.  - Continue Amlodipine 10mg  daily and Toprol-XL 200mg  daily.  - Will  increase Lisinopril 30mg  daily. - Will repeat BMET in 1-2 weeks.  Former Tobacco Abuse - Patient states he has quit smoking. Re-enforced the importance of not smoking.  Disposition: Follow up in 6 months.   Medication Adjustments/Labs and Tests Ordered: Current medicines are reviewed at length with the patient today.  Concerns regarding medicines are outlined above.  Orders Placed This Encounter  Procedures  . Basic metabolic panel   Meds ordered this encounter  Medications  . lisinopril (ZESTRIL) 20 MG tablet    Sig: Take 1.5 tablets (30 mg total) by mouth daily.    Dispense:  90 tablet    Refill:  1    Patient Instructions  Medication Instructions:  Increase Lisinopril to 30 mg daily   *If you need a refill on your cardiac medications before your next appointment, please call your pharmacy*   Lab Work: BMET in 1-2 weeks.   If you have labs (blood work) drawn today and your tests are completely normal, you will receive your results only by: Marland Kitchen MyChart Message (if you have MyChart) OR . A paper copy in the mail If you have any lab test that is abnormal or we need to change your treatment, we will call you to review the results.   Follow-Up: At [facility], you and your health needs are our priority.  As part of our continuing mission to provide you with exceptional heart care, we have created designated Provider Care Teams.  These Care Teams include your primary Cardiologist (physician) and Advanced Practice Providers (APPs -  Physician Assistants and Nurse Practitioners) who all work together to provide you with the care you need, when you need it.  We recommend signing up for the patient portal called "MyChart".  Sign up information is provided on this After Visit Summary.  MyChart is used to connect with patients for Virtual Visits (Telemedicine).  Patients are able to view lab/test results, encounter notes, upcoming appointments, etc.  Non-urgent messages can be sent  to your provider as well.   To learn more about what you can do with MyChart, go to [email or web address].    Your next appointment:   6 month(s)  The format for your next appointment:   In Person  Provider:   Jodelle Red, MD ([facility])   Other Instructions [facility] information:  [address]       [place] [address]  [phone]       Signed, Corrin Parker, PA-C  10/05/2020 9:51 AM    [facility]

## 2020-10-05 ENCOUNTER — Encounter: Payer: Self-pay | Admitting: Student

## 2020-10-05 ENCOUNTER — Other Ambulatory Visit: Payer: Self-pay

## 2020-10-05 ENCOUNTER — Ambulatory Visit (INDEPENDENT_AMBULATORY_CARE_PROVIDER_SITE_OTHER): Payer: 59 | Admitting: Student

## 2020-10-05 VITALS — BP 138/80 | HR 83 | Ht 73.0 in | Wt 248.0 lb

## 2020-10-05 DIAGNOSIS — Z8679 Personal history of other diseases of the circulatory system: Secondary | ICD-10-CM

## 2020-10-05 DIAGNOSIS — Z9889 Other specified postprocedural states: Secondary | ICD-10-CM

## 2020-10-05 DIAGNOSIS — Z720 Tobacco use: Secondary | ICD-10-CM

## 2020-10-05 DIAGNOSIS — I10 Essential (primary) hypertension: Secondary | ICD-10-CM

## 2020-10-05 MED ORDER — LISINOPRIL 20 MG PO TABS: 30.0000 mg | ORAL_TABLET | Freq: Every day | ORAL | 1 refills | Status: DC

## 2020-10-05 NOTE — Addendum Note (Signed)
Addended by: Dimas Scheck T on: 10/05/2020 01:51 PM   Modules accepted: Orders  

## 2020-10-05 NOTE — Addendum Note (Signed)
Addended by: Darene Lamer T on: 10/05/2020 01:52 PM   Modules accepted: Orders

## 2020-10-05 NOTE — Patient Instructions (Addendum)
Medication Instructions:  Increase Lisinopril to 30 mg daily   *If you need a refill on your cardiac medications before your next appointment, please call your pharmacy*   Lab Work: BMET in 1-2 weeks.   If you have labs (blood work) drawn today and your tests are completely normal, you will receive your results only by: Marland Kitchen MyChart Message (if you have MyChart) OR . A paper copy in the mail If you have any lab test that is abnormal or we need to change your treatment, we will call you to review the results.   Follow-Up: At [facility], you and your health needs are our priority.  As part of our continuing mission to provide you with exceptional heart care, we have created designated Provider Care Teams.  These Care Teams include your primary Cardiologist (physician) and Advanced Practice Providers (APPs -  Physician Assistants and Nurse Practitioners) who all work together to provide you with the care you need, when you need it.  We recommend signing up for the patient portal called "MyChart".  Sign up information is provided on this After Visit Summary.  MyChart is used to connect with patients for Virtual Visits (Telemedicine).  Patients are able to view lab/test results, encounter notes, upcoming appointments, etc.  Non-urgent messages can be sent to your provider as well.   To learn more about what you can do with MyChart, go to [email or web address].    Your next appointment:   6 month(s)  The format for your next appointment:   In Person  Provider:   Jodelle Red, MD ([facility])   Other Instructions [facility] information:  [address]       [place] [address]             [phone]

## 2021-03-29 ENCOUNTER — Ambulatory Visit (INDEPENDENT_AMBULATORY_CARE_PROVIDER_SITE_OTHER): Payer: Self-pay | Admitting: Nurse Practitioner

## 2021-03-29 ENCOUNTER — Other Ambulatory Visit: Payer: Self-pay

## 2021-03-29 ENCOUNTER — Encounter: Payer: Self-pay | Admitting: Nurse Practitioner

## 2021-03-29 VITALS — BP 152/82 | HR 86 | Temp 97.7°F | Ht 73.0 in | Wt 248.2 lb

## 2021-03-29 DIAGNOSIS — R29818 Other symptoms and signs involving the nervous system: Secondary | ICD-10-CM

## 2021-03-29 DIAGNOSIS — E781 Pure hyperglyceridemia: Secondary | ICD-10-CM

## 2021-03-29 DIAGNOSIS — Z9889 Other specified postprocedural states: Secondary | ICD-10-CM

## 2021-03-29 DIAGNOSIS — R040 Epistaxis: Secondary | ICD-10-CM

## 2021-03-29 DIAGNOSIS — I10 Essential (primary) hypertension: Secondary | ICD-10-CM

## 2021-03-29 NOTE — Progress Notes (Signed)
[address] [place], [place]  [address] Phone:  [phone]   Fax:  [phone]   Established Patient Office Visit  Subjective:  Patient ID: Derrick Clayton, male    DOB: 19-Oct-1980  Age: 40 y.o. MRN: [id number]  CC:  Chief Complaint  Patient presents with   Follow-up    Pt is here today for his follow up visit for hypertension. Pt states that he has been nose bleeds off and on x 2 weeks.    HPI Bayard More presents for follow up. He  has a past medical history of Acute thoracic aortic dissection (03/10/2018), COVID (04/2020), Hypertension, and S/P aortic dissection repair (03/11/2018).   He has not been monitoring his BP. He reports nosebleeds. He admits to snoring. He does not feel rested in the mornings after a full nights sleep.  He also reports dry air.  He may have seasonal allergies.  Denies headache, dizziness, visual changes, shortness of breath, dyspnea on exertion, chest pain, nausea, vomiting or any edema.   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 and open hemi-arch distal anastomosis    Past Surgical History:  Procedure Laterality Date   AORTIC INTERVENTION N/A 03/10/2018   Procedure: RESUSPENSION OF THE NATIVE AORTIC VALVE;  Surgeon: Rexene Alberts, MD;  Location: [place];  Service: Open Heart Surgery;  Laterality: N/A;   AORTIC VALVE REPAIR N/A 03/10/2018   Procedure: REPAIR OF ASCENDING AORTIC DISECTION USING STRAIGHT HEMASHIELD PLATINUM 26MM VASCULAR GRAFT; OPEN HEMIARCH DISTAL ANASTOMOSIS;  Surgeon: Rexene Alberts, MD;  Location: [place];  Service: Open Heart Surgery;  Laterality: N/A;   FOOT SURGERY      Family History  Problem Relation Age of Onset   Cancer Mother    Heart failure Father     Social History    Socioeconomic History   Marital status: Single    Spouse name: Not on file   Number of children: Not on file   Years of education: Not on file   Highest education level: Not on file  Occupational History   Not on file  Tobacco Use   Smoking status: Former    Packs/day: 0.00    Types: Cigarettes   Smokeless tobacco: Never  Vaping Use   Vaping Use: Never used  Substance and Sexual Activity   Alcohol use: Not Currently    Comment: 3-4 days a week   Drug use: Yes    Types: Marijuana    Comment: daily   Sexual activity: Not on file  Other Topics Concern   Not on file  Social History Narrative   Not on file   Social Determinants of Health   Financial Resource Strain: Not on file  Food Insecurity: Not on file  Transportation Needs: Not on file  Physical Activity: Not on file  Stress: Not on file  Social Connections: Not on file  Intimate Partner Violence: Not on file    Outpatient Medications Prior to Visit  Medication Sig Dispense Refill   amLODipine (NORVASC) 10 MG tablet Take 1 tablet (10 mg total) by mouth daily. 90 tablet 3   aspirin EC 81 MG tablet Take 81 mg by mouth daily.     lisinopril (ZESTRIL) 20 MG tablet Take 1.5 tablets (30 mg  total) by mouth daily. 90 tablet 1   metoprolol (TOPROL-XL) 200 MG 24 hr tablet TAKE 1 TABLET BY MOUTH ONCE DAILY WITH MEALS OR  IMMEDIATELY  FOLLOWING  A  MEAL 30 tablet 11   cloNIDine (CATAPRES) tablet 0.2 mg      No facility-administered medications prior to visit.    No Known Allergies  ROS Review of Systems    Objective:    Physical Exam Constitutional:      General: He is not in acute distress.    Appearance: He is obese. He is not ill-appearing, toxic-appearing or diaphoretic.  HENT:     Head: Normocephalic and atraumatic.     Nose: Nose normal.     Mouth/Throat:     Mouth: Mucous membranes are moist.  Cardiovascular:     Rate and Rhythm: Normal rate and regular rhythm.     Pulses: Normal pulses.     Heart  sounds: Normal heart sounds.  Pulmonary:     Effort: Pulmonary effort is normal.     Breath sounds: Normal breath sounds.  Abdominal:     General: Bowel sounds are normal.     Palpations: Abdomen is soft.     Comments: Increased abdominal girth   Musculoskeletal:        General: Normal range of motion.     Cervical back: Normal range of motion.  Skin:    General: Skin is warm and dry.     Capillary Refill: Capillary refill takes less than 2 seconds.  Neurological:     General: No focal deficit present.     Mental Status: He is alert and oriented to person, place, and time.  Psychiatric:        Mood and Affect: Mood normal.        Behavior: Behavior normal.        Thought Content: Thought content normal.        Judgment: Judgment normal.   BP (!) 152/82   Pulse 86   Temp 97.7 F (36.5 C)   Ht _0  (1.854 m)   Wt 248 lb 3.2 oz (112.6 kg)   SpO2 96%   BMI 32.75 kg/m  Wt Readings from Last 3 Encounters:  03/29/21 248 lb 3.2 oz (112.6 kg)  10/05/20 248 lb (112.5 kg)  09/25/20 249 lb (112.9 kg)     There are no preventive care reminders to display for this patient.   There are no preventive care reminders to display for this patient.  No results found for: TSH Lab Results  Component Value Date   WBC 5.5 10/05/2019   HGB 16.0 10/05/2019   HCT 45.7 10/05/2019   MCV 87 10/05/2019   PLT 316 10/05/2019   Lab Results  Component Value Date   NA 140 09/25/2020   K 4.6 09/25/2020   CO2 22 03/03/2019   GLUCOSE 91 09/25/2020   BUN 18 09/25/2020   CREATININE 1.28 (H) 09/25/2020   BILITOT <0.2 09/25/2020   ALKPHOS 63 09/25/2020   AST 23 09/25/2020   ALT 40 03/10/2018   PROT 7.4 09/25/2020   ALBUMIN 4.3 09/25/2020   CALCIUM 9.6 09/25/2020   ANIONGAP 8 03/23/2018   EGFR 72 09/25/2020   Lab Results  Component Value Date   CHOL 177 09/25/2020   Lab Results  Component Value Date   HDL 44 09/25/2020   Lab Results  Component Value Date   LDLCALC 94 09/25/2020    Lab Results  Component Value Date  TRIG 229 (H) 09/25/2020   Lab Results  Component Value Date   CHOLHDL 4.0 09/25/2020   Lab Results  Component Value Date   HGBA1C 5.3 03/23/2018      Assessment & Plan:   Problem List Items Addressed This Visit       Cardiovascular and Mediastinum   Essential hypertension - Primary (Chronic) Persistent Encouraged on going compliance with current medication regimen Encouraged home monitoring and recording BP <130/80 Eating a heart-healthy diet with less salt Encouraged regular physical activity  Recommend Weight loss   Relevant Orders   Comp. Metabolic Panel (12)     Other   S/P repair ascending aortic dissection   Other Visit Diagnoses     High blood triglycerides  Encourage daily omega-3      Relevant Orders   Lipid panel   Suspected sleep apnea       Relevant Orders   Ambulatory referral to Sleep Studies   Bleeding from the nose Encourage humidifier Education provided    No orders of the defined types were placed in this encounter.   Follow-up: Return in about 3 months (around 06/29/2021).    Vevelyn Francois, NP

## 2021-03-29 NOTE — Patient Instructions (Signed)
Nosebleed, Adult A nosebleed is when blood comes out of the nose. Nosebleeds are common and can be caused by many things. They are usually not a sign of a serious medical problem. Follow these instructions at home: When you have a nosebleed:  Sit down. Tilt your head a little forward. Follow these steps: Pinch your nose with a clean towel or tissue. Keep pinching your nose for 5 minutes. Do not let go. After 5 minutes, let go of your nose. If there is still bleeding, do these steps again. Keep doing these steps until the bleeding stops. Do not put tissues or other things in your nose to stop the bleeding. Avoid lying down or putting your head back. Use a nose spray decongestant as told by your doctor. After a nosebleed: Try not to blow your nose or sniffle for several hours. Try not to strain, lift, or bend at the waist for several days. Aspirin and blood-thinning medicines make bleeding more likely. If you take these medicines: Ask your doctor if you should stop taking them or if you should change how much you take. Do not stop taking the medicine unless your doctor tells you to. If your nosebleed was caused by dryness, use over-the-counter saline nasal spray or gel and humidifier as told by your doctor. This will keep the inside of your nose moist and allow it to heal. If you need to use one of these products: Choose one that is water-soluble. Use only as much as you need and use it only as often as needed. Do not lie down right away after you use it. If you get nosebleeds often, talk with your doctor about treatments. These may include: Nasal cautery. A chemical swab or electrical device is used to lightly burn tiny blood vessels inside the nose. This helps stop or prevent nosebleeds. Nasal packing. A gauze or other material is placed in the nose to keep constant pressure on the bleeding area. Contact a doctor if: You have a fever. You get nosebleeds often. You are getting  nosebleeds more often than usual. You bruise very easily. You have something stuck in your nose. You have bleeding in your mouth. You vomit or cough up brown material. You get a nosebleed after you start a new medicine. Get help right away if: You have a nosebleed after you fall or hurt your head. Your nosebleed does not go away after 20 minutes. You feel dizzy or weak. You have unusual bleeding from other parts of your body. You have unusual bruising on other parts of your body. You get sweaty. You vomit blood. Summary Nosebleeds are common. They are usually not a sign of a serious medical problem. When you have a nosebleed, sit down and tilt your head a little forward. Pinch your nose with a clean tissue for 5 minutes. Use saline spray or saline gel and a humidifier as told by your doctor. Get help right away if your nosebleed does not go away after 20 minutes. This information is not intended to replace advice given to you by your health care provider. Make sure you discuss any questions you have with your health care provider. Document Revised: 03/11/2019 Document Reviewed: 03/11/2019 Elsevier Patient Education  2022 [employer] Managing Your Hypertension Hypertension, also called high blood pressure, is when the force of the blood pressing against the walls of the arteries is too strong. Arteries are blood vessels that carry blood from your heart throughout your body. Hypertension forces the heart to work  harder to pump blood and may cause the arteries to become narrow or stiff. Understanding blood pressure readings Your personal target blood pressure may vary depending on your medical conditions, your age, and other factors. A blood pressure reading includes a higher number over a lower number. Ideally, your blood pressure should be below 120/80. You should know that: The first, or top, number is called the systolic pressure. It is a measure of the pressure in your arteries as your  heart beats. The second, or bottom number, is called the diastolic pressure. It is a measure of the pressure in your arteries as the heart relaxes. Blood pressure is classified into four stages. Based on your blood pressure reading, your health care provider may use the following stages to determine what type of treatment you need, if any. Systolic pressure and diastolic pressure are measured in a unit called mmHg. Normal Systolic pressure: below 120. Diastolic pressure: below 80. Elevated Systolic pressure: 120-129. Diastolic pressure: below 80. Hypertension stage 1 Systolic pressure: 130-139. Diastolic pressure: 80-89. Hypertension stage 2 Systolic pressure: 140 or above. Diastolic pressure: 90 or above. How can this condition affect me? Managing your hypertension is an important responsibility. Over time, hypertension can damage the arteries and decrease blood flow to important parts of the body, including the brain, heart, and kidneys. Having untreated or uncontrolled hypertension can lead to: A heart attack. A stroke. A weakened blood vessel (aneurysm). Heart failure. Kidney damage. Eye damage. Metabolic syndrome. Memory and concentration problems. Vascular dementia. What actions can I take to manage this condition? Hypertension can be managed by making lifestyle changes and possibly by taking medicines. Your health care provider will help you make a plan to bring your blood pressure within a normal range. Nutrition  Eat a diet that is high in fiber and potassium, and low in salt (sodium), added sugar, and fat. An example eating plan is called the Dietary Approaches to Stop Hypertension (DASH) diet. To eat this way: Eat plenty of fresh fruits and vegetables. Try to fill one-half of your plate at each meal with fruits and vegetables. Eat whole grains, such as whole-wheat pasta, brown rice, or whole-grain bread. Fill about one-fourth of your plate with whole grains. Eat low-fat  dairy products. Avoid fatty cuts of meat, processed or cured meats, and poultry with skin. Fill about one-fourth of your plate with lean proteins such as fish, chicken without skin, beans, eggs, and tofu. Avoid pre-made and processed foods. These tend to be higher in sodium, added sugar, and fat. Reduce your daily sodium intake. Most people with hypertension should eat less than 1,500 mg of sodium a day. Lifestyle  Work with your health care provider to maintain a healthy body weight or to lose weight. Ask what an ideal weight is for you. Get at least 30 minutes of exercise that causes your heart to beat faster (aerobic exercise) most days of the week. Activities may include walking, swimming, or biking. Include exercise to strengthen your muscles (resistance exercise), such as weight lifting, as part of your weekly exercise routine. Try to do these types of exercises for 30 minutes at least 3 days a week. Do not use any products that contain nicotine or tobacco, such as cigarettes, e-cigarettes, and chewing tobacco. If you need help quitting, ask your health care provider. Control any long-term (chronic) conditions you have, such as high cholesterol or diabetes. Identify your sources of stress and find ways to manage stress. This may include meditation, deep breathing, or  making time for fun activities. Alcohol use Do not drink alcohol if: Your health care provider tells you not to drink. You are pregnant, may be pregnant, or are planning to become pregnant. If you drink alcohol: Limit how much you use to: 0-1 drink a day for women. 0-2 drinks a day for men. Be aware of how much alcohol is in your drink. In the [place] one drink equals one 12 oz bottle of beer (355 mL), one 5 oz glass of wine (148 mL), or one 1 oz glass of hard liquor (44 mL). Medicines Your health care provider may prescribe medicine if lifestyle changes are not enough to get your blood pressure under control and if: Your  systolic blood pressure is 130 or higher. Your diastolic blood pressure is 80 or higher. Take medicines only as told by your health care provider. Follow the directions carefully. Blood pressure medicines must be taken as told by your health care provider. The medicine does not work as well when you skip doses. Skipping doses also puts you at risk for problems. Monitoring Before you monitor your blood pressure: Do not smoke, drink caffeinated beverages, or exercise within 30 minutes before taking a measurement. Use the bathroom and empty your bladder (urinate). Sit quietly for at least 5 minutes before taking measurements. Monitor your blood pressure at home as told by your health care provider. To do this: Sit with your back straight and supported. Place your feet flat on the floor. Do not cross your legs. Support your arm on a flat surface, such as a table. Make sure your upper arm is at heart level. Each time you measure, take two or three readings one minute apart and record the results. You may also need to have your blood pressure checked regularly by your health care provider. General information Talk with your health care provider about your diet, exercise habits, and other lifestyle factors that may be contributing to hypertension. Review all the medicines you take with your health care provider because there may be side effects or interactions. Keep all visits as told by your health care provider. Your health care provider can help you create and adjust your plan for managing your high blood pressure. Where to find more information National Heart, Lung, and Blood Institute: PopSteam.is American Heart Association: www.heart.org Contact a health care provider if: You think you are having a reaction to medicines you have taken. You have repeated (recurrent) headaches. You feel dizzy. You have swelling in your ankles. You have trouble with your vision. Get help right away  if: You develop a severe headache or confusion. You have unusual weakness or numbness, or you feel faint. You have severe pain in your chest or abdomen. You vomit repeatedly. You have trouble breathing. These symptoms may represent a serious problem that is an emergency. Do not wait to see if the symptoms will go away. Get medical help right away. Call your local emergency services (911 in the [place] Do not drive yourself to the hospital. Summary Hypertension is when the force of blood pumping through your arteries is too strong. If this condition is not controlled, it may put you at risk for serious complications. Your personal target blood pressure may vary depending on your medical conditions, your age, and other factors. For most people, a normal blood pressure is less than 120/80. Hypertension is managed by lifestyle changes, medicines, or both. Lifestyle changes to help manage hypertension include losing weight, eating a healthy, low-sodium diet, exercising more,  stopping smoking, and limiting alcohol. This information is not intended to replace advice given to you by your health care provider. Make sure you discuss any questions you have with your health care provider. Document Revised: 06/18/2019 Document Reviewed: 04/13/2019 Elsevier Patient Education  2022 [employer].

## 2021-03-30 LAB — COMP. METABOLIC PANEL (12)
AST: 25 IU/L (ref 0–40)
Albumin/Globulin Ratio: 2 (ref 1.2–2.2)
Albumin: 4.8 g/dL (ref 4.0–5.0)
Alkaline Phosphatase: 79 IU/L (ref 44–121)
BUN/Creatinine Ratio: 15 (ref 9–20)
BUN: 15 mg/dL (ref 6–24)
Bilirubin Total: <0.2 mg/dL (ref 0.0–1.2)
Calcium: 9.2 mg/dL (ref 8.7–10.2)
Chloride: 102 mmol/L (ref 96–106)
Creatinine, Ser: 1.02 mg/dL (ref 0.76–1.27)
Globulin, Total: 2.4 g/dL (ref 1.5–4.5)
Glucose: 106 mg/dL — ABNORMAL HIGH (ref 70–99)
Potassium: 4.2 mmol/L (ref 3.5–5.2)
Sodium: 138 mmol/L (ref 134–144)
Total Protein: 7.2 g/dL (ref 6.0–8.5)
eGFR: 95 mL/min/{1.73_m2} (ref >59)

## 2021-03-30 LAB — LIPID PANEL
Chol/HDL Ratio: 3.7 ratio (ref 0.0–5.0)
Cholesterol, Total: 168 mg/dL (ref 100–199)
HDL: 45 mg/dL (ref >39)
LDL Chol Calc (NIH): 92 mg/dL (ref 0–99)
Triglycerides: 178 mg/dL — ABNORMAL HIGH (ref 0–149)
VLDL Cholesterol Cal: 31 mg/dL (ref 5–40)

## 2021-05-04 ENCOUNTER — Encounter (HOSPITAL_BASED_OUTPATIENT_CLINIC_OR_DEPARTMENT_OTHER): Payer: Self-pay | Admitting: Cardiology

## 2021-05-04 ENCOUNTER — Other Ambulatory Visit: Payer: Self-pay

## 2021-05-04 ENCOUNTER — Ambulatory Visit (INDEPENDENT_AMBULATORY_CARE_PROVIDER_SITE_OTHER): Payer: 59 | Admitting: Cardiology

## 2021-05-04 VITALS — BP 118/74 | HR 73 | Ht 73.0 in | Wt 244.0 lb

## 2021-05-04 DIAGNOSIS — Z9889 Other specified postprocedural states: Secondary | ICD-10-CM

## 2021-05-04 DIAGNOSIS — I10 Essential (primary) hypertension: Secondary | ICD-10-CM

## 2021-05-04 DIAGNOSIS — Z7189 Other specified counseling: Secondary | ICD-10-CM | POA: Diagnosis not present

## 2021-05-04 DIAGNOSIS — Z8679 Personal history of other diseases of the circulatory system: Secondary | ICD-10-CM

## 2021-05-04 NOTE — Patient Instructions (Signed)

## 2021-05-04 NOTE — Progress Notes (Signed)
Cardiology Office Note:    Date:  05/04/2021   ID:  Derrick Clayton, DOB 07-Oct-1980, MRN [id number]  PCP:  Barbette Merino, NP  Cardiologist:  Jodelle Red, MD  Referring MD: Barbette Merino, NP   Chief Complaint:  Follow up  History of Present Illness:    Derrick Clayton is a 40 y.o. male with a hx of hypertension, acute ascending aortic dissection s/p repair 02/2018.   Cardiac history: He presented to the ER 02/2018 with chest pain and hypertensive emergency. He had severe chest and foot pain, and he was found to have an ascending aortic dissection and no doppler flow to the left foot. Dissection also involved right carotid, celiac axis, SMA and bilateral iliac arteries. He was emergently taken for REPAIR OF ASCENDING AORTIC DISECTION USING STRAIGHT HEMASHIELD PLATINUM VASCULAR GRAFT; OPEN HEMIARCH DISTAL ANASTOMOSIS AND RESUSPENSION OF THE NATIVE AORTIC VALVE. His hospitalization was notable for possible atrial tachycardia vs. Flutter, but risk was considered to outweigh benefit of anticoagulation. He presented to the ER on 03/23/18 for peripheral leg edema and had lasix increased for a week.   Today: Overall he is feeling alright, but notes having some ups and downs. At work yesterday he felt a little dizzy, but denies any syncope. When he feels dizzy he rests and calms himself, as he states it may be related to anxiety.  Recently he had an episode of epistaxis that was persistently intermittent over a period of 6 weeks.  Typically he does not monitor his blood pressure at home.  He has successfully quit smoking.   He remains compliant with his medications and is tolerating them well. Every morning he is also taking fish oil supplements and Bayer aspirin.  In 06/2021 he will follow-up with his PCP.  He denies any palpitations, chest pain, or shortness of breath. No headaches, syncope, orthopnea, PND, lower extremity edema or exertional symptoms.   Past Medical History:   Diagnosis Date   Acute thoracic aortic dissection 03/10/2018   COVID 04/2020   Hypertension    S/P aortic dissection repair 03/11/2018   Straight graft replacement of ascending thoracic aorta with resuspension of native aortic valve and open hemi-arch distal anastomosis   Past Surgical History:  Procedure Laterality Date   AORTIC INTERVENTION N/A 03/10/2018   Procedure: RESUSPENSION OF THE NATIVE AORTIC VALVE;  Surgeon: Purcell Nails, MD;  Location: [facility] OR;  Service: Open Heart Surgery;  Laterality: N/A;   AORTIC VALVE REPAIR N/A 03/10/2018   Procedure: REPAIR OF ASCENDING AORTIC DISECTION USING STRAIGHT HEMASHIELD PLATINUM VASCULAR GRAFT; OPEN HEMIARCH DISTAL ANASTOMOSIS;  Surgeon: Purcell Nails, MD;  Location: [facility] OR;  Service: Open Heart Surgery;  Laterality: N/A;   FOOT SURGERY       Current Meds  Medication Sig   amLODipine (NORVASC) 10 MG tablet Take 1 tablet (10 mg total) by mouth daily.   aspirin EC 81 MG tablet Take 81 mg by mouth daily.   lisinopril (ZESTRIL) 20 MG tablet Take 1.5 tablets (30 mg total) by mouth daily.   metoprolol (TOPROL-XL) 200 MG 24 hr tablet TAKE 1 TABLET BY MOUTH ONCE DAILY WITH MEALS OR  IMMEDIATELY  FOLLOWING  A  MEAL   Omega-3 Fatty Acids (FISH OIL) 1000 MG CAPS Take by mouth.     Allergies:   Patient has no known allergies.   Social History   Tobacco Use   Smoking status: Former    Packs/day: 0.00    Types: Cigarettes  Smokeless tobacco: Never  Vaping Use   Vaping Use: Never used  Substance Use Topics   Alcohol use: Not Currently    Comment: 3-4 days a week   Drug use: Yes    Types: Marijuana    Comment: daily     Family Hx: The patient's family history includes Cancer in his mother; Heart failure in his father.  ROS:   Please see the history of present illness.    (+) Stress/Anxiety (+) Dizziness (+) Epistaxis All other systems reviewed and are negative.   Prior CV studies:   The following studies were reviewed  today:  CTA 03/15/19 with stable dissection measurements, stable post op measurements  Echo 03/11/2018:  Septum: No Patent Foramen Ovale present.   Left atrium: Patent foramen ovale not present.   Aortic valve: The valve is trileaflet. Mild to moderate regurgitation.   Aorta: Dissection present.   Mitral valve: Trace regurgitation.    Labs/Other Tests and Data Reviewed:    EKG:  EKG is personally reviewed. 05/04/2021: Sinus rhythm. Rate 73 bpm. 03/13/2020: NSR at 61 bpm.  Recent Labs: 03/29/2021: BUN 15; Creatinine, Ser 1.02; Potassium 4.2; Sodium 138   Recent Lipid Panel Lab Results  Component Value Date/Time   CHOL 168 03/29/2021 09:37 AM   TRIG 178 (H) 03/29/2021 09:37 AM   HDL 45 03/29/2021 09:37 AM   CHOLHDL 3.7 03/29/2021 09:37 AM   LDLCALC 92 03/29/2021 09:37 AM    Wt Readings from Last 3 Encounters:  05/04/21 244 lb (110.7 kg)  03/29/21 248 lb 3.2 oz (112.6 kg)  10/05/20 248 lb (112.5 kg)     Objective:    Vital Signs:  BP 118/74 (BP Location: Left Arm, Patient Position: Sitting, Cuff Size: Large)   Pulse 73   Ht 6\' 1"  (1.854 m)   Wt 244 lb (110.7 kg)   BMI 32.19 kg/m    GEN: Well nourished, well developed in no acute distress HEENT: Normal, moist mucous membranes NECK: No JVD CARDIAC: regular rhythm, normal S1 and S2, no rubs or gallops. No murmur. VASCULAR: Radial and DP pulses 2+ bilaterally. No carotid bruits RESPIRATORY:  Clear to auscultation without rales, wheezing or rhonchi  ABDOMEN: Soft, non-tender, non-distended MUSCULOSKELETAL:  Ambulates independently SKIN: Warm and dry, no edema NEUROLOGIC:  Alert and oriented x 3. No focal neuro deficits noted. PSYCHIATRIC:  Normal affect   ASSESSMENT & PLAN:    1. History of aortic dissection   2. S/P repair ascending aortic dissection   3. Essential hypertension   4. Cardiac risk counseling    Type A aortic dissection, s/p repair Hypertension -blood pressure control much  improved -continue metoprolol succinate 200 mg daily.  -on lisinopril 30 mg daily -on amlodipine 10 mg -see counseling on smoking, aspirin, statin as below   Tobacco cessation: has quit smoking, congratulated  CV risk counseling and prevention: -recommend heart healthy/Mediterranean diet, with whole grains, fruits, vegetable, fish, lean meats, nuts, and olive oil. Limit salt. -recommend moderate walking, 3-5 times/week for 30-50 minutes each session. Aim for at least 150 minutes.week. Goal should be pace of 3 miles/hours, or walking 1.5 miles in 30 minutes -recommend avoidance of tobacco products. Avoid excess alcohol. -ASCVD risk score: The 10-year ASCVD risk score (Arnett DK, et al., 2019) is: 4.6%   Values used to calculate the score:     Age: 18 years     Sex: Male     Is Non-Hispanic African American: Yes     Diabetic: No  Tobacco smoker: No     Systolic Blood Pressure: 118 mmHg     Is BP treated: Yes     HDL Cholesterol: 45 mg/dL     Total Cholesterol: 168 mg/dL  Taking aspirin 81 mg We have discussed statins on multiple occasions, declines at this time  Follow up:   He will see his PCP in 06/2021, we will follow up in 6 mos.  Orders Placed This Encounter  Procedures   EKG 12-Lead    Patient Instructions  Medication Instructions:  Your Physician recommend you continue on your current medication as directed.    *If you need a refill on your cardiac medications before your next appointment, please call your pharmacy*   Lab Work: None ordered today   Testing/Procedures: None ordered today   Follow-Up: At [facility], you and your health needs are our priority.  As part of our continuing mission to provide you with exceptional heart care, we have created designated Provider Care Teams.  These Care Teams include your primary Cardiologist (physician) and Advanced Practice Providers (APPs -  Physician Assistants and Nurse Practitioners) who all work together to  provide you with the care you need, when you need it.  We recommend signing up for the patient portal called "MyChart".  Sign up information is provided on this After Visit Summary.  MyChart is used to connect with patients for Virtual Visits (Telemedicine).  Patients are able to view lab/test results, encounter notes, upcoming appointments, etc.  Non-urgent messages can be sent to your provider as well.   To learn more about what you can do with MyChart, go to [email or web address].    Your next appointment:   6 month(s)  The format for your next appointment:   In Person  Provider:   Jodelle Red, MD     Derrick Clayton,acting as a scribe for Jodelle Red, MD.,have documented all relevant documentation on the behalf of Jodelle Red, MD,as directed by  Jodelle Red, MD while in the presence of Jodelle Red, MD.  I, Jodelle Red, MD, have reviewed all documentation for this visit. The documentation on 07/13/21 for the exam, diagnosis, procedures, and orders are all accurate and complete.   Signed, Jodelle Red, MD  05/04/2021 2:02 PM    [facility]

## 2021-06-22 ENCOUNTER — Other Ambulatory Visit: Payer: Self-pay | Admitting: Nurse Practitioner

## 2021-06-29 ENCOUNTER — Encounter: Payer: Self-pay | Admitting: Nurse Practitioner

## 2021-06-29 ENCOUNTER — Ambulatory Visit (INDEPENDENT_AMBULATORY_CARE_PROVIDER_SITE_OTHER): Payer: 59 | Admitting: Nurse Practitioner

## 2021-06-29 ENCOUNTER — Other Ambulatory Visit: Payer: Self-pay

## 2021-06-29 VITALS — BP 143/75 | HR 72 | Temp 98.0°F | Ht 73.0 in | Wt 248.2 lb

## 2021-06-29 DIAGNOSIS — I10 Essential (primary) hypertension: Secondary | ICD-10-CM

## 2021-06-29 DIAGNOSIS — Z789 Other specified health status: Secondary | ICD-10-CM | POA: Diagnosis not present

## 2021-06-29 DIAGNOSIS — J069 Acute upper respiratory infection, unspecified: Secondary | ICD-10-CM

## 2021-06-29 DIAGNOSIS — R059 Cough, unspecified: Secondary | ICD-10-CM

## 2021-06-29 MED ORDER — DOXYCYCLINE HYCLATE 100 MG PO CAPS: 100.0000 mg | ORAL_CAPSULE | Freq: Two times a day (BID) | ORAL | 0 refills | Status: AC

## 2021-06-29 NOTE — Progress Notes (Signed)
[place], [place]  [address] Phone:  [phone]   Fax:  [phone]   Established Patient Office Visit  Subjective:  Patient ID: Derrick Clayton, male    DOB: Oct 01, 1980  Age: 41 y.o. MRN: [id number]  CC:  Chief Complaint  Patient presents with   Follow-up    Pt is here today for his 3 month follow up visit. Pt states that his face has been breaking out and and he is not sure what is causing it.  Pt states that he had a cough and that has subsided but he still has drainage in his throat to where he is constantly spitting up mucus all day.    HPI Doctor Sheahan presents for   Rash Patient presents for evaluation of a rash involving the face. Rash started several days ago. Lesions are dark, and raised in texture. Rash has changed over time. Rash is painful and is pruritic. Associated symptoms: none. Patient denies: abdominal pain, arthralgia, congestion, cough, fever, headache, nausea, sore throat, and vomiting. Patient has not had contacts with similar rash. Patient has had new exposures (soaps, lotions, laundry detergents, foods, medications, plants, insects or animals).  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  He has been exposed to mucous. He has taken Mucinex and Coricidin which have not been effective.  He denies fever or chills.  He denies previous history of allergies.   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 and open hemi-arch distal anastomosis    Past Surgical History:  Procedure Laterality Date   AORTIC INTERVENTION N/A 03/10/2018   Procedure: RESUSPENSION OF THE NATIVE AORTIC VALVE;  Surgeon: Rexene Alberts, MD;  Location: [facility];  Service: Open Heart Surgery;   Laterality: N/A;   AORTIC VALVE REPAIR N/A 03/10/2018   Procedure: REPAIR OF ASCENDING AORTIC DISECTION USING STRAIGHT HEMASHIELD PLATINUM 26MM VASCULAR GRAFT; OPEN HEMIARCH DISTAL ANASTOMOSIS;  Surgeon: Rexene Alberts, MD;  Location: [facility];  Service: Open Heart Surgery;  Laterality: N/A;   FOOT SURGERY      Family History  Problem Relation Age of Onset   Cancer Mother    Heart failure Father     Social History   Socioeconomic History   Marital status: Single    Spouse name: Not on file   Number of children: Not on file   Years of education: Not on file   Highest education level: Not on file  Occupational History   Not on file  Tobacco Use   Smoking status: Former    Packs/day: 0.00    Types: Cigarettes   Smokeless tobacco: Never  Vaping Use   Vaping Use: Never used  Substance and Sexual Activity   Alcohol use: Yes    Comment: 3-4 days a week   Drug use: Yes    Types: Marijuana    Comment: daily   Sexual activity: Not on file  Other Topics Concern   Not on file  Social History Narrative   Not on file   Social Determinants of Health   Financial Resource Strain: Not on file  Food Insecurity: Not on file  Transportation Needs: Not on file  Physical Activity: Not on file  Stress: Not  on file  Social Connections: Not on file  Intimate Partner Violence: Not on file    Outpatient Medications Prior to Visit  Medication Sig Dispense Refill   amLODipine (NORVASC) 10 MG tablet Take 1 tablet by mouth once daily 90 tablet 0   aspirin EC 81 MG tablet Take 81 mg by mouth daily.     lisinopril (ZESTRIL) 20 MG tablet Take 1.5 tablets (30 mg total) by mouth daily. 90 tablet 1   metoprolol (TOPROL-XL) 200 MG 24 hr tablet TAKE 1 TABLET BY MOUTH ONCE DAILY WITH MEALS OR  IMMEDIATELY  FOLLOWING  A  MEAL 30 tablet 11   Omega-3 Fatty Acids (FISH OIL) 1000 MG CAPS Take by mouth.     No facility-administered medications prior to visit.    No Known Allergies  ROS Review of  Systems    Objective:    Physical Exam Constitutional:      General: He is not in acute distress.    Appearance: He is obese. He is not ill-appearing, toxic-appearing or diaphoretic.  HENT:     Head: Normocephalic and atraumatic.     Nose: Nose normal.     Mouth/Throat:     Mouth: Mucous membranes are moist.  Cardiovascular:     Rate and Rhythm: Normal rate and regular rhythm.     Pulses: Normal pulses.     Heart sounds: Normal heart sounds.  Pulmonary:     Effort: Pulmonary effort is normal.     Breath sounds: Normal breath sounds.  Abdominal:     General: Bowel sounds are normal.     Palpations: Abdomen is soft.     Comments: Increased abdominal girth   Musculoskeletal:        General: Normal range of motion.     Cervical back: Normal range of motion.  Skin:    General: Skin is warm and dry.     Capillary Refill: Capillary refill takes less than 2 seconds.     Findings: Erythema and rash (raised area with en,laraging of facial pores) present.  Neurological:     General: No focal deficit present.     Mental Status: He is alert and oriented to person, place, and time.  Psychiatric:        Mood and Affect: Mood normal.        Behavior: Behavior normal.        Thought Content: Thought content normal.        Judgment: Judgment normal.    BP (!) 143/75    Pulse 72    Temp 98 F (36.7 C)    Ht _0  (1.854 m)    Wt 248 lb 3.2 oz (112.6 kg)    SpO2 98%    BMI 32.75 kg/m  Wt Readings from Last 3 Encounters:  06/29/21 248 lb 3.2 oz (112.6 kg)  05/04/21 244 lb (110.7 kg)  03/29/21 248 lb 3.2 oz (112.6 kg)     There are no preventive care reminders to display for this patient.  There are no preventive care reminders to display for this patient.  No results found for: TSH Lab Results  Component Value Date   WBC 5.5 10/05/2019   HGB 16.0 10/05/2019   HCT 45.7 10/05/2019   MCV 87 10/05/2019   PLT 316 10/05/2019   Lab Results  Component Value Date   NA 138  03/29/2021   K 4.2 03/29/2021   CO2 22 03/03/2019   GLUCOSE 106 (H) 03/29/2021   BUN  15 03/29/2021   CREATININE 1.02 03/29/2021   BILITOT <0.2 03/29/2021   ALKPHOS 79 03/29/2021   AST 25 03/29/2021   ALT 40 03/10/2018   PROT 7.2 03/29/2021   ALBUMIN 4.8 03/29/2021   CALCIUM 9.2 03/29/2021   ANIONGAP 8 03/23/2018   EGFR 95 03/29/2021   Lab Results  Component Value Date   CHOL 168 03/29/2021   Lab Results  Component Value Date   HDL 45 03/29/2021   Lab Results  Component Value Date   LDLCALC 92 03/29/2021   Lab Results  Component Value Date   TRIG 178 (H) 03/29/2021   Lab Results  Component Value Date   CHOLHDL 3.7 03/29/2021   Lab Results  Component Value Date   HGBA1C 5.3 03/23/2018      Assessment & Plan:   Problem List Items Addressed This Visit       Cardiovascular and Mediastinum   Essential hypertension - Primary (Chronic) Stable Encouraged on going compliance with current medication regimen Encouraged home monitoring and recording BP <130/80 Eating a heart-healthy diet with less salt Encouraged regular physical activity  Recommend Weight loss     Relevant Orders   Comp. Metabolic Panel (12)   Other Visit Diagnoses     Cough, unspecified type       Upper respiratory tract infection, unspecified type  Doxycycline      Allergy history unknown     Facial irritation; doxycycline   Relevant Orders   Allergen Profile, Food-Meat   Allergen, Cheese Parmesan   Allergen, Cheese Swiss   Allergen, Cheese, Cheddar,f81   CBC with Differential/Platelet   Allergen Gluten f79   Allergy-Shellfish Panel       Meds ordered this encounter  Medications   doxycycline (VIBRAMYCIN) 100 MG capsule    Sig: Take 1 capsule (100 mg total) by mouth 2 (two) times daily for 7 days.    Dispense:  14 capsule    Refill:  0    Order Specific Question:   Supervising Provider    Answer:   Tresa Garter [[id number]]    Follow-up: No follow-ups on file.     Vevelyn Francois, NP

## 2021-06-30 ENCOUNTER — Encounter: Payer: Self-pay | Admitting: Nurse Practitioner

## 2021-07-04 LAB — ALLERGEN PROFILE, FOOD-MEAT
Beef IgE: <0.1 kU/L
Chicken IgE: <0.1 kU/L
Pork IgE: <0.1 kU/L

## 2021-07-04 LAB — COMP. METABOLIC PANEL (12)
AST: 23 IU/L (ref 0–40)
Albumin/Globulin Ratio: 1.4 (ref 1.2–2.2)
Albumin: 4.3 g/dL (ref 4.0–5.0)
Alkaline Phosphatase: 79 IU/L (ref 44–121)
BUN/Creatinine Ratio: 12 (ref 9–20)
BUN: 14 mg/dL (ref 6–24)
Bilirubin Total: 0.4 mg/dL (ref 0.0–1.2)
Calcium: 9.6 mg/dL (ref 8.7–10.2)
Chloride: 103 mmol/L (ref 96–106)
Creatinine, Ser: 1.14 mg/dL (ref 0.76–1.27)
Globulin, Total: 3 g/dL (ref 1.5–4.5)
Glucose: 104 mg/dL — ABNORMAL HIGH (ref 70–99)
Potassium: 4.7 mmol/L (ref 3.5–5.2)
Sodium: 139 mmol/L (ref 134–144)
Total Protein: 7.3 g/dL (ref 6.0–8.5)
eGFR: 83 mL/min/{1.73_m2} (ref >59)

## 2021-07-04 LAB — CBC WITH DIFFERENTIAL/PLATELET
Basophils Absolute: 0.1 10*3/uL (ref 0.0–0.2)
Basos: 1 %
EOS (ABSOLUTE): 0.1 10*3/uL (ref 0.0–0.4)
Eos: 2 %
Hematocrit: 45.7 % (ref 37.5–51.0)
Hemoglobin: 15.4 g/dL (ref 13.0–17.7)
Immature Grans (Abs): 0 10*3/uL (ref 0.0–0.1)
Immature Granulocytes: 0 %
Lymphocytes Absolute: 2.2 10*3/uL (ref 0.7–3.1)
Lymphs: 33 %
MCH: 29.8 pg (ref 26.6–33.0)
MCHC: 33.7 g/dL (ref 31.5–35.7)
MCV: 88 fL (ref 79–97)
Monocytes Absolute: 0.7 10*3/uL (ref 0.1–0.9)
Monocytes: 11 %
Neutrophils Absolute: 3.4 10*3/uL (ref 1.4–7.0)
Neutrophils: 53 %
Platelets: 310 10*3/uL (ref 150–450)
RBC: 5.17 x10E6/uL (ref 4.14–5.80)
RDW: 12.5 % (ref 11.6–15.4)
WBC: 6.5 10*3/uL (ref 3.4–10.8)

## 2021-07-04 LAB — ALLERGEN GLUTEN F79: Allergen Gluten IgE: <0.1 kU/L

## 2021-07-04 LAB — NOTE:

## 2021-07-04 LAB — GLUTEN SENSITIVITY SCREEN: tTG/DGP Screen: POSITIVE — AB

## 2021-07-04 LAB — ALLERGEN, CHEESE, CHEDDAR,F81: F081-IgE Cheese, Cheddar Type: <0.1 kU/L

## 2021-07-13 NOTE — Progress Notes (Signed)
MyChart message sent to the patient. However additional follow up maybe needed. Thanks ° °

## 2021-08-20 ENCOUNTER — Other Ambulatory Visit: Payer: Self-pay | Admitting: Nurse Practitioner

## 2021-08-20 ENCOUNTER — Telehealth: Payer: Self-pay | Admitting: Nurse Practitioner

## 2021-08-20 NOTE — Telephone Encounter (Signed)
Antibiotic request 

## 2021-08-20 NOTE — Telephone Encounter (Signed)
Pt called requesting another prescription of antibiotic. Also requests Crystal or Morrie Sheldon to call him. [phone] ?

## 2021-08-21 ENCOUNTER — Other Ambulatory Visit: Payer: Self-pay

## 2021-08-21 DIAGNOSIS — Z789 Other specified health status: Secondary | ICD-10-CM

## 2021-08-22 ENCOUNTER — Ambulatory Visit (INDEPENDENT_AMBULATORY_CARE_PROVIDER_SITE_OTHER): Payer: 59 | Admitting: Nurse Practitioner

## 2021-08-22 ENCOUNTER — Other Ambulatory Visit: Payer: Self-pay

## 2021-08-22 ENCOUNTER — Other Ambulatory Visit: Payer: Self-pay | Admitting: Nurse Practitioner

## 2021-08-22 ENCOUNTER — Encounter: Payer: Self-pay | Admitting: Nurse Practitioner

## 2021-08-22 VITALS — BP 135/73 | HR 70 | Temp 98.8°F | Ht 73.0 in | Wt 245.8 lb

## 2021-08-22 DIAGNOSIS — L0201 Cutaneous abscess of face: Secondary | ICD-10-CM | POA: Diagnosis not present

## 2021-08-22 DIAGNOSIS — R0683 Snoring: Secondary | ICD-10-CM

## 2021-08-22 MED ORDER — DOXYCYCLINE HYCLATE 100 MG PO TABS: 100.0000 mg | ORAL_TABLET | Freq: Two times a day (BID) | ORAL | 0 refills | Status: AC

## 2021-08-22 NOTE — Patient Instructions (Addendum)
1. Cutaneous abscess of face ? ?- doxycycline (VIBRA-TABS) 100 MG tablet; Take 1 tablet (100 mg total) by mouth 2 (two) times daily for 10 days.  Dispense: 20 tablet; Refill: 0 ? ?2. 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 ?Referral placed to allergy yesterday ? ?Zyrtec ? ? ?Follow up: ? ?Follow up if needed ? ? ?Gluten-Free Diet for Celiac Disease, Adult ?The gluten-free diet includes all foods that do not contain gluten. Gluten is a protein that is found in wheat, rye, barley, and some other grains. Following the gluten-free diet is the only treatment for people with celiac disease. It helps to prevent damage to the intestines and improves or eliminates the symptoms of celiac disease. ?Following the gluten-free diet requires some planning. It can be challenging at first, but it gets easier with time and practice. There are more gluten-free options available today than ever before. If you need help finding gluten-free foods or if you have questions, talk with your dietitian or your health care provider. ?What are tips for following this plan? ?Reading food labels ?Read all food labels. Gluten is often added to foods. Always check the ingredient list and look for warnings, such as "may contain gluten." Foods that list any of these key words on the label usually contain gluten: ?Wheat, flour, enriched flour, bromated flour, white flour, durum flour, graham flour, phosphated flour, self-rising flour, semolina, farina, barley (malt), rye, and oats. ?Starch, dextrin, modified food starch, or cereal. ?Thickening, fillers, or emulsifiers. ?Malt flavoring, malt extract, or malt syrup. ?Hydrolyzed vegetable protein. ?In the [place] packaged foods that are gluten-free are required to be labeled "GF." These foods should be easy to identify and are safe to eat. In the [place] food companies are also required to list common food allergens, including wheat, on their labels. ?Shopping ?When grocery shopping, start by shopping in the produce, meat, and  dairy sections. These sections are more likely to contain gluten-free foods. Then move to the aisles that contain packaged foods if you need to. ?Meal planning ?All fruits, vegetables, and meats are safe to eat and do not contain gluten. ?Talk with your dietitian or health care provider before taking a gluten-free multivitamin or mineral supplement. ?Be aware of gluten-free foods having contact with foods that contain gluten (cross-contamination). This can happen at home and with any processed foods. ?Talk with your health care provider or dietitian about how to reduce the risk of cross-contamination in your home. ?If you have questions about how a food is processed, ask the manufacturer. ?What foods can I eat? ?Fruits ?All plain fresh, frozen, canned, and dried fruits, and 100% fruit juices. ?Vegetables ?All plain fresh, frozen, and canned vegetables. ?Grains ?Amaranth, bean flours, 100% buckwheat flour, corn, millet, nut flours or nut meals, GF oats, quinoa, rice, sorghum, teff, rice wafers, pure cornmeal tortillas, popcorn, and hot cereals made from cornmeal.  ?Hominy, rice, wild rice. Some Asian rice noodles or bean noodles.  ?Arrowroot starch, corn bran, corn flour, corn germ, cornmeal, corn starch, potato flour, potato starch flour, and rice bran. Plain, brown, and sweet rice flours. Rice polish, soy flour, and tapioca starch. ?Meats and other protein foods ?All fresh beef, pork, poultry, fish, seafood, and eggs. Fish canned in water, oil, brine, or vegetable broth. Plain nuts and seeds, peanut butter.  ?Some precooked or cured meat, such as sausages or meat loaves. Some frankfurters. Dried beans, dried peas, and lentils. ?Dairy ?Fresh plain, dry, evaporated, or condensed milk. Cream, butter, sour cream, whipping cream, and most yogurts.  Unprocessed cheese, most processed cheeses, some cottage cheeses, some cream cheeses. ?Beverages ?Coffee, tea, most herbal teas. Carbonated beverages and some root beers.  Wine, sake, and distilled spirits, such as gin, vodka, and whiskey. Most hard ciders. ?Fats and oils ?Butter, margarine, vegetable oil, hydrogenated butter, olive oil, shortening, lard, cream, and some mayonnaise. Some commercial salad dressings. Olives. ?Sweets and desserts ?Sugar, honey, some syrups, molasses, jelly, and jam. Plain hard candy, marshmallows, and gumdrops.  ?Pure cocoa powder. Plain chocolate. Custard and some pudding mixes. Gelatin desserts, sorbets, frozen ice pops, and sherbet.  ?Cake, cookies, and other desserts prepared with allowed flours. Some commercial ice creams. Cornstarch, tapioca, and rice puddings. ?Seasoning and other foods ?Some canned or frozen soups. Monosodium glutamate (MSG). Cider, rice, and wine vinegar. Baking soda and baking powder.  ?Cream of tartar. Baking and nutritional yeast. Certain soy sauces made without wheat (ask your dietitian about specific brands that are allowed).  ?Nuts, coconut, and chocolate. Salt, pepper, herbs, spices, flavoring extracts, imitation or artificial flavorings, natural flavorings, and food colorings.  ?Some medicines and supplements. Rice syrups. ?The items listed above may not be a complete list of foods and beverages you can eat. Contact a dietitian for more information. ?What foods should I avoid? ?Fruits ?Thickened or prepared fruits and some pie fillings. Some fruit snacks and fruit roll-ups. ?Vegetables ?Most creamed vegetables and most vegetables canned in sauces. Some commercially prepared vegetables and salads. Vegetables in a soy sauce marinade or dressing. ?Grains ?Barley, bran, bulgur, couscous, cracked wheat, [place], farro, graham, malt, matzo, semolina, wheat germ, and all wheat and rye cereals including spelt and kamut.  ?Cereals containing malt as a flavoring, such as rice cereal. Noodles, spaghetti, macaroni, most packaged rice mixes, and all mixes containing wheat, rye, barley, or triticale. ?Meats and other protein foods ?Any  meat or meat alternative containing wheat, rye, barley, or gluten stabilizers. These are often marinated or packaged meats, and precooked or cured meat, such as sausages or meat loaves. ?Bread-containing products, such as Swiss steak, croquettes, meatballs, and meatloaf. Most tuna canned in vegetable broth and [place] with hydrolyzed vegetable protein (HVP) injected as part of the basting. ?Seitan. Imitation fish. Eggs in sauces made from ingredients to avoid. ?Dairy ?Commercial chocolate milk drinks and malted milk. Some non-dairy creamers. Any cheese product containing ingredients to avoid. ?Beverages ?Certain cereal beverages. Beer, ale, malted milk, and some root beers. Some hard ciders. Some instant flavored coffees. Some herbal teas made with barley or with barley malt added. ?Fats and oils ?Some commercial salad dressings. Sour cream containing modified food starch. ?Sweets and desserts ?Some toffees. Chocolate-coated nuts (may be rolled in wheat flour) and some commercial candies and candy bars.  ?Most cakes, cookies, donuts, pastries, and other baked goods. Some commercial ice cream. Ice cream cones.  ?Commercially prepared mixes for cakes, cookies, and other desserts. Bread pudding and other puddings thickened with flour.  ?Products containing brown rice syrup made with barley malt enzyme. Desserts and sweets made with malt flavoring. ?Seasoning and other foods ?Some curry powders, some dry seasoning mixes, some gravy extracts, some meat sauces, some ketchups, some prepared mustards, and horseradish.  ?Certain soy sauces. Malt vinegar. Bouillon and bouillon cubes that contain HVP. Some chip dips, and some chewing gum.  ?Yeast extract. Brewer's yeast. Caramel color.  ?Some medicines and supplements. ?The items listed above may not be a complete list of foods and beverages you should avoid. Contact a dietitian for more information. ?Summary ?Gluten is a protein  that is found in wheat, rye, barley, and some  other grains. The gluten-free diet includes all foods that do not contain gluten. ?If you need help finding gluten-free foods or if you have questions, talk with your dietitian or your health care provider

## 2021-08-22 NOTE — Assessment & Plan Note (Signed)
-   doxycycline (VIBRA-TABS) 100 MG tablet; Take 1 tablet (100 mg total) by mouth 2 (two) times daily for 10 days.  Dispense: 20 tablet; Refill: 0 ? ?2. 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 ?Referral placed to allergy yesterday ? ?Zyrtec ? ? ?Follow up: ? ?Follow up if needed ?

## 2021-08-22 NOTE — Progress Notes (Signed)
@Patient  ID: , male    DOB: Dec 17, 1980, 41 y.o.   MRN: [id number] ? ?Chief Complaint  ?Patient presents with  ? Allergic Reaction  ?  Pt is here for allergic reaction to pasta pt stated he is allergic to glutin  ? ? ?Referring provider: ?[id number], NP ? ? ?HPI ? ?41 year old male with has a past medical history of Acute thoracic aortic dissection (03/10/2018), COVID (04/2020), Hypertension, and S/P aortic dissection repair (03/11/2018).  ? ?Patient presents today for allergic reaction. He states that he is allergic to gluten and ate pasta this weekend. He states that his face broke out after eating the pasta. Today there is one area to his forehead. This appears to be an abscess rather than hives. We discussed that we will refer patient to an allergy specialist. Recent allergy testing at this office showed: positive for gluten sensitivity. Denies f/c/s, n/v/d, hemoptysis, PND, chest pain or edema. ? ? ? ? ? ?No Known Allergies ? ?Immunization History  ?Administered Date(s) Administered  ? Td 10/25/2000  ? Tdap 04/17/2018  ? ? ?Past Medical History:  ?Diagnosis Date  ? Acute thoracic aortic dissection 03/10/2018  ? COVID 04/2020  ? Hypertension   ? S/P aortic dissection repair 03/11/2018  ? Straight graft replacement of ascending thoracic aorta with resuspension of native aortic valve and open hemi-arch distal anastomosis  ? ? ?Tobacco History: ?Social History  ? ?Tobacco Use  ?Smoking Status Former  ? Packs/day: 0.00  ? Types: Cigarettes  ?Smokeless Tobacco Never  ? ?Counseling given: Not Answered ? ? ?Outpatient Encounter Medications as of 08/22/2021  ?Medication Sig  ? doxycycline (VIBRA-TABS) 100 MG tablet Take 1 tablet (100 mg total) by mouth 2 (two) times daily for 10 days.  ? amLODipine (NORVASC) 10 MG tablet Take 1 tablet by mouth once daily  ? aspirin EC 81 MG tablet Take 81 mg by mouth daily.  ? lisinopril (ZESTRIL) 20 MG tablet Take 1.5 tablets (30 mg total) by mouth daily.  ? metoprolol  (TOPROL-XL) 200 MG 24 hr tablet TAKE 1 TABLET BY MOUTH ONCE DAILY WITH MEALS OR  IMMEDIATELY  FOLLOWING  A  MEAL  ? Omega-3 Fatty Acids (FISH OIL) 1000 MG CAPS Take by mouth.  ? ?No facility-administered encounter medications on file as of 08/22/2021.  ? ? ? ?Review of Systems ? ?Review of Systems  ?Constitutional: Negative.   ?HENT: Negative.    ?Cardiovascular: Negative.   ?Gastrointestinal: Negative.   ?Skin:   ?     Skin irritation to forehead  ?Allergic/Immunologic: Negative.   ?Neurological: Negative.   ?Psychiatric/Behavioral: Negative.     ? ? ? ?Physical Exam ? ?BP 135/73 (BP Location: Right Arm, Patient Position: Sitting, Cuff Size: Large)   Pulse 70   Temp 98.8 ?F (37.1 ?C)   Ht 6\' 1"  (1.854 m)   Wt 245 lb 12.8 oz (111.5 kg)   SpO2 100%   BMI 32.43 kg/m?  ? ?Wt Readings from Last 5 Encounters:  ?08/22/21 245 lb 12.8 oz (111.5 kg)  ?06/29/21 248 lb 3.2 oz (112.6 kg)  ?05/04/21 244 lb (110.7 kg)  ?03/29/21 248 lb 3.2 oz (112.6 kg)  ?10/05/20 248 lb (112.5 kg)  ? ? ? ?Physical Exam ?Vitals and nursing note reviewed.  ?Constitutional:   ?   General: He is not in acute distress. ?   Appearance: He is well-developed.  ?Cardiovascular:  ?   Rate and Rhythm: Normal rate and regular rhythm.  ?Pulmonary:  ?  Effort: Pulmonary effort is normal.  ?   Breath sounds: Normal breath sounds.  ?Skin: ?   General: Skin is warm and dry.  ?   Comments: Small cutaneous abscess to forehead.   ?Neurological:  ?   Mental Status: He is alert and oriented to person, place, and time.  ? ? ? ? ?Assessment & Plan:  ? ?Cutaneous abscess of face ?- doxycycline (VIBRA-TABS) 100 MG tablet; Take 1 tablet (100 mg total) by mouth 2 (two) times daily for 10 days.  Dispense: 20 tablet; Refill: 0 ? ?2. 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 ?Referral placed to allergy yesterday ? ?Zyrtec ? ? ?Follow up: ? ?Follow up if needed ? ?Patient Instructions  ?1. Cutaneous abscess of face ? ?- doxycycline (VIBRA-TABS) 100 MG tablet; Take 1 tablet (100 mg total) by  mouth 2 (two) times daily for 10 days.  Dispense: 20 tablet; Refill: 0 ? ?2. 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 ?Referral placed to allergy yesterday ? ?Zyrtec ? ? ?Follow up: ? ?Follow up if needed ? ? ?Gluten-Free Diet for Celiac Disease, Adult ?The gluten-free diet includes all foods that do not contain gluten. Gluten is a protein that is found in wheat, rye, barley, and some other grains. Following the gluten-free diet is the only treatment for people with celiac disease. It helps to prevent damage to the intestines and improves or eliminates the symptoms of celiac disease. ?Following the gluten-free diet requires some planning. It can be challenging at first, but it gets easier with time and practice. There are more gluten-free options available today than ever before. If you need help finding gluten-free foods or if you have questions, talk with your dietitian or your health care provider. ?What are tips for following this plan? ?Reading food labels ?Read all food labels. Gluten is often added to foods. Always check the ingredient list and look for warnings, such as "may contain gluten." Foods that list any of these key words on the label usually contain gluten: ?Wheat, flour, enriched flour, bromated flour, white flour, durum flour, graham flour, phosphated flour, self-rising flour, semolina, farina, barley (malt), rye, and oats. ?Starch, dextrin, modified food starch, or cereal. ?Thickening, fillers, or emulsifiers. ?Malt flavoring, malt extract, or malt syrup. ?Hydrolyzed vegetable protein. ?In the [place] packaged foods that are gluten-free are required to be labeled "GF." These foods should be easy to identify and are safe to eat. In the [place] food companies are also required to list common food allergens, including wheat, on their labels. ?Shopping ?When grocery shopping, start by shopping in the produce, meat, and dairy sections. These sections are more likely to contain gluten-free foods. Then move to the aisles that  contain packaged foods if you need to. ?Meal planning ?All fruits, vegetables, and meats are safe to eat and do not contain gluten. ?Talk with your dietitian or health care provider before taking a gluten-free multivitamin or mineral supplement. ?Be aware of gluten-free foods having contact with foods that contain gluten (cross-contamination). This can happen at home and with any processed foods. ?Talk with your health care provider or dietitian about how to reduce the risk of cross-contamination in your home. ?If you have questions about how a food is processed, ask the manufacturer. ?What foods can I eat? ?Fruits ?All plain fresh, frozen, canned, and dried fruits, and 100% fruit juices. ?Vegetables ?All plain fresh, frozen, and canned vegetables. ?Grains ?Amaranth, bean flours, 100% buckwheat flour, corn, millet, nut flours or nut meals, GF oats, quinoa, rice, sorghum, teff, rice  wafers, pure cornmeal tortillas, popcorn, and hot cereals made from cornmeal.  ?Hominy, rice, wild rice. Some Asian rice noodles or bean noodles.  ?Arrowroot starch, corn bran, corn flour, corn germ, cornmeal, corn starch, potato flour, potato starch flour, and rice bran. Plain, brown, and sweet rice flours. Rice polish, soy flour, and tapioca starch. ?Meats and other protein foods ?All fresh beef, pork, poultry, fish, seafood, and eggs. Fish canned in water, oil, brine, or vegetable broth. Plain nuts and seeds, peanut butter.  ?Some precooked or cured meat, such as sausages or meat loaves. Some frankfurters. Dried beans, dried peas, and lentils. ?Dairy ?Fresh plain, dry, evaporated, or condensed milk. Cream, butter, sour cream, whipping cream, and most yogurts. Unprocessed cheese, most processed cheeses, some cottage cheeses, some cream cheeses. ?Beverages ?Coffee, tea, most herbal teas. Carbonated beverages and some root beers. Wine, sake, and distilled spirits, such as gin, vodka, and whiskey. Most hard ciders. ?Fats and oils ?Butter,  margarine, vegetable oil, hydrogenated butter, olive oil, shortening, lard, cream, and some mayonnaise. Some commercial salad dressings. Olives. ?Sweets and desserts ?Sugar, honey, some syrups, molasses, jelly,

## 2021-08-22 NOTE — Telephone Encounter (Signed)
Patient is coming into the clinic tomorrow to be seen for his allergic reaction he had over the weekend. ?

## 2021-08-22 NOTE — Telephone Encounter (Signed)
Spoke to the patient and he stated that he mentioned the referral to see a allergy specialist. I put it in yesterday and it just need to be follow up on. Patient is also coming into the clinic to be seen tomorrow due to having an allergic reaction from eating pasta over he weekend. ?

## 2021-09-10 ENCOUNTER — Ambulatory Visit
Admission: EM | Admit: 2021-09-10 | Discharge: 2021-09-10 | Disposition: A | Discharge status: Home / Self Care | Payer: 59 | Attending: Internal Medicine | Admitting: Internal Medicine

## 2021-09-10 ENCOUNTER — Ambulatory Visit (INDEPENDENT_AMBULATORY_CARE_PROVIDER_SITE_OTHER): Payer: 59

## 2021-09-10 DIAGNOSIS — M545 Low back pain, unspecified: Secondary | ICD-10-CM

## 2021-09-10 MED ORDER — CYCLOBENZAPRINE HCL 5 MG PO TABS: 5.0000 mg | ORAL_TABLET | Freq: Two times a day (BID) | ORAL | 0 refills | Status: DC | PRN

## 2021-09-10 NOTE — ED Triage Notes (Signed)
Patient presents to Urgent Care with complaints of BACK PAIN  since Saturday. Patient reports stiffness, difficulty sitting up and standing from lying position in low back near sacrum 10/10 pain. Pt reports aunt gave him some muscle relaxers that helped some and ice and heat has not tried otc pain medications  ? 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  ? ?

## 2021-09-10 NOTE — ED Provider Notes (Signed)
?[facility] ? ? ? ?CSN: [id number] ?Arrival date & time: 09/10/21  1748 ? ? ?  ? ?History   ?Chief Complaint ?Chief Complaint  ?Patient presents with  ? Back Pain  ? ? ?HPI ?Derrick Clayton is a 41 y.o. male.  ? ?Patient presents with lower back pain across the entirety of lower back that started approximately 3 days ago.  Denies any apparent injury.  Patient does report that he was rolling on a medicine ball approximately 7 to 8 years ago and heard a pop in his back but is not sure if this is related.  Back pain flared up a few months prior that resolved with ibuprofen.  Denies any numbness or tingling, urinary frequency, urinary or bowel continence, saddle anesthesia.  Patient has taken muscle relaxers that his family member had and applied ice and heat with minimal improvement in pain.  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  No upper back or neck pain.Pain does not radiate down legs. Sitting exacerbates pain. ? ? ?Back Pain ? ?Past Medical History:  ?Diagnosis Date  ? Acute thoracic aortic dissection ([place]) 03/10/2018  ? COVID 04/2020  ? Hypertension   ? S/P aortic dissection repair 03/11/2018  ? Straight graft replacement of ascending thoracic aorta with resuspension of native aortic valve and open hemi-arch distal anastomosis  ? ? ?Patient Active Problem List  ? Diagnosis Date Noted  ? Cutaneous abscess of face 08/22/2021  ? Thoracic aortic aneurysm without rupture ([place]) 09/25/2020  ? History of aortic dissection 07/02/2018  ? History of paroxysmal atrial tachycardia 07/02/2018  ? S/P repair ascending aortic dissection 03/11/2018  ? Uncontrolled hypertension 03/10/2018  ? TOBACCO DEPENDENCE 07/24/2006  ? Essential hypertension 07/24/2006  ? ? ?Past Surgical History:  ?Procedure Laterality Date  ? AORTIC INTERVENTION N/A 03/10/2018  ? Procedure: RESUSPENSION OF THE NATIVE AORTIC VALVE;  Surgeon: Rexene Alberts, MD;  Location: [facility];  Service: Open Heart Surgery;  Laterality: N/A;  ? AORTIC VALVE REPAIR N/A 03/10/2018  ? Procedure: REPAIR OF ASCENDING AORTIC DISECTION USING STRAIGHT HEMASHIELD PLATINUM 26MM VASCULAR GRAFT; OPEN HEMIARCH DISTAL ANASTOMOSIS;  Surgeon: Rexene Alberts, MD;  Location: [facility];  Service: Open Heart Surgery;  Laterality: N/A;  ? FOOT SURGERY    ? ? ? ? ? ?Home Medications   ? ?Prior to Admission medications   ?Medication Sig Start Date End Date Taking? Authorizing Provider  ?cyclobenzaprine (FLEXERIL) 5 MG tablet Take 1 tablet (5 mg total) by mouth 2 (two) times daily as needed for muscle spasms. 09/10/21  Yes Teodora Medici, FNP  ?amLODipine (NORVASC) 10 MG tablet Take 1 tablet by mouth once daily 06/26/21   Vevelyn Francois, NP  ?aspirin EC 81 MG tablet Take 81 mg by mouth daily.    [provider]  ?lisinopril (ZESTRIL) 20 MG tablet Take 1.5 tablets (30 mg total) by mouth daily. 10/05/20   Sande Rives E, PA-C  ?metoprolol (TOPROL-XL) 200 MG 24 hr tablet TAKE 1 TABLET BY MOUTH ONCE DAILY WITH MEALS OR  IMMEDIATELY  FOLLOWING  A  MEAL 09/14/20   Buford Dresser, MD  ?Omega-3 Fatty Acids (FISH OIL) 1000 MG CAPS Take by mouth. ?Patient not taking: Reported on 09/10/2021    [provider]  ? ? ?Family History ?Family History  ?Problem Relation Age of Onset  ? Cancer Mother   ?  Heart failure Father   ? ? ?Social History ?Social History  ? ?Tobacco Use  ? Smoking status: Former  ?  Packs/day: 0.00  ?  Types: Cigarettes  ? Smokeless tobacco: Never  ?Vaping Use  ? Vaping Use: Never used  ?Substance Use Topics  ? Alcohol use: Yes  ?  Comment: 3-4 days a week  ? Drug use: Yes  ?  Frequency: 3.0 times per week  ?  Types: Marijuana  ? ? ? ?Allergies   ?Patient has no known allergies. ? ? ?Review of Systems ?Review of Systems ?Per HPI ? ?Physical Exam ?Triage Vital Signs ?ED Triage Vitals  ?Enc Vitals Group  ?   BP 09/10/21 1954 (!) 146/78  ?   Pulse Rate 09/10/21 1954 80  ?    Resp 09/10/21 1954 20  ?   Temp 09/10/21 1954 98 ?F (36.7 ?C)  ?   Temp Source 09/10/21 1954 Oral  ?   SpO2 09/10/21 1954 98 %  ?   Weight 09/10/21 2000 245 lb (111.1 kg)  ?   Height 09/10/21 2000 6\' 1"  (1.854 m)  ?   Head Circumference --   ?   Peak Flow --   ?   Pain Score 09/10/21 2000 10  ?   Pain Loc --   ?   Pain Edu? --   ?   Excl. in [place]? --   ? ?No data found. ? ?Updated Vital Signs ?BP (!) 146/78 (BP Location: Left Arm)   Pulse 80   Temp 98 ?F (36.7 ?C) (Oral)   Resp 20   Ht 6\' 1"  (1.854 m)   Wt 245 lb (111.1 kg)   SpO2 98%   BMI 32.32 kg/m?  ? ?Visual Acuity ?Right Eye Distance:   ?Left Eye Distance:   ?Bilateral Distance:   ? ?Right Eye Near:   ?Left Eye Near:    ?Bilateral Near:    ? ?Physical Exam ?Constitutional:   ?   General: He is not in acute distress. ?   Appearance: Normal appearance. He is not toxic-appearing or diaphoretic.  ?HENT:  ?   Head: Normocephalic and atraumatic.  ?Eyes:  ?   Extraocular Movements: Extraocular movements intact.  ?   Conjunctiva/sclera: Conjunctivae normal.  ?Pulmonary:  ?   Effort: Pulmonary effort is normal.  ?Musculoskeletal:  ?   Cervical back: Normal.  ?   Thoracic back: Normal.  ?   Lumbar back: Tenderness present. No swelling or edema.  ?   Comments: Tenderness to palpation throughout entirety of lower lumbar region.  No crepitus or step-off noted.  ?Neurological:  ?   General: No focal deficit present.  ?   Mental Status: He is alert and oriented to person, place, and time. Mental status is at baseline.  ?   Deep Tendon Reflexes: Reflexes are normal and symmetric.  ?Psychiatric:     ?   Mood and Affect: Mood normal.     ?   Behavior: Behavior normal.     ?   Thought Content: Thought content normal.     ?   Judgment: Judgment normal.  ? ? ? ?UC Treatments / Results  ?Labs ?(all labs ordered are listed, but only abnormal results are displayed) ?Labs Reviewed - No data to display ? ?EKG ? ? ?Radiology ?DG Lumbar Spine Complete ? ?Result Date:  09/10/2021 ?CLINICAL DATA:  Back pain. EXAM: LUMBAR SPINE - COMPLETE 4+ VIEW COMPARISON:  None. FINDINGS: There is no evidence of  lumbar spine fracture. Alignment is normal. Intervertebral disc spaces are maintained. There are atherosclerotic calcifications of the aorta. IMPRESSION: No evidence for fracture or malalignment. Electronically Signed   By: Ronney Asters M.D.   On: 09/10/2021 20:29   ? ?Procedures ?Procedures (including critical care time) ? ?Medications Ordered in UC ?Medications - No data to display ? ?Initial Impression / Assessment and Plan / UC Course  ?I have reviewed the triage vital signs and the nursing notes. ? ?Pertinent labs & imaging results that were available during my care of the patient were reviewed by me and considered in my medical decision making (see chart for details). ? ?  ? ?X-ray was negative for any acute bony abnormality.  Suspect muscular strain/injury.  Limited options on pain management given patient's history of aortic dissection and hypertension.  Will treat with muscle relaxer.  Advised patient this can cause drowsiness.  Discussed supportive care, ice application, heat application.  Patient to follow-up with provided contact information  orthopedist if pain persists or worsens.  Discussed return precautions.  Patient verbalized understanding and was agreeable with plan. ?Final Clinical Impressions(s) / UC Diagnoses  ? ?Final diagnoses:  ?Acute bilateral low back pain without sciatica  ? ? ? ?Discharge Instructions   ? ?  ?Your x-ray was negative.  Suspect muscular strain/injury.  You have been prescribed muscle relaxer to take as needed.  Please be advised that muscle relaxer can cause drowsiness and do not drive while taking this medication.  Follow-up with provided contact information for orthopedist if pain persists or worsens. ? ? ? ? ?ED Prescriptions   ? ? Medication Sig Dispense Auth. Provider  ? cyclobenzaprine (FLEXERIL) 5 MG tablet Take 1 tablet (5 mg total) by  mouth 2 (two) times daily as needed for muscle spasms. 20 tablet Teodora Medici, [place]  ? ?  ? ?I have reviewed the PDMP during this encounter. ?  ?Teodora Medici, [place]  ?09/10/21 2038 ? ?

## 2021-09-10 NOTE — Discharge Instructions (Signed)
Your x-ray was negative.  Suspect muscular strain/injury.  You have been prescribed muscle relaxer to take as needed.  Please be advised that muscle relaxer can cause drowsiness and do not drive while taking this medication.  Follow-up with provided contact information for orthopedist if pain persists or worsens. ?

## 2021-09-12 ENCOUNTER — Ambulatory Visit (INDEPENDENT_AMBULATORY_CARE_PROVIDER_SITE_OTHER): Payer: 59 | Admitting: Nurse Practitioner

## 2021-09-12 ENCOUNTER — Encounter: Payer: Self-pay | Admitting: Nurse Practitioner

## 2021-09-12 ENCOUNTER — Emergency Department (HOSPITAL_COMMUNITY)
Admission: EM | Admit: 2021-09-12 | Discharge: 2021-09-12 | Disposition: A | Discharge status: Home / Self Care | Payer: 59 | Attending: Emergency Medicine | Admitting: Emergency Medicine

## 2021-09-12 ENCOUNTER — Encounter (HOSPITAL_COMMUNITY): Payer: Self-pay | Admitting: Emergency Medicine

## 2021-09-12 VITALS — BP 150/84 | HR 94 | Temp 97.7°F | Ht 73.0 in | Wt 246.2 lb

## 2021-09-12 DIAGNOSIS — M545 Low back pain, unspecified: Secondary | ICD-10-CM

## 2021-09-12 DIAGNOSIS — Z7982 Long term (current) use of aspirin: Secondary | ICD-10-CM | POA: Diagnosis not present

## 2021-09-12 DIAGNOSIS — M549 Dorsalgia, unspecified: Secondary | ICD-10-CM | POA: Diagnosis present

## 2021-09-12 MED ORDER — GABAPENTIN 300 MG PO CAPS: 300.0000 mg | ORAL_CAPSULE | Freq: Two times a day (BID) | ORAL | 0 refills | Status: DC

## 2021-09-12 MED ORDER — PREDNISONE 10 MG (21) PO TBPK: ORAL_TABLET | Freq: Every day | ORAL | 0 refills | Status: DC

## 2021-09-12 MED ORDER — METHOCARBAMOL 500 MG PO TABS: 500.0000 mg | ORAL_TABLET | Freq: Three times a day (TID) | ORAL | 0 refills | Status: DC | PRN

## 2021-09-12 MED ORDER — KETOROLAC TROMETHAMINE 30 MG/ML IJ SOLN
30.0000 mg | Freq: Once | INTRAMUSCULAR | Status: AC
  Administered 2021-09-12: 30 mg via INTRAMUSCULAR
  Filled 2021-09-12: qty 1

## 2021-09-12 NOTE — Progress Notes (Signed)
? ?[facility] ?[place], [place]  [address] ?Phone:  [phone]   Fax:  [phone] ?Subjective:  ? Patient ID: Derrick Clayton, male    DOB: [birth year], 41 y.o.   MRN: [id number] ? ?Chief Complaint  ?Patient presents with  ? Hospitalization Follow-up  ?  Patient is here today for his hospital follow up visit. Patient was seen in the urgent on 09/10/21 and xrays were done but nothing was abnormal. Patient states that he was seen again today in the ED and they just gave him a Toradol injection and prednisone medication. Patient states that he doesn't know what has cause his lower back pain but it radiates down through the back of his buttocks, thighs and legs. Patient is walking with a cane at times and sometimes his legs give out due to the pain.  ? ?HPI ?Derrick Clayton 41 y.o. 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 To the [facility] for low back pain. ? ?States that he has had intermittent back intermittently for 6 mths. In the past was being managed well with ibuprofen, which has not been effective during this occurrence. Current flare started 4-5 days ago. Rates pain 10/10 and describes as sharp pain, radiates down bilateral thighs. States that when he walks it feels like his legs will give out on him. Pain worsens with prolonged sitting or lying flat for long periods. Denies any recent fall or injury. Took one dose of muscle relaxant with ibuprofen with the no improvement in symptoms. Went to the UC, xray was completed with benign results. Suspects he has a pinched nerve and requesting a completion of a MRI.  ? ?Currently works as a Ship broker of a [employer], states that he suspects symptoms related to prolonged standing, lifting and bending. States that he received a toradol injection this morning with no improvement in symptoms. ? ?Denies any fatigue, chest pain,  shortness of breath, HA or dizziness. Denies any blurred vision, numbness or tingling. ? ? ?Past Medical History:  ?Diagnosis Date  ? Acute thoracic aortic dissection ([place]) 03/10/2018  ? COVID 04/2020  ? Hypertension   ? S/P aortic dissection repair 03/11/2018  ? Straight graft replacement of ascending thoracic aorta with resuspension of native aortic valve and open hemi-arch distal anastomosis  ? ? ?Past Surgical History:  ?Procedure Laterality Date  ? AORTIC INTERVENTION N/A 03/10/2018  ? Procedure: RESUSPENSION OF THE NATIVE AORTIC VALVE;  Surgeon: Rexene Alberts, MD;  Location: [facility];  Service: Open Heart Surgery;  Laterality: N/A;  ? AORTIC VALVE REPAIR N/A 03/10/2018  ? Procedure: REPAIR OF ASCENDING AORTIC DISECTION USING STRAIGHT HEMASHIELD PLATINUM 26MM VASCULAR GRAFT; OPEN HEMIARCH DISTAL ANASTOMOSIS;  Surgeon: Rexene Alberts, MD;  Location: [facility];  Service: Open Heart Surgery;  Laterality: N/A;  ? FOOT SURGERY    ? ? ?Family History  ?Problem Relation Age of Onset  ? Cancer Mother   ? Heart failure Father   ? ? ?Social History  ? ?Socioeconomic History  ? Marital status: Single  ?  Spouse name: Not on file  ? Number of children: Not on file  ? Years of education: Not on file  ? Highest education level: Not on file  ?Occupational History  ? Not on file  ?Tobacco Use  ? Smoking status: Former  ?  Packs/day: 0.00  ?  Types: Cigarettes  ? Smokeless tobacco: Never  ?  Vaping Use  ? Vaping Use: Never used  ?Substance and Sexual Activity  ? Alcohol use: Yes  ?  Comment: 3-4 days a week  ? Drug use: Yes  ?  Frequency: 3.0 times per week  ?  Types: Marijuana  ? Sexual activity: Yes  ?  Birth control/protection: Condom  ?Other Topics Concern  ? Not on file  ?Social History Narrative  ? Not on file  ? ?Social Determinants of Health  ? ?Financial Resource Strain: Not on file  ?Food Insecurity: Not on file  ?Transportation Needs: Not on file  ?Physical Activity: Not on file  ?Stress: Not on file  ?Social  Connections: Not on file  ?Intimate Partner Violence: Not on file  ? ? ?Outpatient Medications Prior to Visit  ?Medication Sig Dispense Refill  ? amLODipine (NORVASC) 10 MG tablet Take 1 tablet by mouth once daily 90 tablet 0  ? aspirin EC 81 MG tablet Take 81 mg by mouth daily.    ? cyclobenzaprine (FLEXERIL) 5 MG tablet Take 1 tablet (5 mg total) by mouth 2 (two) times daily as needed for muscle spasms. 20 tablet 0  ? lisinopril (ZESTRIL) 20 MG tablet Take 1.5 tablets (30 mg total) by mouth daily. 90 tablet 1  ? methocarbamol (ROBAXIN) 500 MG tablet Take 1 tablet (500 mg total) by mouth every 8 (eight) hours as needed for muscle spasms. 20 tablet 0  ? metoprolol (TOPROL-XL) 200 MG 24 hr tablet TAKE 1 TABLET BY MOUTH ONCE DAILY WITH MEALS OR  IMMEDIATELY  FOLLOWING  A  MEAL 30 tablet 11  ? predniSONE (STERAPRED UNI-PAK 21 TAB) 10 MG (21) TBPK tablet Take by mouth daily. Take 6 tabs by mouth daily  for 1 day, then 5 tabs for 1 day, then 4 tabs for 1 day, then 3 tabs for 1 day, 2 tabs for 1 day, then 1 tab by mouth daily for 1 day 21 tablet 0  ? Omega-3 Fatty Acids (FISH OIL) 1000 MG CAPS Take by mouth. (Patient not taking: Reported on 09/10/2021)    ? ?No facility-administered medications prior to visit.  ? ? ?No Known Allergies ? ?Review of Systems  ?Constitutional: Negative.  Negative for chills, fever and malaise/fatigue.  ?Respiratory:  Negative for cough and shortness of breath.   ?Cardiovascular:  Negative for chest pain, palpitations and leg swelling.  ?Gastrointestinal:  Negative for abdominal pain, blood in stool, constipation, diarrhea, nausea and vomiting.  ?Musculoskeletal:  Positive for back pain. Negative for falls, joint pain, myalgias and neck pain.  ?Skin: Negative.   ?Neurological:  Positive for tingling and sensory change. Negative for dizziness, tremors, speech change, focal weakness, seizures, loss of consciousness, weakness and headaches.  ?Psychiatric/Behavioral:  Negative for depression. The  patient is not nervous/anxious.   ?All other systems reviewed and are negative. ? ?   ?Objective:  ?  ?Physical Exam ?Vitals reviewed.  ?Constitutional:   ?   General: He is not in acute distress. ?   Appearance: Normal appearance. He is obese.  ?HENT:  ?   Head: Normocephalic.  ?Neck:  ?   Vascular: No carotid bruit.  ?Cardiovascular:  ?   Rate and Rhythm: Normal rate and regular rhythm.  ?   Pulses: Normal pulses.  ?   Heart sounds: Normal heart sounds.  ?   Comments: No obvious peripheral edema ?Pulmonary:  ?   Effort: Pulmonary effort is normal.  ?   Breath sounds: Normal breath sounds.  ?Musculoskeletal:     ?  General: Tenderness present. No swelling, deformity or signs of injury. Normal range of motion.  ?   Cervical back: Normal range of motion and neck supple. No rigidity or tenderness.  ?   Right lower leg: No edema.  ?   Left lower leg: No edema.  ?   Comments: Moderate tenderness with palpation of diffuse lower back  ?  ?Lymphadenopathy:  ?   Cervical: No cervical adenopathy.  ?Skin: ?   General: Skin is warm and dry.  ?   Capillary Refill: Capillary refill takes less than 2 seconds.  ?Neurological:  ?   General: No focal deficit present.  ?   Mental Status: He is alert and oriented to person, place, and time.  ?Psychiatric:     ?   Mood and Affect: Mood normal.     ?   Behavior: Behavior normal.     ?   Thought Content: Thought content normal.     ?   Judgment: Judgment normal.  ? ? ?BP (!) 150/84   Pulse 94   Temp 97.7 ?F (36.5 ?C)   Ht 6\' 1"  (1.854 m)   Wt 246 lb 3.2 oz (111.7 kg)   SpO2 98%   BMI 32.48 kg/m?  ?Wt Readings from Last 3 Encounters:  ?09/12/21 246 lb 3.2 oz (111.7 kg)  ?09/10/21 245 lb (111.1 kg)  ?08/22/21 245 lb 12.8 oz (111.5 kg)  ? ? ?Immunization History  ?Administered Date(s) Administered  ? Td 10/25/2000  ? Tdap 04/17/2018  ? ? ?Diabetic Foot Exam - Simple   ?No data filed ?  ? ? ?No results found for: TSH ?Lab Results  ?Component Value Date  ? WBC 6.5 06/29/2021  ? HGB  15.4 06/29/2021  ? HCT 45.7 06/29/2021  ? MCV 88 06/29/2021  ? PLT 310 06/29/2021  ? ?Lab Results  ?Component Value Date  ? NA 139 06/29/2021  ? K 4.7 06/29/2021  ? CO2 22 03/03/2019  ? GLUCOSE 104 (H) 06/29/2021  ? BUN 14

## 2021-09-12 NOTE — ED Triage Notes (Signed)
Pt reports lower back pain since Saturday. Pt reports pain shooting into both thighs. Denies urinary s/s  ?

## 2021-09-12 NOTE — Patient Instructions (Signed)
You were seen today in the [facility] for low back pain. You were prescribed medications, please take as directed. Please follow up in 3 mths for reevaluation.  ?

## 2021-09-12 NOTE — ED Provider Notes (Signed)
?[facility]-EMERGENCY DEPT ?Provider Note ? ? ?CSN: [id number] ?Arrival date & time: 09/12/21  1038 ? ?  ? ?History ? ?Chief Complaint  ?Patient presents with  ? Back Pain  ? ? ?Derrick Clayton is a 41 y.o. male.  Presents emergency department with concern for back pain.  Patient states that he is dealt with back pain for a while but got significantly worse starting on Saturday.  Now radiating into both of his legs.  No numbness, weakness, bladder or bowel incontinence, saddle anesthesia, urinary retention.  He denies history of IVDU, no fevers or chills, no prior back surgeries.  Pain is moderate to severe, worse with movement and improved with rest.  Patient reports that he went to urgent care and was told that the x-rays of his spine were normal. ? ?Additional history obtained from review of chart, April 17 urgent care visit for low back pain.  Plain films negative. ? ?Reviewed last cardiology note, patient has history of acute aortic dissection status postrepair in October 2019.   ? ?HPI ? ?  ? ?Home Medications ?Prior to Admission medications   ?Medication Sig Start Date End Date Taking? Authorizing Provider  ?methocarbamol (ROBAXIN) 500 MG tablet Take 1 tablet (500 mg total) by mouth every 8 (eight) hours as needed for muscle spasms. 09/12/21  Yes Nyeli Holtmeyer, Quitman Livingsichard S, MD  ?predniSONE (STERAPRED UNI-PAK 21 TAB) 10 MG (21) TBPK tablet Take by mouth daily. Take 6 tabs by mouth daily  for 1 day, then 5 tabs for 1 day, then 4 tabs for 1 day, then 3 tabs for 1 day, 2 tabs for 1 day, then 1 tab by mouth daily for 1 day 09/12/21  Yes Milagros Lollykstra, Francely Craw S, MD  ?amLODipine (NORVASC) 10 MG tablet Take 1 tablet by mouth once daily 06/26/21   Barbette MerinoKing, Crystal M, NP  ?aspirin EC 81 MG tablet Take 81 mg by mouth daily.    [provider]  ?cyclobenzaprine (FLEXERIL) 5 MG tablet Take 1 tablet (5 mg total) by mouth 2 (two) times daily as needed for muscle spasms. 09/10/21   Gustavus BryantMound, Haley E, FNP  ?gabapentin  (NEURONTIN) 300 MG capsule Take 1 capsule (300 mg total) by mouth 2 (two) times daily for 14 days. 09/12/21 09/26/21  Orion CrookPassmore, Tewana I, NP  ?lisinopril (ZESTRIL) 20 MG tablet Take 1.5 tablets (30 mg total) by mouth daily. 10/05/20   Marjie SkiffGoodrich, Callie E, PA-C  ?metoprolol (TOPROL-XL) 200 MG 24 hr tablet TAKE 1 TABLET BY MOUTH ONCE DAILY WITH MEALS OR  IMMEDIATELY  FOLLOWING  A  MEAL 09/14/20   Jodelle Redhristopher, Bridgette, MD  ?Omega-3 Fatty Acids (FISH OIL) 1000 MG CAPS Take by mouth. ?Patient not taking: Reported on 09/10/2021    [provider]  ?   ? ?Allergies    ?Patient has no known allergies.   ? ?Review of Systems   ?Review of Systems  ?Constitutional:  Negative for chills and fever.  ?HENT:  Negative for ear pain and sore throat.   ?Eyes:  Negative for pain and visual disturbance.  ?Respiratory:  Negative for cough and shortness of breath.   ?Cardiovascular:  Negative for chest pain and palpitations.  ?Gastrointestinal:  Negative for abdominal pain and vomiting.  ?Genitourinary:  Negative for dysuria and hematuria.  ?Musculoskeletal:  Positive for back pain. Negative for arthralgias.  ?Skin:  Negative for color change and rash.  ?Neurological:  Negative for seizures and syncope.  ?All other systems reviewed and are negative. ? ?Physical Exam ?  Updated Vital Signs ?BP (!) 162/93   Pulse 98   Temp 98 ?F (36.7 ?C) (Oral)   Resp 16   SpO2 97%  ?Physical Exam ?Vitals and nursing note reviewed.  ?Constitutional:   ?   General: He is not in acute distress. ?   Appearance: He is well-developed.  ?HENT:  ?   Head: Normocephalic and atraumatic.  ?Eyes:  ?   Conjunctiva/sclera: Conjunctivae normal.  ?Cardiovascular:  ?   Rate and Rhythm: Normal rate and regular rhythm.  ?   Heart sounds: No murmur heard. ?Pulmonary:  ?   Effort: Pulmonary effort is normal. No respiratory distress.  ?Musculoskeletal:     ?   General: No swelling.  ?   Cervical back: Neck supple.  ?   Comments: Some tenderness to palpation along lumbar  spine, no step-off or deformity  ?Skin: ?   General: Skin is warm and dry.  ?   Capillary Refill: Capillary refill takes less than 2 seconds.  ?Neurological:  ?   Mental Status: He is alert.  ?   Comments: 5 out of 5 strength in bilateral lower extremities, sensation to light touch intact in bilateral lower extremities  ?Psychiatric:     ?   Mood and Affect: Mood normal.  ? ? ?ED Results / Procedures / Treatments   ?Labs ?(all labs ordered are listed, but only abnormal results are displayed) ?Labs Reviewed - No data to display ? ?EKG ?None ? ?Radiology ?No results found. ? ?Procedures ?Procedures  ? ? ?Medications Ordered in ED ?Medications  ?ketorolac (TORADOL) 30 MG/ML injection 30 mg (30 mg Intramuscular Given 09/12/21 1200)  ? ? ?ED Course/ Medical Decision Making/ A&P ?  ?                        ?Medical Decision Making ?Risk ?Prescription drug management. ? ? ?41 year old gentleman with a notable history of acute thoracic aortic dissection in 2019, hypertension presenting to the emergency room with concern for low back pain.  On physical exam patient appears well in no distress, noted mild hypertension.  Plain films obtained by urgent care couple days ago were negative.  Considered acute aortic pathology given patient's medical history however based on patient's symptomatology, physical exam and reproducible nature of the pain and very positional nature of the pain I have an exceedingly low suspicion for acute aortic pathology.  No upper back or mid back pain, no chest pain. While patient is at rest in the chair without any significant movement his pain completely dissipates and is only aggravated with certain positions, prolonged standing, bending, etc.  At this time I do not believe he requires emergent CTA imaging.  Patient is currently neuro intact, normal strength and sensation in his lower extremities, do not feel he requires emergent MRI at this time. Suspect more likely MSK strain, sciatica.  Advised  course of steroids, muscle relaxers, follow-up with PCP.  ? ? ? ?After the discussed management above, the patient was determined to be safe for discharge.  The patient was in agreement with this plan and all questions regarding their care were answered.  ED return precautions were discussed and the patient will return to the ED with any significant worsening of condition. ? ? ? ? ? ? ? ? ?Final Clinical Impression(s) / ED Diagnoses ?Final diagnoses:  ?Low back pain, unspecified back pain laterality, unspecified chronicity, unspecified whether sciatica present  ? ? ?Rx / DC Orders ?ED  Discharge Orders   ? ?      Ordered  ?  predniSONE (STERAPRED UNI-PAK 21 TAB) 10 MG (21) TBPK tablet  Daily       ? 09/12/21 1206  ?  methocarbamol (ROBAXIN) 500 MG tablet  Every 8 hours PRN       ? 09/12/21 1206  ? ?  ?  ? ?  ? ? ?  ?Milagros Loll, MD ?09/13/21 [phone] ? ?

## 2021-09-12 NOTE — Discharge Instructions (Signed)
Please take the steroids, muscle relaxers for your pain.  The muscle relaxer chart to be taken as needed.  These can make you somewhat drowsy something with driving or operating heavy machinery.  I also recommend following up with a spine specialist and your primary care doctor. ?

## 2021-09-14 ENCOUNTER — Ambulatory Visit (INDEPENDENT_AMBULATORY_CARE_PROVIDER_SITE_OTHER): Payer: 59 | Admitting: Physician Assistant

## 2021-09-14 DIAGNOSIS — M545 Low back pain, unspecified: Secondary | ICD-10-CM | POA: Insufficient documentation

## 2021-09-14 DIAGNOSIS — M5440 Lumbago with sciatica, unspecified side: Secondary | ICD-10-CM

## 2021-09-14 NOTE — Progress Notes (Signed)
? ?Office Visit Note ?  ?Patient: Derrick Clayton           ?Date of Birth: Mar 21, 1981           ?MRN: [id number] ?Visit Date: 09/14/2021 ?             ?Requested by: Orion Crook I, NP ?[address] ?[place],  [place] [address] ?PCP: Kathrynn Speed, NP ? ?Chief Complaint  ?Patient presents with  ? Lower Back - Pain  ? ? ? ? ?HPI: ?Patient is a pleasant 41 year old gentleman with a history of increasing lower back pain.  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  He says that he had an acute episode of back pain 6 months ago but it seemed to resolve.  The pain returned in the last week and is becoming debilitating.  He has difficulty arising getting out of bed.  He feels his legs are weak and the pain bilaterally will shoot down the back of his legs because his legs to give out.  Denies any loss of bowel or bladder control.  He has been seen in the ER and at his primary care physician and has been prescribed a Medrol Dosepak of which she is in the middle of.  He thinks has helped a little bit. ? ?Assessment & Plan: ?Visit Diagnoses: Acute low back pain ? ?Plan: Patient does not want to take any narcotics I think this is good.  His strength on exam is actually okay.  He just is very difficult doing transitions.  I am concerned about a herniated disc.  I would like him to finish the Medrol Dosepak.  If this improves enough he could do well with a course of physical therapy if it does not then I would request an MRI I would like to follow-up with him in 1 week.  If he has any increasing symptoms or increasing weakness he knows he is to go back to the emergency room x-rays done by the emergency room were read as no acute issues with his lower back he has to limit his anti-inflammatories because he does have a history of an thoracic dissection and was told not to use these ? ?Follow-Up Instructions: No follow-ups on file.  ? ?Ortho Exam ? ?Patient is alert,  oriented, no adenopathy, well-dressed, normal affect, normal respiratory effort. ?He has no step-off he has some improvement with stretching forward slight increase in pain with leaning back.  He has strength 5 out of 5 with dorsiflexion plantarflexion of his ankles extension flexion of his knees and hips.  Pain pattern is in both posterior buttocks down to the back of his knees ? ?Imaging: ?No results found. ?No images are attached to the encounter. ? ?Labs: ?Lab Results  ?Component Value Date  ? HGBA1C 5.3 03/23/2018  ? REPTSTATUS 12/22/2010 FINAL 12/18/2010  ? GRAMSTAIN  12/18/2010  ?  ABUNDANT WBC PRESENT, PREDOMINANTLY PMN ?RARE SQUAMOUS EPITHELIAL CELLS PRESENT ?NO ORGANISMS SEEN  ? CULT NO GROWTH 3 DAYS 12/18/2010  ? ? ? ?Lab Results  ?Component Value Date  ? ALBUMIN 4.3 06/29/2021  ? ALBUMIN 4.8 03/29/2021  ? ALBUMIN 4.3 09/25/2020  ? ? ?Lab Results  ?Component Value Date  ? MG 2.4 03/17/2018  ? MG 2.6 (H) 03/12/2018  ? MG 2.5 (H) 03/11/2018  ? ?No results found for: VD25OH ? ?No results found for: PREALBUMIN ? ?  Latest Ref Rng &  Units 06/29/2021  ? 10:49 AM 10/05/2019  ?  3:41 PM 03/23/2018  ?  4:00 PM  ?CBC EXTENDED  ?WBC 3.4 - 10.8 x10E3/uL 6.5   5.5   14.2    ?RBC 4.14 - 5.80 x10E6/uL 5.17   5.26   2.89    ?Hemoglobin 13.0 - 17.7 g/dL 16.3   84.6   8.4    ?HCT 37.5 - 51.0 % 45.7   45.7   27.7    ?Platelets 150 - 450 x10E3/uL 310   316   517    ?NEUT# 1.4 - 7.0 x10E3/uL 3.4   2.6     ?Lymph# 0.7 - 3.1 x10E3/uL 2.2   2.3     ? ? ? ?There is no height or weight on file to calculate BMI. ? ?Orders:  ?No orders of the defined types were placed in this encounter. ? ?No orders of the defined types were placed in this encounter. ? ? ? Procedures: ?No procedures performed ? ?Clinical Data: ?No additional findings. ? ?ROS: ? ?All other systems negative, except as noted in the HPI. ?Review of Systems ? ?Objective: ?Vital Signs: There were no vitals taken for this visit. ? ?Specialty Comments:  ?No specialty comments  available. ? ?PMFS History: ?Patient Active Problem List  ? Diagnosis Date Noted  ? Cutaneous abscess of face 08/22/2021  ? Thoracic aortic aneurysm without rupture (HCC) 09/25/2020  ? History of aortic dissection 07/02/2018  ? History of paroxysmal atrial tachycardia 07/02/2018  ? S/P repair ascending aortic dissection 03/11/2018  ? Uncontrolled hypertension 03/10/2018  ? TOBACCO DEPENDENCE 07/24/2006  ? Essential hypertension 07/24/2006  ? ?Past Medical History:  ?Diagnosis Date  ? Acute thoracic aortic dissection ([facility]) 03/10/2018  ? COVID 04/2020  ? Hypertension   ? S/P aortic dissection repair 03/11/2018  ? Straight graft replacement of ascending thoracic aorta with resuspension of native aortic valve and open hemi-arch distal anastomosis  ?  ?Family History  ?Problem Relation Age of Onset  ? Cancer Mother   ? Heart failure Father   ?  ?Past Surgical History:  ?Procedure Laterality Date  ? AORTIC INTERVENTION N/A 03/10/2018  ? Procedure: RESUSPENSION OF THE NATIVE AORTIC VALVE;  Surgeon: Purcell Nails, MD;  Location: [facility] OR;  Service: Open Heart Surgery;  Laterality: N/A;  ? AORTIC VALVE REPAIR N/A 03/10/2018  ? Procedure: REPAIR OF ASCENDING AORTIC DISECTION USING STRAIGHT HEMASHIELD PLATINUM VASCULAR GRAFT; OPEN HEMIARCH DISTAL ANASTOMOSIS;  Surgeon: Purcell Nails, MD;  Location: [facility] OR;  Service: Open Heart Surgery;  Laterality: N/A;  ? FOOT SURGERY    ? ?Social History  ? ?Occupational History  ? Not on file  ?Tobacco Use  ? Smoking status: Former  ?  Packs/day: 0.00  ?  Types: Cigarettes  ? Smokeless tobacco: Never  ?Vaping Use  ? Vaping Use: Never used  ?Substance and Sexual Activity  ? Alcohol use: Yes  ?  Comment: 3-4 days a week  ? Drug use: Yes  ?  Frequency: 3.0 times per week  ?  Types: Marijuana  ? Sexual activity: Yes  ?  Birth control/protection: Condom  ? ? ? ? ? ?

## 2021-09-18 ENCOUNTER — Other Ambulatory Visit: Payer: Self-pay

## 2021-09-18 ENCOUNTER — Encounter: Payer: Self-pay | Admitting: Emergency Medicine

## 2021-09-18 ENCOUNTER — Ambulatory Visit
Admission: EM | Admit: 2021-09-18 | Discharge: 2021-09-18 | Disposition: A | Discharge status: Home / Self Care | Payer: 59 | Attending: Urgent Care | Admitting: Urgent Care

## 2021-09-18 DIAGNOSIS — S0511XA Contusion of eyeball and orbital tissues, right eye, initial encounter: Secondary | ICD-10-CM | POA: Diagnosis not present

## 2021-09-18 DIAGNOSIS — H1131 Conjunctival hemorrhage, right eye: Secondary | ICD-10-CM

## 2021-09-18 MED ORDER — NAPROXEN 375 MG PO TABS: 375.0000 mg | ORAL_TABLET | Freq: Two times a day (BID) | ORAL | 0 refills | Status: DC
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
## 2021-09-21 ENCOUNTER — Ambulatory Visit (INDEPENDENT_AMBULATORY_CARE_PROVIDER_SITE_OTHER): Payer: 59 | Admitting: Physician Assistant

## 2021-09-21 ENCOUNTER — Ambulatory Visit: Payer: 59 | Admitting: Physician Assistant

## 2021-09-21 ENCOUNTER — Other Ambulatory Visit (HOSPITAL_COMMUNITY): Payer: Self-pay

## 2021-09-21 DIAGNOSIS — M5440 Lumbago with sciatica, unspecified side: Secondary | ICD-10-CM

## 2021-09-21 MED ORDER — METHYLPREDNISOLONE 4 MG PO TBPK: ORAL_TABLET | ORAL | 0 refills | Status: DC

## 2021-09-21 MED ORDER — MELOXICAM 15 MG PO TABS
15.0000 mg | ORAL_TABLET | Freq: Every day | ORAL | 1 refills | Status: DC
  Filled 2021-09-21: qty 30, 30d supply, fill #0

## 2021-09-21 NOTE — Progress Notes (Signed)
? ?Office Visit Note ?  ?Patient: Derrick Clayton           ?Date of Birth: 1980/12/08           ?MRN: [id number] ?Visit Date: 09/21/2021 ?             ?Requested by: Orion Crook I, NP ?[address] ?[place],  [place] [address] ?PCP: Kathrynn Speed, NP ? ?Chief Complaint  ?Patient presents with  ? Lower Back - Pain  ? ? ? ? ?HPI: ?Patient is in follow-up today for his lower back pain.  He does have an MRI scheduled for next week.  He did finish the Dosepak and he did get some relief that made it easier for him to move.  He denies any significant paresthesias but he still has buttock pain right greater than left.  He is wondering if he could try 1 more Dosepak.  He is not diabetic. ? ?Assessment & Plan: ?Visit Diagnoses:  ?1. Acute bilateral low back pain with sciatica, sciatica laterality unspecified   ? ? ?Plan: Bilateral lower back pain had some improvement with Medrol Dosepak.  Will allow him to do 1 more Medrol Dosepak.  He understands to discontinue if he has any side effects.  We will follow-up with him after the MRI is completed.  He may be a candidate for an epidural steroid injection ? ?Follow-Up Instructions: No follow-ups on file.  ? ?Ortho Exam ? ?Patient is alert, oriented, no adenopathy, well-dressed, normal affect, normal respiratory effort. ?Examination he still is slow to rise and sit but improved from previous exam he does have a positive straight leg raise on the left.  Strength is 5 out of 5 no sensation deficits ? ?Imaging: ?No results found. ?No images are attached to the encounter. ? ?Labs: ?Lab Results  ?Component Value Date  ? HGBA1C 5.3 03/23/2018  ? REPTSTATUS 12/22/2010 FINAL 12/18/2010  ? GRAMSTAIN  12/18/2010  ?  ABUNDANT WBC PRESENT, PREDOMINANTLY PMN ?RARE SQUAMOUS EPITHELIAL CELLS PRESENT ?NO ORGANISMS SEEN  ? CULT NO GROWTH 3 DAYS 12/18/2010  ? ? ? ?Lab Results  ?Component Value Date  ? ALBUMIN 4.3 06/29/2021  ? ALBUMIN 4.8 03/29/2021  ? ALBUMIN 4.3 09/25/2020  ? ? ?Lab Results   ?Component Value Date  ? MG 2.4 03/17/2018  ? MG 2.6 (H) 03/12/2018  ? MG 2.5 (H) 03/11/2018  ? ?No results found for: VD25OH ? ?No results found for: PREALBUMIN ? ?  Latest Ref Rng & Units 06/29/2021  ? 10:49 AM 10/05/2019  ?  3:41 PM 03/23/2018  ?  4:00 PM  ?CBC EXTENDED  ?WBC 3.4 - 10.8 x10E3/uL 6.5   5.5   14.2    ?RBC 4.14 - 5.80 x10E6/uL 5.17   5.26   2.89    ?Hemoglobin 13.0 - 17.7 g/dL 71.0   62.6   8.4    ?HCT 37.5 - 51.0 % 45.7   45.7   27.7    ?Platelets 150 - 450 x10E3/uL 310   316   517    ?NEUT# 1.4 - 7.0 x10E3/uL 3.4   2.6     ?Lymph# 0.7 - 3.1 x10E3/uL 2.2   2.3     ? ? ? ?There is no height or weight on file to calculate BMI. ? ?Orders:  ?Orders Placed This Encounter  ?Procedures  ? Ambulatory referral to Physical Therapy  ? ?Meds ordered this encounter  ?Medications  ? meloxicam (MOBIC) 15 MG tablet  ?  Sig: Take 1 tablet (15 mg total) by mouth daily.  ?  Dispense:  30 tablet  ?  Refill:  1  ? methylPREDNISolone (MEDROL DOSEPAK) 4 MG TBPK tablet  ?  Sig: Take as directed on package  ?  Dispense:  21 tablet  ?  Refill:  0  ? ? ? Procedures: ?No procedures performed ? ?Clinical Data: ?No additional findings. ? ?ROS: ? ?All other systems negative, except as noted in the HPI. ?Review of Systems ? ?Objective: ?Vital Signs: There were no vitals taken for this visit. ? ?Specialty Comments:  ?No specialty comments available. ? ?PMFS History: ?Patient Active Problem List  ? Diagnosis Date Noted  ? Low back pain 09/14/2021  ? Cutaneous abscess of face 08/22/2021  ? Thoracic aortic aneurysm without rupture (HCC) 09/25/2020  ? History of aortic dissection 07/02/2018  ? History of paroxysmal atrial tachycardia 07/02/2018  ? S/P repair ascending aortic dissection 03/11/2018  ? Uncontrolled hypertension 03/10/2018  ? TOBACCO DEPENDENCE 07/24/2006  ? Essential hypertension 07/24/2006  ? ?Past Medical History:  ?Diagnosis Date  ? Acute thoracic aortic dissection (HCC) 03/10/2018  ? COVID 04/2020  ? Hypertension   ?  S/P aortic dissection repair 03/11/2018  ? Straight graft replacement of ascending thoracic aorta with resuspension of native aortic valve and open hemi-arch distal anastomosis  ?  ?Family History  ?Problem Relation Age of Onset  ? Cancer Mother   ? Heart failure Father   ?  ?Past Surgical History:  ?Procedure Laterality Date  ? AORTIC INTERVENTION N/A 03/10/2018  ? Procedure: RESUSPENSION OF THE NATIVE AORTIC VALVE;  Surgeon: Purcell Nails, MD;  Location: [facility] OR;  Service: Open Heart Surgery;  Laterality: N/A;  ? AORTIC VALVE REPAIR N/A 03/10/2018  ? Procedure: REPAIR OF ASCENDING AORTIC DISECTION USING STRAIGHT HEMASHIELD PLATINUM VASCULAR GRAFT; OPEN HEMIARCH DISTAL ANASTOMOSIS;  Surgeon: Purcell Nails, MD;  Location: [facility] OR;  Service: Open Heart Surgery;  Laterality: N/A;  ? FOOT SURGERY    ? ?Social History  ? ?Occupational History  ? Not on file  ?Tobacco Use  ? Smoking status: Former  ?  Packs/day: 0.00  ?  Types: Cigarettes  ? Smokeless tobacco: Never  ?Vaping Use  ? Vaping Use: Never used  ?Substance and Sexual Activity  ? Alcohol use: Yes  ?  Comment: 3-4 days a week  ? Drug use: Yes  ?  Frequency: 3.0 times per week  ?  Types: Marijuana  ? Sexual activity: Yes  ?  Birth control/protection: Condom  ? ? ? ? ? ?

## 2021-09-21 NOTE — Progress Notes (Deleted)
09/25/21- 41 yoM former smoker for sleep evaluation courtesy of Angus Seller, NP with concern of snoring Medical problem list includes HTN,  Aortic Aneurysm, Covid infection,  Epworth score- Body weight today- Covid vax- Flu vax-

## 2021-09-25 ENCOUNTER — Institutional Professional Consult (permissible substitution): Payer: 59 | Admitting: Internal Medicine

## 2021-09-26 ENCOUNTER — Ambulatory Visit (HOSPITAL_COMMUNITY)
Admission: RE | Admit: 2021-09-26 | Discharge: 2021-09-26 | Disposition: A | Discharge status: Home / Self Care | Payer: 59 | Source: Ambulatory Visit | Attending: Physician Assistant | Admitting: Physician Assistant

## 2021-09-26 DIAGNOSIS — M5440 Lumbago with sciatica, unspecified side: Secondary | ICD-10-CM | POA: Insufficient documentation

## 2021-09-27 ENCOUNTER — Ambulatory Visit: Payer: 59 | Admitting: Nurse Practitioner

## 2021-09-28 ENCOUNTER — Ambulatory Visit: Payer: 59 | Admitting: Nurse Practitioner

## 2021-09-28 ENCOUNTER — Ambulatory Visit (INDEPENDENT_AMBULATORY_CARE_PROVIDER_SITE_OTHER): Payer: 59 | Admitting: Physician Assistant

## 2021-09-28 DIAGNOSIS — M5440 Lumbago with sciatica, unspecified side: Secondary | ICD-10-CM

## 2021-09-28 NOTE — Progress Notes (Signed)
? ?Office Visit Note ?  ?Patient: Derrick Clayton           ?Date of Birth: 04-30-1981           ?MRN: [id number] ?Visit Date: 09/28/2021 ?             ?Requested by: Orion Crook I, NP ?[address] ?[place],  [place] [address] ?PCP: Kathrynn Speed, NP ? ?Chief Complaint  ?Patient presents with  ? Lower Back - Pain, Follow-up  ?  S/p MRI  ? ? ? ? ?HPI: ?Patient is a pleasant 41 year old gentleman I have been following for severe low back pain.  He has had 2 steroid tapers.  He is tolerating these well.  He did have an MRI and is here to go over the results of the MRI.  He states he is really asymptomatic right now. ? ?Assessment & Plan: ?Visit Diagnoses:  ? ?Plan: Low back pain.  I did review the MRI with Mr. Derrick Clayton today.  He has a inferiorly migrated central disc extrusion at L5-S1 which may contact the traversing S2 nerve roots there is no mass effect or impingement on the S1 nerve roots.  Mild bilateral neural foraminal stenosis otherwise the MRI was essentially normal.  Since he is asymptomatic right now I recommended just good back mechanics and keeping his core strong.  If he has a recurrence of this and it was severe we could try a Dosepak though I do not want to do this too often.  He also could be referred for an epidural steroid injection ? ?Follow-Up Instructions: No follow-ups on file.  ? ?Ortho Exam ? ?Patient is alert, oriented, no adenopathy, well-dressed, normal affect, normal respiratory effort. ?Examination of his lower back: He has strength 5 out of 5 no reproduction of symptoms today good feeling in both of his legs no tenderness to palpation ? ?Imaging: ?No results found. ?No images are attached to the encounter. ? ?Labs: ?Lab Results  ?Component Value Date  ? HGBA1C 5.3 03/23/2018  ? REPTSTATUS 12/22/2010 FINAL 12/18/2010  ? GRAMSTAIN  12/18/2010  ?  ABUNDANT WBC PRESENT, PREDOMINANTLY PMN ?RARE SQUAMOUS EPITHELIAL CELLS PRESENT ?NO ORGANISMS SEEN  ? CULT NO GROWTH 3 DAYS 12/18/2010   ? ? ? ?Lab Results  ?Component Value Date  ? ALBUMIN 4.3 06/29/2021  ? ALBUMIN 4.8 03/29/2021  ? ALBUMIN 4.3 09/25/2020  ? ? ?Lab Results  ?Component Value Date  ? MG 2.4 03/17/2018  ? MG 2.6 (H) 03/12/2018  ? MG 2.5 (H) 03/11/2018  ? ?No results found for: VD25OH ? ?No results found for: PREALBUMIN ? ?  Latest Ref Rng & Units 06/29/2021  ? 10:49 AM 10/05/2019  ?  3:41 PM 03/23/2018  ?  4:00 PM  ?CBC EXTENDED  ?WBC 3.4 - 10.8 x10E3/uL 6.5   5.5   14.2    ?RBC 4.14 - 5.80 x10E6/uL 5.17   5.26   2.89    ?Hemoglobin 13.0 - 17.7 g/dL 81.4   48.1   8.4    ?HCT 37.5 - 51.0 % 45.7   45.7   27.7    ?Platelets 150 - 450 x10E3/uL 310   316   517    ?NEUT# 1.4 - 7.0 x10E3/uL 3.4   2.6     ?Lymph# 0.7 - 3.1 x10E3/uL 2.2   2.3     ? ? ? ?There is no height or weight on file to calculate BMI. ? ?Orders:  ?No orders of the defined  types were placed in this encounter. ? ?No orders of the defined types were placed in this encounter. ? ? ? Procedures: ?No procedures performed ? ?Clinical Data: ?No additional findings. ? ?ROS: ? ?All other systems negative, except as noted in the HPI. ?Review of Systems ? ?Objective: ?Vital Signs: There were no vitals taken for this visit. ? ?Specialty Comments:  ?No specialty comments available. ? ?PMFS History: ?Patient Active Problem List  ? Diagnosis Date Noted  ? Low back pain 09/14/2021  ? Cutaneous abscess of face 08/22/2021  ? Thoracic aortic aneurysm without rupture (HCC) 09/25/2020  ? History of aortic dissection 07/02/2018  ? History of paroxysmal atrial tachycardia 07/02/2018  ? S/P repair ascending aortic dissection 03/11/2018  ? Uncontrolled hypertension 03/10/2018  ? TOBACCO DEPENDENCE 07/24/2006  ? Essential hypertension 07/24/2006  ? ?Past Medical History:  ?Diagnosis Date  ? Acute thoracic aortic dissection (HCC) 03/10/2018  ? COVID 04/2020  ? Hypertension   ? S/P aortic dissection repair 03/11/2018  ? Straight graft replacement of ascending thoracic aorta with resuspension of native  aortic valve and open hemi-arch distal anastomosis  ?  ?Family History  ?Problem Relation Age of Onset  ? Cancer Mother   ? Heart failure Father   ?  ?Past Surgical History:  ?Procedure Laterality Date  ? AORTIC INTERVENTION N/A 03/10/2018  ? Procedure: RESUSPENSION OF THE NATIVE AORTIC VALVE;  Surgeon: Purcell Nails, MD;  Location: [facility] OR;  Service: Open Heart Surgery;  Laterality: N/A;  ? AORTIC VALVE REPAIR N/A 03/10/2018  ? Procedure: REPAIR OF ASCENDING AORTIC DISECTION USING STRAIGHT HEMASHIELD PLATINUM VASCULAR GRAFT; OPEN HEMIARCH DISTAL ANASTOMOSIS;  Surgeon: Purcell Nails, MD;  Location: [facility] OR;  Service: Open Heart Surgery;  Laterality: N/A;  ? FOOT SURGERY    ? ?Social History  ? ?Occupational History  ? Not on file  ?Tobacco Use  ? Smoking status: Former  ?  Packs/day: 0.00  ?  Types: Cigarettes  ? Smokeless tobacco: Never  ?Vaping Use  ? Vaping Use: Never used  ?Substance and Sexual Activity  ? Alcohol use: Yes  ?  Comment: 3-4 days a week  ? Drug use: Yes  ?  Frequency: 3.0 times per week  ?  Types: Marijuana  ? Sexual activity: Yes  ?  Birth control/protection: Condom  ? ? ? ? ? ?

## 2021-09-30 NOTE — Therapy (Deleted)
OUTPATIENT PHYSICAL THERAPY THORACOLUMBAR EVALUATION   Patient Name: Derrick Clayton MRN: [id number] DOB:Jan 26, 1981, 41 y.o., male Today's Date: 09/30/2021    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 and open hemi-arch distal anastomosis   Past Surgical History:  Procedure Laterality Date   AORTIC INTERVENTION N/A 03/10/2018   Procedure: RESUSPENSION OF THE NATIVE AORTIC VALVE;  Surgeon: Purcell Nails, MD;  Location: [facility] OR;  Service: Open Heart Surgery;  Laterality: N/A;   AORTIC VALVE REPAIR N/A 03/10/2018   Procedure: REPAIR OF ASCENDING AORTIC DISECTION USING STRAIGHT HEMASHIELD PLATINUM VASCULAR GRAFT; OPEN HEMIARCH DISTAL ANASTOMOSIS;  Surgeon: Purcell Nails, MD;  Location: [facility] OR;  Service: Open Heart Surgery;  Laterality: N/A;   FOOT SURGERY     Patient Active Problem List   Diagnosis Date Noted   Low back pain 09/14/2021   Cutaneous abscess of face 08/22/2021   Thoracic aortic aneurysm without rupture (HCC) 09/25/2020   History of aortic dissection 07/02/2018   History of paroxysmal atrial tachycardia 07/02/2018   S/P repair ascending aortic dissection 03/11/2018   Uncontrolled hypertension 03/10/2018   TOBACCO DEPENDENCE 07/24/2006   Essential hypertension 07/24/2006    PCP: ***  REFERRING PROVIDER: ***  REFERRING DIAG: ***  THERAPY DIAG:  No diagnosis found.  ONSET DATE: ***  SUBJECTIVE:                                                                                                                                                                                           SUBJECTIVE STATEMENT: *** PERTINENT HISTORY:  ***  PAIN:  Are you having pain? Yes: {yespain:27235::"NPRS scale: ***/10","Pain location: ***","Pain description:  ***","Aggravating factors: ***","Relieving factors: ***"}   PRECAUTIONS: {Therapy precautions:24002}  WEIGHT BEARING RESTRICTIONS {Yes ***/No:24003}  FALLS:  Has patient fallen in last 6 months? {fallsyesno:27318}  LIVING ENVIRONMENT: Lives with: {[facility] lives with:25569::"lives with their family"} Lives in: {Lives in:25570} Stairs: {opstairs:27293} Has following equipment at home: {Assistive devices:23999}  OCCUPATION: ***  PLOF: {PLOF:24004}  PATIENT GOALS ***   OBJECTIVE:   DIAGNOSTIC FINDINGS:  ***  PATIENT SURVEYS:  {rehab surveys:24030}  SCREENING FOR RED FLAGS: Bowel or bladder incontinence: {Yes/No:304960894} Spinal tumors: {Yes/No:304960894} Cauda equina syndrome: {Yes/No:304960894} Compression fracture: {Yes/No:304960894} Abdominal aneurysm: {Yes/No:304960894}  COGNITION:  Overall cognitive status: {cognition:24006}     SENSATION: {sensation:27233}  MUSCLE LENGTH: Hamstrings: Right *** deg; Left *** deg Thomas test: Right *** deg; Left *** deg  POSTURE:  ***  PALPATION: ***  LUMBAR ROM:   {AROM/PROM:27142}  A/PROM  09/30/2021  Flexion   Extension   Right lateral flexion   Left lateral flexion   Right rotation   Left rotation    (Blank rows = not tested)  LE ROM:  {AROM/PROM:27142}  Right 09/30/2021 Left 09/30/2021  Hip flexion    Hip extension    Hip abduction    Hip adduction    Hip internal rotation    Hip external rotation    Knee flexion    Knee extension    Ankle dorsiflexion    Ankle plantarflexion    Ankle inversion    Ankle eversion     (Blank rows = not tested)  LE MMT:  MMT Right 09/30/2021 Left 09/30/2021  Hip flexion    Hip extension    Hip abduction    Hip adduction    Hip internal rotation    Hip external rotation    Knee flexion    Knee extension    Ankle dorsiflexion    Ankle plantarflexion    Ankle inversion    Ankle eversion     (Blank rows = not tested)  LUMBAR SPECIAL TESTS:  {lumbar special  test:25242}  FUNCTIONAL TESTS:  {Functional tests:24029}  GAIT: Distance walked: *** Assistive device utilized: {Assistive devices:23999} Level of assistance: {Levels of assistance:24026} Comments: ***    TODAY'S TREATMENT  ***   PATIENT EDUCATION:  Education details: *** Person educated: {Person educated:25204} Education method: {Education Method:25205} Education comprehension: {Education Comprehension:25206}   HOME EXERCISE PROGRAM: ***  ASSESSMENT:  CLINICAL IMPRESSION: Patient is a *** y.o. *** who was seen today for physical therapy evaluation and treatment for ***.    OBJECTIVE IMPAIRMENTS {opptimpairments:25111}.   ACTIVITY LIMITATIONS {activity limitations:25113}.   PERSONAL FACTORS {Personal factors:25162} are also affecting patient's functional outcome.    REHAB POTENTIAL: {rehabpotential:25112}  CLINICAL DECISION MAKING: {clinical decision making:25114}  EVALUATION COMPLEXITY: {Evaluation complexity:25115}   GOALS: Goals reviewed with patient? {yes/no:20286}  SHORT TERM GOALS: Target date: {follow up:25551}  *** Baseline: Goal status: {GOALSTATUS:25110}  2.  *** Baseline:  Goal status: {GOALSTATUS:25110}  3.  *** Baseline:  Goal status: {GOALSTATUS:25110}  4.  *** Baseline:  Goal status: {GOALSTATUS:25110}  5.  *** Baseline:  Goal status: {GOALSTATUS:25110}  6.  *** Baseline:  Goal status: {GOALSTATUS:25110}  LONG TERM GOALS: Target date: {follow up:25551}  *** Baseline:  Goal status: {GOALSTATUS:25110}  2.  *** Baseline:  Goal status: {GOALSTATUS:25110}  3.  *** Baseline:  Goal status: {GOALSTATUS:25110}  4.  *** Baseline:  Goal status: {GOALSTATUS:25110}  5.  *** Baseline:  Goal status: {GOALSTATUS:25110}  6.  *** Baseline:  Goal status: {GOALSTATUS:25110}   PLAN: PT FREQUENCY: {rehab frequency:25116}  PT DURATION: {rehab duration:25117}  PLANNED INTERVENTIONS: {rehab planned  interventions:25118::"Therapeutic exercises","Therapeutic activity","Neuromuscular re-education","Balance training","Gait training","Patient/Family education","Joint mobilization"}.  PLAN FOR NEXT SESSION: ***   Francis Yardley, PT 09/30/2021, 8:14 PM

## 2021-10-01 ENCOUNTER — Ambulatory Visit (INDEPENDENT_AMBULATORY_CARE_PROVIDER_SITE_OTHER): Payer: 59 | Admitting: Nurse Practitioner

## 2021-10-01 ENCOUNTER — Ambulatory Visit: Payer: 59 | Attending: Physician Assistant | Admitting: Physical Therapy

## 2021-10-01 ENCOUNTER — Encounter: Payer: Self-pay | Admitting: Nurse Practitioner

## 2021-10-01 VITALS — BP 144/79 | HR 70 | Temp 98.6°F | Ht 73.0 in | Wt 245.2 lb

## 2021-10-01 DIAGNOSIS — I10 Essential (primary) hypertension: Secondary | ICD-10-CM | POA: Diagnosis not present

## 2021-10-01 DIAGNOSIS — K9041 Non-celiac gluten sensitivity: Secondary | ICD-10-CM | POA: Diagnosis not present

## 2021-10-01 DIAGNOSIS — M5440 Lumbago with sciatica, unspecified side: Secondary | ICD-10-CM

## 2021-10-01 MED ORDER — HYDROCHLOROTHIAZIDE 25 MG PO TABS: 25.0000 mg | ORAL_TABLET | Freq: Every day | ORAL | 0 refills | Status: DC

## 2021-10-01 NOTE — Progress Notes (Signed)
? ?[facility] ?[address] ?[place], [place]  [address] ?Phone:  [phone]   Fax:  [phone] ?Subjective:  ? Patient ID: Derrick Clayton, male    DOB: February 21, 1981, 40 y.o.   MRN: [id number] ? ?Chief Complaint  ?Patient presents with  ? Follow-up  ?  Pt is here for 3 month follow up pt is requesting referral to the allergist and refill on methylprednisolone  ? ?HPI ?Derrick Clayton 41 y.o. male  has a past medical history of Acute thoracic aortic dissection (HCC) (03/10/2018), COVID (04/2020), Hypertension, and S/P aortic dissection repair (03/11/2018). To the [facility] for reevaluation of HTN and low back pain. ? ?Patient states that back pain has resolved since previous visit. Completed follow up with orthopedics.  ? 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 Patient states that previous provider had allergy testing completed in the clinic, with resulting gluten allergy. Was informed that he would be referred to allergy specialist for further management of gluten allergy, but has not received a call or appointment.  ? ? ?Hypertension: Patient here for follow-up of elevated blood pressure. He is not exercising and is not adherent to low salt diet.  Patient does not check B/P at home. Cardiac symptoms none. Patient denies chest pain, claudication, fatigue, and near-syncope.  Cardiovascular risk factors: hypertension, male gender, and obesity (BMI >= 30 kg/m2). Use of agents associated with hypertension: none. History of target organ damage: none.  ? ?Patient states that he is compliant with all medications. Denies any other concerns today. ? ?Denies any fatigue, chest pain, shortness of breath, HA or dizziness. Denies any blurred vision, numbness or tingling. ? ? ?Past Medical History:  ?Diagnosis Date  ? Acute thoracic aortic dissection (HCC) 03/10/2018  ? COVID 04/2020  ? Hypertension   ? S/P aortic dissection repair 03/11/2018  ? Straight graft replacement of ascending thoracic aorta  with resuspension of native aortic valve and open hemi-arch distal anastomosis  ? ? ?Past Surgical History:  ?Procedure Laterality Date  ? AORTIC INTERVENTION N/A 03/10/2018  ? Procedure: RESUSPENSION OF THE NATIVE AORTIC VALVE;  Surgeon: Purcell Nails, MD;  Location: [facility] OR;  Service: Open Heart Surgery;  Laterality: N/A;  ? AORTIC VALVE REPAIR N/A 03/10/2018  ? Procedure: REPAIR OF ASCENDING AORTIC DISECTION USING STRAIGHT HEMASHIELD PLATINUM VASCULAR GRAFT; OPEN HEMIARCH DISTAL ANASTOMOSIS;  Surgeon: Purcell Nails, MD;  Location: [facility] OR;  Service: Open Heart Surgery;  Laterality: N/A;  ? FOOT SURGERY    ? ? ?Family History  ?Problem Relation Age of Onset  ? Cancer Mother   ? Heart failure Father   ? ? ?Social History  ? ?Socioeconomic History  ? Marital status: Single  ?  Spouse name: Not on file  ? Number of children: Not on file  ? Years of education: Not on file  ? Highest education level: Not on file  ?Occupational History  ? Not on file  ?Tobacco Use  ? Smoking status: Former  ?  Packs/day: 0.00  ?  Types: Cigarettes  ? Smokeless tobacco: Never  ?Vaping Use  ? Vaping Use: Never used  ?Substance and Sexual Activity  ? Alcohol use: Yes  ?  Comment: 3-4 days a week  ? Drug use: Yes  ?  Frequency: 3.0 times per week  ?  Types: Marijuana  ? Sexual activity: Yes  ?  Birth control/protection: Condom  ?Other Topics Concern  ? Not on file  ?Social History Narrative  ? Not  on file  ? ?Social Determinants of Health  ? ?Financial Resource Strain: Not on file  ?Food Insecurity: Not on file  ?Transportation Needs: Not on file  ?Physical Activity: Not on file  ?Stress: Not on file  ?Social Connections: Not on file  ?Intimate Partner Violence: Not on file  ? ? ?Outpatient Medications Prior to Visit  ?Medication Sig Dispense Refill  ? amLODipine (NORVASC) 10 MG tablet Take 1 tablet by mouth once daily 90 tablet 0  ? aspirin EC 81 MG tablet Take 81 mg by mouth daily.    ? lisinopril (ZESTRIL) 20 MG tablet Take 1.5  tablets (30 mg total) by mouth daily. 90 tablet 1  ? methocarbamol (ROBAXIN) 500 MG tablet Take 1 tablet (500 mg total) by mouth every 8 (eight) hours as needed for muscle spasms. 20 tablet 0  ? methylPREDNISolone (MEDROL DOSEPAK) 4 MG TBPK tablet Take as directed on package 21 tablet 0  ? metoprolol (TOPROL-XL) 200 MG 24 hr tablet TAKE 1 TABLET BY MOUTH ONCE DAILY WITH MEALS OR  IMMEDIATELY  FOLLOWING  A  MEAL 30 tablet 11  ? cyclobenzaprine (FLEXERIL) 5 MG tablet Take 1 tablet (5 mg total) by mouth 2 (two) times daily as needed for muscle spasms. (Patient not taking: Reported on 10/01/2021) 20 tablet 0  ? gabapentin (NEURONTIN) 300 MG capsule Take 1 capsule (300 mg total) by mouth 2 (two) times daily for 14 days. 28 capsule 0  ? meloxicam (MOBIC) 15 MG tablet Take 1 tablet (15 mg total) by mouth daily. (Patient not taking: Reported on 10/01/2021) 30 tablet 1  ? Omega-3 Fatty Acids (FISH OIL) 1000 MG CAPS Take by mouth. (Patient not taking: Reported on 09/10/2021)    ? predniSONE (STERAPRED UNI-PAK 21 TAB) 10 MG (21) TBPK tablet Take by mouth daily. Take 6 tabs by mouth daily  for 1 day, then 5 tabs for 1 day, then 4 tabs for 1 day, then 3 tabs for 1 day, 2 tabs for 1 day, then 1 tab by mouth daily for 1 day (Patient not taking: Reported on 10/01/2021) 21 tablet 0  ? ?No facility-administered medications prior to visit.  ? ? ?No Known Allergies ? ?Review of Systems  ?Constitutional:  Negative for chills, fever and malaise/fatigue.  ?Respiratory:  Negative for cough and shortness of breath.   ?Cardiovascular:  Negative for chest pain, palpitations and leg swelling.  ?Gastrointestinal:  Negative for abdominal pain, blood in stool, constipation, diarrhea, nausea and vomiting.  ?Musculoskeletal:  Positive for back pain. Negative for falls, joint pain, myalgias and neck pain.  ?Skin: Negative.   ?Neurological: Negative.   ?Psychiatric/Behavioral:  Negative for depression. The patient is not nervous/anxious.   ?All other  systems reviewed and are negative. ? ?   ?Objective:  ?  ?Physical Exam ?Constitutional:   ?   General: He is not in acute distress. ?   Appearance: Normal appearance. He is obese.  ?HENT:  ?   Head: Normocephalic.  ?Neck:  ?   Vascular: No carotid bruit.  ?Cardiovascular:  ?   Rate and Rhythm: Normal rate and regular rhythm.  ?   Pulses: Normal pulses.  ?   Heart sounds: Normal heart sounds.  ?   Comments: No obvious peripheral edema ?Pulmonary:  ?   Effort: Pulmonary effort is normal.  ?   Breath sounds: Normal breath sounds.  ?Musculoskeletal:     ?   General: No swelling, tenderness, deformity or signs of injury. Normal range of  motion.  ?   Cervical back: Normal range of motion and neck supple. No rigidity or tenderness.  ?   Right lower leg: No edema.  ?   Left lower leg: No edema.  ?Lymphadenopathy:  ?   Cervical: No cervical adenopathy.  ?Skin: ?   General: Skin is warm and dry.  ?   Capillary Refill: Capillary refill takes less than 2 seconds.  ?Neurological:  ?   General: No focal deficit present.  ?   Mental Status: He is alert and oriented to person, place, and time.  ?Psychiatric:     ?   Mood and Affect: Mood normal.     ?   Behavior: Behavior normal.     ?   Thought Content: Thought content normal.     ?   Judgment: Judgment normal.  ? ? ?BP (!) 144/79 (BP Location: Right Arm, Patient Position: Sitting, Cuff Size: Large)   Pulse 70   Temp 98.6 ?F (37 ?C)   Ht 6\' 1"  (1.854 m)   Wt 245 lb 3.2 oz (111.2 kg)   BMI 32.35 kg/m?  ?Wt Readings from Last 3 Encounters:  ?10/01/21 245 lb 3.2 oz (111.2 kg)  ?09/12/21 246 lb 3.2 oz (111.7 kg)  ?09/10/21 245 lb (111.1 kg)  ? ? ?Immunization History  ?Administered Date(s) Administered  ? Td 10/25/2000  ? Tdap 04/17/2018  ? ? ?Diabetic Foot Exam - Simple   ?No data filed ?  ? ? ?No results found for: TSH ?Lab Results  ?Component Value Date  ? WBC 6.5 06/29/2021  ? HGB 15.4 06/29/2021  ? HCT 45.7 06/29/2021  ? MCV 88 06/29/2021  ? PLT 310 06/29/2021  ? ?Lab  Results  ?Component Value Date  ? NA 139 06/29/2021  ? K 4.7 06/29/2021  ? CO2 22 03/03/2019  ? GLUCOSE 104 (H) 06/29/2021  ? BUN 14 06/29/2021  ? CREATININE 1.14 06/29/2021  ? BILITOT 0.4 06/29/2021  ? ALKPHOS 79 02/03/2

## 2021-10-01 NOTE — Patient Instructions (Signed)
You were seen today in the [facility] for reevaluation of low back pain and HTN.  You were prescribed medications, please take as directed. Please follow up in 3 mths for reevaluation of HTN ?

## 2021-10-02 ENCOUNTER — Other Ambulatory Visit (HOSPITAL_COMMUNITY): Payer: Self-pay

## 2021-10-03 ENCOUNTER — Encounter: Payer: Self-pay | Admitting: Primary Care

## 2021-10-03 ENCOUNTER — Ambulatory Visit (INDEPENDENT_AMBULATORY_CARE_PROVIDER_SITE_OTHER): Payer: 59 | Admitting: Primary Care

## 2021-10-03 VITALS — BP 130/64 | HR 80 | Ht 73.0 in | Wt 246.2 lb

## 2021-10-03 DIAGNOSIS — R0683 Snoring: Secondary | ICD-10-CM | POA: Diagnosis not present

## 2021-10-03 DIAGNOSIS — G478 Other sleep disorders: Secondary | ICD-10-CM | POA: Insufficient documentation

## 2021-10-03 DIAGNOSIS — G4719 Other hypersomnia: Secondary | ICD-10-CM | POA: Diagnosis not present

## 2021-10-03 NOTE — Progress Notes (Signed)
? ?@Patient  ID: Derrick Clayton, male    DOB: [birth year], 41 y.o.   MRN: [id number] ? ?Chief Complaint  ?Patient presents with  ? sleep consult  ?  Pt states he has been told that he stops breathing while sleeping. When he wakes up in the morning, he will sometimes feels like he has not slept at all.  ? ? ?Referring provider: ?Fenton Foy, NP ? ?HPI: ?41 year old male, former smoker. PMH HTN, paroxysmal atrial tachycardia, s/p repair ascending aortic dissection.  ? ?10/03/2021 ?Patient presents today for sleep consult. He has symptoms of loud snoring and daytime sleepiness. He appears tired today. He typically gets 5-6 hours of sleep a night but does not feel rested the next day. He wakes up on average one-two times a night to use the restroom. He takes a nap most days. He has two children, ages 60 and 8, and has one on the way. He has a family hx sleep apnea. No prior sleep study. Denies narcolepsy, cataplexy or sleep walking. ? ?Sleep questionnaire ?Symptoms- Stops breathing while sleeping  ?Prior sleep study- None ?Bedtime- 11pm-3am ?Time to fall asleep- varies, depends on how tired he is  ?Nocturnal awakenings- once or twice ?Out of bed/start of day- 7am  ?Weight changes- 20 lbs in 4 years  ?Do you operate heavy machinery- No ?Do you currently wear CPAP- No ?Do you current wear oxygen- No ?Epworth- 10  ? ? ?No Known Allergies ? ?Immunization History  ?Administered Date(s) Administered  ? Td 10/25/2000  ? Tdap 04/17/2018  ? ? ?Past Medical History:  ?Diagnosis Date  ? Acute thoracic aortic dissection ([place]) 03/10/2018  ? COVID 04/2020  ? Hypertension   ? S/P aortic dissection repair 03/11/2018  ? Straight graft replacement of ascending thoracic aorta with resuspension of native aortic valve and open hemi-arch distal anastomosis  ? ? ?Tobacco History: ?Social History  ? ?Tobacco Use  ?Smoking Status Former  ? Packs/day: 0.00  ? Types: Cigarettes  ?Smokeless Tobacco Never  ? ?Counseling given: Not  Answered ? ? ?Outpatient Medications Prior to Visit  ?Medication Sig Dispense Refill  ? amLODipine (NORVASC) 10 MG tablet Take 1 tablet by mouth once daily 90 tablet 0  ? aspirin EC 81 MG tablet Take 81 mg by mouth daily.    ? cyclobenzaprine (FLEXERIL) 5 MG tablet Take 1 tablet (5 mg total) by mouth 2 (two) times daily as needed for muscle spasms. 20 tablet 0  ? hydrochlorothiazide (HYDRODIURIL) 25 MG tablet Take 1 tablet (25 mg total) by mouth daily. 90 tablet 0  ? lisinopril (ZESTRIL) 20 MG tablet Take 1.5 tablets (30 mg total) by mouth daily. 90 tablet 1  ? metoprolol (TOPROL-XL) 200 MG 24 hr tablet TAKE 1 TABLET BY MOUTH ONCE DAILY WITH MEALS OR  IMMEDIATELY  FOLLOWING  A  MEAL 30 tablet 11  ? Omega-3 Fatty Acids (FISH OIL) 1000 MG CAPS Take by mouth. (Patient not taking: Reported on 09/10/2021)    ? gabapentin (NEURONTIN) 300 MG capsule Take 1 capsule (300 mg total) by mouth 2 (two) times daily for 14 days. 28 capsule 0  ? meloxicam (MOBIC) 15 MG tablet Take 1 tablet (15 mg total) by mouth daily. (Patient not taking: Reported on 10/01/2021) 30 tablet 1  ? methocarbamol (ROBAXIN) 500 MG tablet Take 1 tablet (500 mg total) by mouth every 8 (eight) hours as needed for muscle spasms. 20 tablet 0  ? methylPREDNISolone (MEDROL DOSEPAK) 4 MG TBPK tablet Take as directed  on package 21 tablet 0  ? predniSONE (STERAPRED UNI-PAK 21 TAB) 10 MG (21) TBPK tablet Take by mouth daily. Take 6 tabs by mouth daily  for 1 day, then 5 tabs for 1 day, then 4 tabs for 1 day, then 3 tabs for 1 day, 2 tabs for 1 day, then 1 tab by mouth daily for 1 day (Patient not taking: Reported on 10/01/2021) 21 tablet 0  ? ?No facility-administered medications prior to visit.  ? ? ?Review of Systems ? ?Review of Systems  ?Constitutional:  Positive for fatigue.  ?HENT: Negative.    ?Respiratory:  Positive for apnea.   ?Psychiatric/Behavioral:  Positive for sleep disturbance.   ? ? ?Physical Exam ? ?BP 130/64 (BP Location: Right Arm, Patient Position:  Sitting, Cuff Size: Normal)   Pulse 80   Ht 6\' 1"  (1.854 m)   Wt 246 lb 3.2 oz (111.7 kg)   SpO2 98% Comment: RA  BMI 32.48 kg/m?  ?Physical Exam ?Constitutional:   ?   Appearance: Normal appearance. He is obese.  ?HENT:  ?   Head: Normocephalic and atraumatic.  ?   Mouth/Throat:  ?   Mouth: Mucous membranes are moist.  ?   Pharynx: Oropharynx is clear.  ?Cardiovascular:  ?   Rate and Rhythm: Normal rate and regular rhythm.  ?Pulmonary:  ?   Effort: Pulmonary effort is normal.  ?   Breath sounds: Normal breath sounds. No wheezing or rhonchi.  ?Musculoskeletal:     ?   General: Normal range of motion.  ?Skin: ?   General: Skin is warm and dry.  ?Neurological:  ?   General: No focal deficit present.  ?   Mental Status: He is alert and oriented to person, place, and time. Mental status is at baseline.  ?Psychiatric:     ?   Mood and Affect: Mood normal.     ?   Behavior: Behavior normal.     ?   Thought Content: Thought content normal.     ?   Judgment: Judgment normal.  ?  ? ?Lab Results: ? ?CBC ?   ?Component Value Date/Time  ? WBC 6.5 06/29/2021 1049  ? WBC 14.2 (H) 03/23/2018 1600  ? RBC 5.17 06/29/2021 1049  ? RBC 2.89 (L) 03/23/2018 1600  ? HGB 15.4 06/29/2021 1049  ? HCT 45.7 06/29/2021 1049  ? PLT 310 06/29/2021 1049  ? MCV 88 06/29/2021 1049  ? MCH 29.8 06/29/2021 1049  ? MCH 29.1 03/23/2018 1600  ? MCHC 33.7 06/29/2021 1049  ? MCHC 30.3 03/23/2018 1600  ? RDW 12.5 06/29/2021 1049  ? LYMPHSABS 2.2 06/29/2021 1049  ? MONOABS 0.7 12/23/2009 1318  ? EOSABS 0.1 06/29/2021 1049  ? BASOSABS 0.1 06/29/2021 1049  ? ? ?BMET ?   ?Component Value Date/Time  ? NA 139 06/29/2021 1049  ? K 4.7 06/29/2021 1049  ? CL 103 06/29/2021 1049  ? CO2 22 03/03/2019 1145  ? GLUCOSE 104 (H) 06/29/2021 1049  ? GLUCOSE 91 03/23/2018 1600  ? BUN 14 06/29/2021 1049  ? CREATININE 1.14 06/29/2021 1049  ? CALCIUM 9.6 06/29/2021 1049  ? GFRNONAA 80 03/03/2020 0859  ? GFRAA 92 03/03/2020 0859  ? ? ?BNP ?   ?Component Value Date/Time  ? BNP  519.7 (H) 03/23/2018 1600  ? ? ?ProBNP ?No results found for: PROBNP ? ?Imaging: ?DG Lumbar Spine Complete ? ?Result Date: 09/10/2021 ?CLINICAL DATA:  Back pain. EXAM: LUMBAR SPINE - COMPLETE 4+ VIEW COMPARISON:  None.  FINDINGS: There is no evidence of lumbar spine fracture. Alignment is normal. Intervertebral disc spaces are maintained. There are atherosclerotic calcifications of the aorta. IMPRESSION: No evidence for fracture or malalignment. Electronically Signed   By: Ronney Asters M.D.   On: 09/10/2021 20:29  ? ?MR Lumbar Spine w/o contrast ? ?Result Date: 09/27/2021 ?CLINICAL DATA:  Low back pain for 6 months, intermittent EXAM: MRI LUMBAR SPINE WITHOUT CONTRAST TECHNIQUE: Multiplanar, multisequence MR imaging of the lumbar spine was performed. No intravenous contrast was administered. COMPARISON:  Marked spine radiographs 09/10/2021, CTA abdomen/pelvis 03/15/2019 FINDINGS: Segmentation: Standard; the lowest formed disc space is designated L5-S1. Alignment:  Normal. Vertebrae: Vertebral body heights are preserved. Background marrow signal is normal. There is no suspicious signal abnormality or marrow edema. There is no evidence of acute injury. Conus medullaris and cauda equina: Conus extends to the L1 level. Conus and cauda equina appear normal. Paraspinal and other soft tissues: Unremarkable. Disc levels: T12-L1: No significant spinal canal or neural foraminal stenosis L1-L2: No significant spinal canal or neural foraminal stenosis. L2-L3: No significant spinal canal or neural foraminal stenosis L3-L4: No significant spinal canal or neural foraminal stenosis L4-L5: There is mild right facet arthropathy without significant spinal canal or neural foraminal stenosis L5-S1: There is disc desiccation and narrowing with a large inferiorly migrated central disc extrusion extending approximately 1.4 cm below the level of the disc space, and mild bilateral facet arthropathy. The disc extrusion does not contact or  impinge either traversing S1 nerve root, but may contact the traversing S2 nerve root (8-38). There is mild left worse than right neural foraminal stenosis at this level. IMPRESSION: 1. Large inferiorly migrated central disc extrus

## 2021-10-03 NOTE — Assessment & Plan Note (Addendum)
-   Patient has symptoms of loud snoring, excessive daytime sleepiness and witnessed apnea. Epworth 10. BMI 32. I have a strong suspicion patient has sleep apnea, needs in-lab split night sleep study to assess for OSA. Discussed risk of untreated sleep apnea including cardiac arrhthymia, pulmonary HTN, stroke, DM. We briefly reviewed treatment options, he is reluctant to being started on CPAP but depending on severity of OSA would consider trial therapy if needed. Encouraged patient to work on weight loss efforts and focus on side sleeping position/elevate head of bed while sleeping. Advised against driving if experiencing excessive daytime sleepiness. Follow-up in 6 weeks to review sleep study results and discuss treatment options further.  ?

## 2021-10-03 NOTE — Progress Notes (Signed)
Reviewed and agree with assessment/plan. ? ? ?Coralyn Helling, MD ?[facility] Pulmonary/[facility] ?10/03/2021, 5:32 PM ?Pager:  [phone] ? ?

## 2021-10-03 NOTE — Patient Instructions (Signed)
Sleep apnea is defined as period of 10 seconds or longer when you stop breathing at night. This can happen multiple times a night. Dx sleep apnea is when this occurs more than 5 times an hour.  ?  ?Mild OSA 5-15 apneic events an hour ?Moderate OSA 15-30 apneic events an hour ?Severe OSA > 30 apneic events an hour ?  ?Untreated sleep apnea puts you at higher risk for cardiac arrhythmias, pulmonary HTN, stroke and diabetes ?  ?Treatment options include weight loss, side sleeping position, oral appliance, CPAP therapy or referral to ENT for possible surgical options  ?  ?Recommendations: ?Focus on side sleeping position ?Work on weight loss efforts  ?Do not drive if experiencing excessive daytime sleepiness of fatigue  ?  ?Orders: ?Split night sleep study re: excessive daytime sleepiness/loud snoring  ?  ?Follow-up: ?Please schedule follow-up in 6 weeks to review sleep study and discuss treatment if needed with [facility] NP  ? ?

## 2021-10-14 ENCOUNTER — Other Ambulatory Visit: Payer: Self-pay | Admitting: Cardiology

## 2021-10-14 DIAGNOSIS — I10 Essential (primary) hypertension: Secondary | ICD-10-CM

## 2021-10-14 DIAGNOSIS — Z8679 Personal history of other diseases of the circulatory system: Secondary | ICD-10-CM

## 2021-10-14 DIAGNOSIS — Z9889 Other specified postprocedural states: Secondary | ICD-10-CM

## 2021-10-15 ENCOUNTER — Other Ambulatory Visit: Payer: Self-pay | Admitting: Cardiology

## 2021-10-15 ENCOUNTER — Other Ambulatory Visit: Payer: Self-pay | Admitting: Nurse Practitioner

## 2021-10-15 DIAGNOSIS — I10 Essential (primary) hypertension: Secondary | ICD-10-CM

## 2021-10-15 NOTE — Telephone Encounter (Signed)
Rx(s) sent to pharmacy electronically.  

## 2021-11-02 ENCOUNTER — Encounter (HOSPITAL_BASED_OUTPATIENT_CLINIC_OR_DEPARTMENT_OTHER): Payer: Self-pay | Admitting: Cardiology

## 2021-11-02 ENCOUNTER — Ambulatory Visit (INDEPENDENT_AMBULATORY_CARE_PROVIDER_SITE_OTHER): Payer: 59 | Admitting: Cardiology

## 2021-11-02 VITALS — BP 124/70 | HR 82 | Ht 73.0 in | Wt 244.0 lb

## 2021-11-02 DIAGNOSIS — I10 Essential (primary) hypertension: Secondary | ICD-10-CM | POA: Diagnosis not present

## 2021-11-02 DIAGNOSIS — Z9889 Other specified postprocedural states: Secondary | ICD-10-CM

## 2021-11-02 DIAGNOSIS — Z7189 Other specified counseling: Secondary | ICD-10-CM

## 2021-11-02 DIAGNOSIS — Z8679 Personal history of other diseases of the circulatory system: Secondary | ICD-10-CM

## 2021-11-02 MED ORDER — LISINOPRIL-HYDROCHLOROTHIAZIDE 20-25 MG PO TABS: 1.0000 | ORAL_TABLET | Freq: Every day | ORAL | 3 refills | Status: DC

## 2021-11-02 NOTE — Patient Instructions (Signed)
Medication Instructions:  Your physician has recommended you make the following change in your medication:   Stop: lisinopril  Stop: Hydrochlorothiazide   Start: Lisinopril-Hydrochlorothiazide 20-25mg  tablet daily   *If you need a refill on your cardiac medications before your next appointment, please call your pharmacy*   Lab Work: None ordered today   Testing/Procedures: None ordered today    Follow-Up: At [facility], you and your health needs are our priority.  As part of our continuing mission to provide you with exceptional heart care, we have created designated Provider Care Teams.  These Care Teams include your primary Cardiologist (physician) and Advanced Practice Providers (APPs -  Physician Assistants and Nurse Practitioners) who all work together to provide you with the care you need, when you need it.  We recommend signing up for the patient portal called "MyChart".  Sign up information is provided on this After Visit Summary.  MyChart is used to connect with patients for Virtual Visits (Telemedicine).  Patients are able to view lab/test results, encounter notes, upcoming appointments, etc.  Non-urgent messages can be sent to your provider as well.   To learn more about what you can do with MyChart, go to [email or web address].    Your next appointment:   1 year(s)  The format for your next appointment:   In Person  Provider:   Jodelle Red, MD{  Other Instructions Heart Healthy Diet Recommendations: A low-salt diet is recommended. Meats should be grilled, baked, or boiled. Avoid fried foods. Focus on lean protein sources like fish or chicken with vegetables and fruits. The American Heart Association is a Chief Technology Officer!  American Heart Association Diet and Lifeystyle Recommendations   Exercise recommendations: The American Heart Association recommends 150 minutes of moderate intensity exercise weekly. Try 30 minutes of moderate intensity exercise  4-5 times per week. This could include walking, jogging, or swimming.   Important Information About Sugar

## 2021-11-02 NOTE — Progress Notes (Signed)
Cardiology Office Note:    Date:  11/02/2021   ID:  Derrick Clayton, DOB Nov 19, 1980, MRN [id number]  PCP:  Kathrynn Speed, NP  Cardiologist:  Jodelle Red, MD  Referring MD: Barbette Merino, NP   Chief Complaint:  Follow up  History of Present Illness:    Derrick Clayton is a 41 y.o. male with a hx of hypertension, acute ascending aortic dissection s/p repair 02/2018. He is seen for follow-up.   Cardiac history: He presented to the ER 02/2018 with chest pain and hypertensive emergency. He had severe chest and foot pain, and he was found to have an ascending aortic dissection and no doppler flow to the left foot. Dissection also involved right carotid, celiac axis, SMA and bilateral iliac arteries. He was emergently taken for REPAIR OF ASCENDING AORTIC DISECTION USING STRAIGHT HEMASHIELD PLATINUM VASCULAR GRAFT; OPEN HEMIARCH DISTAL ANASTOMOSIS AND RESUSPENSION OF THE NATIVE AORTIC VALVE. His hospitalization was notable for possible atrial tachycardia vs. Flutter, but risk was considered to outweigh benefit of anticoagulation. He presented to the ER on 03/23/18 for peripheral leg edema and had lasix increased for a week.   Last visit: He endorsed mild dizziness which resolved with rest, as well as recurrent epistaxis over a period of 6 weeks. He successfully quit smoking. He reported compliance with his medications.   Today:  On 09/18/21, patient went to the ED regarding subconjunctival hemorrhage of right eye. Given naproxen to use as needed. His eye has recovered well since the hemorrhage.   His blood pressure is doing much better. It was 124/70 this appointment. He attributes this to trying not to stress too much.   He reported dizziness when working. It lasted about 2 minutes. He was working out in the heat, and began to feel very lightheaded. After resting, the issue resolved.  His arm has also been going numb. It occurs mainly when he is working. While he is standing, his  left arm radiates tingling distally. He shakes it out, and allows feeling to come back after a minute.   He is tolerating the medications well. He has been recently prescribed hydrochlorothiazide. He is frustrated with his pill burden, and we reviewed which medications we can combine.  He has not been active recently, because his work is so time-consuming.   He remains tobacco free.  He denies any palpitations, chest pain, shortness of breath, or peripheral edema. No headaches, syncope, orthopnea, or PND.     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 and open hemi-arch distal anastomosis   Past Surgical History:  Procedure Laterality Date   AORTIC INTERVENTION N/A 03/10/2018   Procedure: RESUSPENSION OF THE NATIVE AORTIC VALVE;  Surgeon: Purcell Nails, MD;  Location: [facility] OR;  Service: Open Heart Surgery;  Laterality: N/A;   AORTIC VALVE REPAIR N/A 03/10/2018   Procedure: REPAIR OF ASCENDING AORTIC DISECTION USING STRAIGHT HEMASHIELD PLATINUM VASCULAR GRAFT; OPEN HEMIARCH DISTAL ANASTOMOSIS;  Surgeon: Purcell Nails, MD;  Location: [facility] OR;  Service: Open Heart Surgery;  Laterality: N/A;   FOOT SURGERY       Current Meds  Medication Sig   amLODipine (NORVASC) 10 MG tablet Take 1 tablet by mouth once daily   aspirin EC 81 MG tablet Take 81 mg by mouth  daily.   cyclobenzaprine (FLEXERIL) 5 MG tablet Take 1 tablet (5 mg total) by mouth 2 (two) times daily as needed for muscle spasms.   lisinopril-hydrochlorothiazide (ZESTORETIC) 20-25 MG tablet Take 1 tablet by mouth daily.   metoprolol (TOPROL-XL) 200 MG 24 hr tablet TAKE 1 TABLET BY MOUTH ONCE DAILY WITH MEALS OR IMMEDIATELY FOLLOWING A MEAL   [DISCONTINUED] hydrochlorothiazide (HYDRODIURIL) 25 MG tablet Take  1 tablet (25 mg total) by mouth daily.   [DISCONTINUED] lisinopril (ZESTRIL) 20 MG tablet TAKE 1 TABLET BY MOUTH ONCE DAILY . APPOINTMENT REQUIRED FOR FUTURE REFILLS     Allergies:   Patient has no known allergies.   Social History   Tobacco Use   Smoking status: Former    Packs/day: 0.00    Types: Cigarettes   Smokeless tobacco: Never  Vaping Use   Vaping Use: Never used  Substance Use Topics   Alcohol use: Yes    Comment: 3-4 days a week   Drug use: Yes    Frequency: 3.0 times per week    Types: Marijuana     Family Hx: The patient's family history includes Cancer in his mother; Heart failure in his father.  ROS:   Please see the history of present illness.     (+)Dizziness (+)Numbness in left arm (+)Stress  All other systems reviewed and are negative.   Prior CV studies:   The following studies were reviewed today:  CTA 03/15/19 with stable dissection measurements, stable post op measurements  Echo 03/11/2018:  Septum: No Patent Foramen Ovale present.   Left atrium: Patent foramen ovale not present.   Aortic valve: The valve is trileaflet. Mild to moderate regurgitation.   Aorta: Dissection present.   Mitral valve: Trace regurgitation.    Labs/Other Tests and Data Reviewed:    EKG:  EKG is personally reviewed. 11/02/21: not ordered today 05/04/2021: Sinus rhythm. Rate 73 bpm. 03/13/2020: NSR at 61 bpm.  Recent Labs: 06/29/2021: BUN 14; Creatinine, Ser 1.14; Hemoglobin 15.4; Platelets 310; Potassium 4.7; Sodium 139   Recent Lipid Panel Lab Results  Component Value Date/Time   CHOL 168 03/29/2021 09:37 AM   TRIG 178 (H) 03/29/2021 09:37 AM   HDL 45 03/29/2021 09:37 AM   CHOLHDL 3.7 03/29/2021 09:37 AM   LDLCALC 92 03/29/2021 09:37 AM    Wt Readings from Last 3 Encounters:  11/02/21 244 lb (110.7 kg)  10/03/21 246 lb 3.2 oz (111.7 kg)  10/01/21 245 lb 3.2 oz (111.2 kg)     Objective:    Vital Signs:  BP 124/70   Pulse 82   Ht 6\' 1"  (1.854  m)   Wt 244 lb (110.7 kg)   BMI 32.19 kg/m    GEN: Well nourished, well developed in no acute distress HEENT: Normal, moist mucous membranes NECK: No JVD CARDIAC: regular rhythm, normal S1 and S2, no rubs or gallops. No murmur. VASCULAR: Radial and DP pulses 2+ bilaterally. No carotid bruits RESPIRATORY:  Clear to auscultation without rales, wheezing or rhonchi  ABDOMEN: Soft, non-tender, non-distended MUSCULOSKELETAL:  Ambulates independently SKIN: Warm and dry, no edema NEUROLOGIC:  Alert and oriented x 3. No focal neuro deficits noted. PSYCHIATRIC:  Normal affect   ASSESSMENT & PLAN:    1. Essential hypertension   2. History of aortic dissection   3. S/P repair ascending aortic dissection   4. Cardiac risk counseling     Type A aortic dissection, s/p repair Hypertension -blood pressure control much improved -continue metoprolol succinate 200 mg  daily.  -combined lisinopril 20 mg and HCTZ 25 mg into combo pill to lessen pill burden -on amlodipine 10 mg -taking aspirin 81 mg -We have discussed statins on multiple occasions, declines   Tobacco cessation: remains tobacco abstinent  CV risk counseling and prevention: -recommend heart healthy/Mediterranean diet, with whole grains, fruits, vegetable, fish, lean meats, nuts, and olive oil. Limit salt. -recommend moderate walking, 3-5 times/week for 30-50 minutes each session. Aim for at least 150 minutes.week. Goal should be pace of 3 miles/hours, or walking 1.5 miles in 30 minutes -recommend avoidance of tobacco products. Avoid excess alcohol. -ASCVD risk score: The 10-year ASCVD risk score (Arnett DK, et al., 2019) is: 5.4%   Values used to calculate the score:     Age: 47 years     Sex: Male     Is Non-Hispanic African American: Yes     Diabetic: No     Tobacco smoker: No     Systolic Blood Pressure: 124 mmHg     Is BP treated: Yes     HDL Cholesterol: 45 mg/dL     Total Cholesterol: 168 mg/dL    Follow up:    follow up in 1 yr  No orders of the defined types were placed in this encounter.   Patient Instructions  Medication Instructions:  Your physician has recommended you make the following change in your medication:   Stop: lisinopril  Stop: Hydrochlorothiazide   Start: Lisinopril-Hydrochlorothiazide 20-25mg  tablet daily   *If you need a refill on your cardiac medications before your next appointment, please call your pharmacy*   Lab Work: None ordered today   Testing/Procedures: None ordered today    Follow-Up: At [facility], you and your health needs are our priority.  As part of our continuing mission to provide you with exceptional heart care, we have created designated Provider Care Teams.  These Care Teams include your primary Cardiologist (physician) and Advanced Practice Providers (APPs -  Physician Assistants and Nurse Practitioners) who all work together to provide you with the care you need, when you need it.  We recommend signing up for the patient portal called "MyChart".  Sign up information is provided on this After Visit Summary.  MyChart is used to connect with patients for Virtual Visits (Telemedicine).  Patients are able to view lab/test results, encounter notes, upcoming appointments, etc.  Non-urgent messages can be sent to your provider as well.   To learn more about what you can do with MyChart, go to [email or web address].    Your next appointment:   1 year(s)  The format for your next appointment:   In Person  Provider:   Jodelle Red, MD{  Other Instructions Heart Healthy Diet Recommendations: A low-salt diet is recommended. Meats should be grilled, baked, or boiled. Avoid fried foods. Focus on lean protein sources like fish or chicken with vegetables and fruits. The American Heart Association is a Chief Technology Officer!  American Heart Association Diet and Lifeystyle Recommendations   Exercise recommendations: The American Heart Association  recommends 150 minutes of moderate intensity exercise weekly. Try 30 minutes of moderate intensity exercise 4-5 times per week. This could include walking, jogging, or swimming.   Important Information About Sugar        I,Mathew Stumpf,acting as a scribe for [employer], MD.,have documented all relevant documentation on the behalf of Jodelle Red, MD,as directed by  Jodelle Red, MD while in the presence of Jodelle Red, MD.  I, Jodelle Red, MD, have reviewed all documentation  for this visit. The documentation on 11/02/21 for the exam, diagnosis, procedures, and orders are all accurate and complete.   Signed, Jodelle Red, MD  11/02/2021 1:14 PM    [facility]

## 2021-11-07 NOTE — Progress Notes (Signed)
New Patient Note  RE: Derrick Clayton MRN: [id number] DOB: 08/09/1980 Date of Office Visit: 11/08/2021  Consult requested by: Kathrynn SpeedPassmore, Tewana I, NP Primary care provider: Kathrynn SpeedPassmore, Tewana I, NP  Chief Complaint: Allergic Reaction (Face breakouts when eating gluten and is sometimes itchy. Wants to know what all has gluten in it. First noticed it when he started working for [employer].)  History of Present Illness: I had the pleasure of seeing Derrick Clayton for initial evaluation at the [facility] on 11/08/2021. He is a 41 y.o. male, who is referred here by Orion CrookPassmore, Tewana I, NP for the evaluation of gluten sensitivity.  Rash started about 1.5 year ago. Mainly occurs on his face. Describes them as itchy, bumpy. Flares  lasts about 5 days. Today is an average day for the rash. This started happening once he started working at [employer].  Associated symptoms include: sometimes has some GI symptoms such as nausea and some frequent stools but not sure if they are related.  Frequency of episodes: unsure. Suspected triggers are unsure. Denies any fevers, chills, changes in medications, foods, personal care products or recent infections. He has tried the following therapies: antibiotics with good benefit.   Previous work up includes: [facility] which showed positive to gluten screen. He stopped eating bread but still eats pizza, pasta as he was not sure what foods have gluten in it.  Previous history of rash/hives: no.  Dietary History: patient has been eating other foods including milk, eggs, peanut, treenuts, sesame, shellfish, fish, soy, wheat, meats, fruits and vegetables.  Component     Latest Ref Rng 06/29/2021  tTG/DGP Screen     Negative  Positive !    Component     Latest Ref Rng 06/29/2021  Allergen Gluten IgE     Class 0 kU/L <0.10    Assessment and Plan: Derrick GrieveBryan is a 41 y.o. male with: Rash and other nonspecific skin eruption Facial rash for 1.5  years since working at [employer]. Some GI symptoms at times. PCP drew bloodwork and wheat IgE was negative, tTg/DGP screen positive. No prior GI evaluation. Patient concerned about food allergies. Discussed with patient that based on clinical history unlikely that this rash is caused by IgE mediated food allergy. Patient still would like to undergo testing.  Today's skin prick testing showed: Negative to food panel.  Reviewed bloodwork that PCP ordered - negative wheat IgE; positive celiac screen. Refer to GI due to the positive celiac screen - discussed that this is different than a food "allergy". Start strict avoidance of gluten - reviewed at length about foods that contain gluten as patient was not sure what is gluten.  Handout given regarding gluten free diet.   Positive autoantibody screening for celiac disease See assessment and plan as above.  Return if symptoms worsen or fail to improve.  No orders of the defined types were placed in this encounter.  Lab Orders  No laboratory test(s) ordered today    Other allergy screening: Asthma: no Rhino conjunctivitis: no Medication allergy: no Hymenoptera allergy: no Urticaria: no Eczema:no History of recurrent infections suggestive of immunodeficency: no  Diagnostics: Skin Testing: Food allergy panel. Negative to food panel.  Results discussed with patient/family.  Food Adult Perc - 11/08/21 0900     Time Antigen Placed 0930    Allergen Manufacturer Waynette ButteryGreer    Location Back    Number of allergen test 72     Control-buffer 50% Glycerol Negative  Control-Histamine 1 mg/ml 3+    1. Peanut Negative    2. Soybean Negative    3. Wheat Negative    4. Sesame Negative    5. Milk, cow Negative    6. Egg White, Chicken Negative    7. Casein Negative    8. Shellfish Mix Negative    9. Fish Mix Negative    10. Cashew Negative    11. Pecan Food Negative    12. Walnut Food Negative    13. Almond Negative    14. Hazelnut Negative     15. [place] nut Negative    16. Coconut Negative    17. Pistachio Negative    18. Catfish Negative    19. Bass Negative    20. Trout Negative    21. Tuna Negative    22. Salmon Negative    23. Flounder Negative    24. Codfish Negative    25. Shrimp Negative    26. Crab Negative    27. Lobster Negative    28. Oyster Negative    29. Scallops Negative    30. Barley Negative    31. Oat  Negative    32. Rye  Negative    33. Hops Negative    34. Rice Negative    35. Cottonseed Negative    36. Saccharomyces Cerevisiae  Negative    37. Pork Negative    38. [place] Meat Negative    39. Chicken Meat Negative    40. Beef Negative    41. Lamb Negative    42. Tomato Negative    43. White Potato Negative    44. Sweet Potato Negative    45. Pea, Green/English Negative    46. Navy Bean Negative    47. Mushrooms Negative    48. Avocado Negative    49. Onion Negative    50. Cabbage Negative    51. Carrots Negative    52. Celery Negative    53. Corn Negative    54. Cucumber Negative    55. Grape (White seedless) Negative    56. Orange  Negative    57. Banana Negative    58. Apple Negative    59. Peach Negative    60. Strawberry Negative    61. Cantaloupe Negative    62. Watermelon Negative    63. Pineapple Negative    64. Chocolate/Cacao bean Negative    65. Karaya Gum Negative    66. Acacia (Arabic Gum) Negative    67. Cinnamon Negative    68. Nutmeg Negative    69. Ginger Negative    70. Garlic Negative    71. Pepper, black Negative    72. Mustard Negative             Past Medical History: Patient Active Problem List   Diagnosis Date Noted   Rash and other nonspecific skin eruption 11/08/2021   Positive autoantibody screening for celiac disease 11/08/2021   Excessive daytime sleepiness 10/03/2021   Low back pain 09/14/2021   Cutaneous abscess of face 08/22/2021   Thoracic aortic aneurysm without rupture (HCC) 09/25/2020   History of aortic dissection 07/02/2018    History of paroxysmal atrial tachycardia 07/02/2018   S/P repair ascending aortic dissection 03/11/2018   Uncontrolled hypertension 03/10/2018   TOBACCO DEPENDENCE 07/24/2006   Essential hypertension 07/24/2006   Past Medical History:  Diagnosis Date   Acute thoracic aortic dissection (HCC) 03/10/2018   COVID 04/2020   Hypertension    S/P aortic  dissection repair 03/11/2018   Straight graft replacement of ascending thoracic aorta with resuspension of native aortic valve and open hemi-arch distal anastomosis   Past Surgical History: Past Surgical History:  Procedure Laterality Date   AORTIC INTERVENTION N/A 03/10/2018   Procedure: RESUSPENSION OF THE NATIVE AORTIC VALVE;  Surgeon: Purcell Nails, MD;  Location: [facility];  Service: Open Heart Surgery;  Laterality: N/A;   AORTIC VALVE REPAIR N/A 03/10/2018   Procedure: REPAIR OF ASCENDING AORTIC DISECTION USING STRAIGHT HEMASHIELD PLATINUM VASCULAR GRAFT; OPEN HEMIARCH DISTAL ANASTOMOSIS;  Surgeon: Purcell Nails, MD;  Location: [facility] OR;  Service: Open Heart Surgery;  Laterality: N/A;   FOOT SURGERY     Medication List:  Current Outpatient Medications  Medication Sig Dispense Refill   amLODipine (NORVASC) 10 MG tablet Take 1 tablet by mouth once daily 90 tablet 0   aspirin EC 81 MG tablet Take 81 mg by mouth daily.     cyclobenzaprine (FLEXERIL) 5 MG tablet Take 1 tablet (5 mg total) by mouth 2 (two) times daily as needed for muscle spasms. 20 tablet 0   lisinopril-hydrochlorothiazide (ZESTORETIC) 20-25 MG tablet Take 1 tablet by mouth daily. 90 tablet 3   metoprolol (TOPROL-XL) 200 MG 24 hr tablet TAKE 1 TABLET BY MOUTH ONCE DAILY WITH MEALS OR IMMEDIATELY FOLLOWING A MEAL 90 tablet 1   No current facility-administered medications for this visit.   Allergies: No Known Allergies Social History: Social History   Socioeconomic History   Marital status: Single    Spouse name: Not on file   Number of children: Not on file    Years of education: Not on file   Highest education level: Not on file  Occupational History   Not on file  Tobacco Use   Smoking status: Former    Packs/day: 0.50    Types: Cigarettes   Smokeless tobacco: Never  Vaping Use   Vaping Use: Never used  Substance and Sexual Activity   Alcohol use: Yes    Comment: 3-4x a week   Drug use: Yes    Frequency: 3.0 times per week    Types: Marijuana   Sexual activity: Yes    Birth control/protection: Condom  Other Topics Concern   Not on file  Social History Narrative   Not on file   Social Determinants of Health   Financial Resource Strain: Not on file  Food Insecurity: Not on file  Transportation Needs: Not on file  Physical Activity: Not on file  Stress: Not on file  Social Connections: Not on file   Lives in a house. Smoking: 1/2 pack per day since age 19.  Occupation: Production designer, theatre/television/film at [employer] History: Immunologist in the house: no Engineer, civil (consulting) in the family room: no Carpet in the bedroom: yes Heating: gas Cooling: central Pet: no  Family History: Family History  Problem Relation Age of Onset   Cancer Mother    Heart failure Father    Asthma Son    Allergic rhinitis Neg Hx    Eczema Neg Hx    Urticaria Neg Hx    Review of Systems  Constitutional:  Negative for appetite change, chills, fever and unexpected weight change.  HENT:  Negative for congestion and rhinorrhea.   Eyes:  Negative for itching.  Respiratory:  Negative for cough, chest tightness, shortness of breath and wheezing.   Cardiovascular:  Negative for chest pain.  Gastrointestinal:  Positive for nausea. Negative for abdominal pain.  Genitourinary:  Negative for  difficulty urinating.  Skin:  Positive for rash.  Allergic/Immunologic: Negative for food allergies.  Neurological:  Negative for headaches.    Objective: BP 128/78   Pulse 65   Temp 97.8 F (36.6 C)   Resp (!) 21   Ht 5' 10.75" (1.797 m)   Wt 243 lb 8 oz (110.5 kg)    SpO2 97%   BMI 34.20 kg/m  Body mass index is 34.2 kg/m. Physical Exam Vitals and nursing note reviewed.  Constitutional:      Appearance: Normal appearance. He is well-developed.  HENT:     Head: Normocephalic and atraumatic.     Right Ear: Tympanic membrane and external ear normal.     Left Ear: Tympanic membrane and external ear normal.     Nose: Nose normal.     Mouth/Throat:     Mouth: Mucous membranes are moist.     Pharynx: Oropharynx is clear.  Eyes:     Conjunctiva/sclera: Conjunctivae normal.  Cardiovascular:     Rate and Rhythm: Normal rate and regular rhythm.     Heart sounds: Normal heart sounds. No murmur heard.    No friction rub. No gallop.  Pulmonary:     Effort: Pulmonary effort is normal.     Breath sounds: Normal breath sounds. No wheezing, rhonchi or rales.  Musculoskeletal:     Cervical back: Neck supple.  Skin:    General: Skin is warm.     Findings: Rash present.     Comments: Scattered papular rash on forehead and nasal area.   Neurological:     Mental Status: He is alert and oriented to person, place, and time.  Psychiatric:        Behavior: Behavior normal.    The plan was reviewed with the patient/family, and all questions/concerned were addressed.  It was my pleasure to see Dois today and participate in his care. Please feel free to contact me with any questions or concerns.  Sincerely,  Wyline Mood, DO Allergy & Immunology  [facility] office: [phone] [facility] office: [phone]

## 2021-11-08 ENCOUNTER — Ambulatory Visit (INDEPENDENT_AMBULATORY_CARE_PROVIDER_SITE_OTHER): Payer: 59 | Admitting: Allergy

## 2021-11-08 ENCOUNTER — Ambulatory Visit (HOSPITAL_BASED_OUTPATIENT_CLINIC_OR_DEPARTMENT_OTHER): Payer: 59 | Attending: Primary Care | Admitting: Pulmonary Disease

## 2021-11-08 ENCOUNTER — Encounter: Payer: Self-pay | Admitting: Allergy

## 2021-11-08 ENCOUNTER — Telehealth: Payer: Self-pay

## 2021-11-08 VITALS — BP 128/78 | HR 65 | Temp 97.8°F | Resp 21 | Ht 70.75 in | Wt 243.5 lb

## 2021-11-08 DIAGNOSIS — Z713 Dietary counseling and surveillance: Secondary | ICD-10-CM | POA: Diagnosis not present

## 2021-11-08 DIAGNOSIS — R768 Other specified abnormal immunological findings in serum: Secondary | ICD-10-CM

## 2021-11-08 DIAGNOSIS — R21 Rash and other nonspecific skin eruption: Secondary | ICD-10-CM | POA: Diagnosis not present

## 2021-11-08 DIAGNOSIS — G4719 Other hypersomnia: Secondary | ICD-10-CM | POA: Insufficient documentation

## 2021-11-08 DIAGNOSIS — G478 Other sleep disorders: Secondary | ICD-10-CM | POA: Diagnosis not present

## 2021-11-08 DIAGNOSIS — R0683 Snoring: Secondary | ICD-10-CM | POA: Insufficient documentation

## 2021-11-08 DIAGNOSIS — T781XXA Other adverse food reactions, not elsewhere classified, initial encounter: Secondary | ICD-10-CM | POA: Diagnosis not present

## 2021-11-08 DIAGNOSIS — T781XXD Other adverse food reactions, not elsewhere classified, subsequent encounter: Secondary | ICD-10-CM

## 2021-11-08 NOTE — Assessment & Plan Note (Signed)
Facial rash for 1.5 years since working at [employer]. Some GI symptoms at times. PCP drew bloodwork and wheat IgE was negative, tTg/DGP screen positive. No prior GI evaluation. Patient concerned about food allergies.  Discussed with patient that based on clinical history unlikely that this rash is caused by IgE mediated food allergy. Patient still would like to undergo testing.   Today's skin prick testing showed: Negative to food panel.   Reviewed bloodwork that PCP ordered - negative wheat IgE; positive celiac screen.  Refer to GI due to the positive celiac screen - discussed that this is different than a food "allergy".  Start strict avoidance of gluten - reviewed at length about foods that contain gluten as patient was not sure what is gluten.   Handout given regarding gluten free diet.

## 2021-11-08 NOTE — Assessment & Plan Note (Signed)
.   See assessment and plan as above. 

## 2021-11-08 NOTE — Telephone Encounter (Signed)
Please refer patient to GI for positive celiac screening per Dr. Selena Batten.

## 2021-11-08 NOTE — Patient Instructions (Addendum)
Today's skin testing showed: Negative to food panel.   Results given.  Food Your bloodwork for wheat IgE  and skin testing to wheat was negative - this checks for wheat allergy. Your celiac screen was positive - this is different then a wheat allergy. Celiac can cause rashes. I will refer to you to GI for further evaluation.  Start strict avoidance of gluten.  See handout.  Rash Take pictures of the flares. See below for proper skin care. Recommend stopping smoking.   Follow up if needed.  Skin care recommendations  Bath time: Always use lukewarm water. AVOID very hot or cold water. Keep bathing time to 5-10 minutes. Do NOT use bubble bath. Use a mild soap and use just enough to wash the dirty areas. Do NOT scrub skin vigorously.  After bathing, pat dry your skin with a towel. Do NOT rub or scrub the skin.  Moisturizers and prescriptions:  ALWAYS apply moisturizers immediately after bathing (within 3 minutes). This helps to lock-in moisture. Use the moisturizer several times a day over the whole body. Good summer moisturizers include: Aveeno, CeraVe, Cetaphil. Good winter moisturizers include: Aquaphor, Vaseline, Cerave, Cetaphil, Eucerin, Vanicream. When using moisturizers along with medications, the moisturizer should be applied about one hour after applying the medication to prevent diluting effect of the medication or moisturize around where you applied the medications. When not using medications, the moisturizer can be continued twice daily as maintenance.  Laundry and clothing: Avoid laundry products with added color or perfumes. Use unscented hypo-allergenic laundry products such as Tide free, Cheer free & gentle, and All free and clear.  If the skin still seems dry or sensitive, you can try double-rinsing the clothes. Avoid tight or scratchy clothing such as wool. Do not use fabric softeners or dyer sheets.

## 2021-11-20 DIAGNOSIS — G4719 Other hypersomnia: Secondary | ICD-10-CM

## 2021-11-20 NOTE — Progress Notes (Signed)
@Patient  ID: , male    DOB: October 30, 1980, 41 y.o.   MRN: [id number]  Chief Complaint  Patient presents with   Follow-up    Discuss Sleep Study    Referring provider: [id number], NP  HPI: 41 year old male, former smoker. PMH HTN, paroxysmal atrial tachycardia, s/p repair ascending aortic dissection.   10/03/2021 Patient presents today for sleep consult. He has symptoms of loud snoring and daytime sleepiness. He appears tired today. He typically gets 5-6 hours of sleep a night but does not feel rested the next day. He wakes up on average one-two times a night to use the restroom. He takes a nap most days. He has two children, ages 59 and 57, and has one on the way. He has a family hx sleep apnea. No prior sleep study. Denies narcolepsy, cataplexy or sleep walking.  Sleep questionnaire Symptoms- Stops breathing while sleeping  Prior sleep study- None Bedtime- 11pm-3am Time to fall asleep- varies, depends on how tired he is  Nocturnal awakenings- once or twice Out of bed/start of day- 7am  Weight changes- 20 lbs in 4 years  Do you operate heavy machinery- No Do you currently wear CPAP- No Do you current wear oxygen- No Epworth- 10   11/21/2021 Patient presents today to review sleep study results.  Patient has symptoms of loud snoring and daytime sleepiness.  NPSG sleep study on 11/08/2021 showed no evidence of obstructive sleep apnea, AHI 2.1/hr (RDI was 6.8/hr) with min SPO2 86%.  Patient had moderate snoring. Reviewed sleep study results with patient.  He is feels that his fatigue is related to work and life stress.  Urged him to maintain normal sleep schedule and aim to get 6 to 8 hours of sleep.  He is open to seeing orthodontics to be evaluated for oral appliance to help with snoring.  He is motivated to lose about 20 pounds.  No Known Allergies  Immunization History  Administered Date(s) Administered   Td 10/25/2000   Tdap 04/17/2018    Past Medical History:   Diagnosis Date   Acute thoracic aortic dissection (HCC) 03/10/2018   COVID 04/2020   Hypertension    S/P aortic dissection repair 03/11/2018   Straight graft replacement of ascending thoracic aorta with resuspension of native aortic valve and open hemi-arch distal anastomosis    Tobacco History: Social History   Tobacco Use  Smoking Status Former   Packs/day: 0.50   Types: Cigarettes  Smokeless Tobacco Never   Counseling given: Not Answered   Outpatient Medications Prior to Visit  Medication Sig Dispense Refill   amLODipine (NORVASC) 10 MG tablet Take 1 tablet by mouth once daily 90 tablet 0   lisinopril-hydrochlorothiazide (ZESTORETIC) 20-25 MG tablet Take 1 tablet by mouth daily. 90 tablet 3   metoprolol (TOPROL-XL) 200 MG 24 hr tablet TAKE 1 TABLET BY MOUTH ONCE DAILY WITH MEALS OR IMMEDIATELY FOLLOWING A MEAL 90 tablet 1   aspirin EC 81 MG tablet Take 81 mg by mouth daily. (Patient not taking: Reported on 11/21/2021)     cyclobenzaprine (FLEXERIL) 5 MG tablet Take 1 tablet (5 mg total) by mouth 2 (two) times daily as needed for muscle spasms. (Patient not taking: Reported on 11/21/2021) 20 tablet 0   No facility-administered medications prior to visit.    Review of Systems  Review of Systems  Constitutional:  Positive for fatigue.  HENT: Negative.    Respiratory: Negative.    Psychiatric/Behavioral: Negative.     Physical  Exam  BP 140/70 (BP Location: Left Arm, Cuff Size: Large)   Pulse 78   Temp 98.6 F (37 C) (Temporal)   Ht 6\' 1"  (1.854 m)   Wt 243 lb 6.4 oz (110.4 kg)   SpO2 100%   BMI 32.11 kg/m  Physical Exam Constitutional:      General: He is not in acute distress.    Appearance: Normal appearance. He is not ill-appearing.  HENT:     Head: Normocephalic and atraumatic.  Cardiovascular:     Rate and Rhythm: Normal rate and regular rhythm.  Pulmonary:     Effort: Pulmonary effort is normal.     Breath sounds: Normal breath sounds.   Musculoskeletal:        General: Normal range of motion.  Skin:    General: Skin is warm and dry.  Neurological:     General: No focal deficit present.     Mental Status: He is alert and oriented to person, place, and time. Mental status is at baseline.  Psychiatric:        Mood and Affect: Mood normal.        Behavior: Behavior normal.        Thought Content: Thought content normal.        Judgment: Judgment normal.      Lab Results:  CBC    Component Value Date/Time   WBC 6.5 06/29/2021 1049   WBC 14.2 (H) 03/23/2018 1600   RBC 5.17 06/29/2021 1049   RBC 2.89 (L) 03/23/2018 1600   HGB 15.4 06/29/2021 1049   HCT 45.7 06/29/2021 1049   PLT 310 06/29/2021 1049   MCV 88 06/29/2021 1049   MCH 29.8 06/29/2021 1049   MCH 29.1 03/23/2018 1600   MCHC 33.7 06/29/2021 1049   MCHC 30.3 03/23/2018 1600   RDW 12.5 06/29/2021 1049   LYMPHSABS 2.2 06/29/2021 1049   MONOABS 0.7 12/23/2009 1318   EOSABS 0.1 06/29/2021 1049   BASOSABS 0.1 06/29/2021 1049    BMET    Component Value Date/Time   NA 139 06/29/2021 1049   K 4.7 06/29/2021 1049   CL 103 06/29/2021 1049   CO2 22 03/03/2019 1145   GLUCOSE 104 (H) 06/29/2021 1049   GLUCOSE 91 03/23/2018 1600   BUN 14 06/29/2021 1049   CREATININE 1.14 06/29/2021 1049   CALCIUM 9.6 06/29/2021 1049   GFRNONAA 80 03/03/2020 0859   GFRAA 92 03/03/2020 0859    BNP    Component Value Date/Time   BNP 519.7 (H) 03/23/2018 1600    ProBNP No results found for: "PROBNP"  Imaging: SLEEP STUDY DOCUMENTS  Result Date: 11/20/2021 Ordered by an unspecified provider.  Split night study  Result Date: 11/08/2021 11/10/2021, MD     11/20/2021  3:49 PM Patient Name: Derrick Clayton, Derrick Clayton Date: 11/08/2021 Gender: Male D.O.B: 12-02-1980 Age (years): 11 Referring Provider: 45 NP Height (inches): 72 Interpreting Physician: Ames Dura MD, ABSM Weight (lbs): 244 RPSGT: Cyril Mourning BMI: 33 MRN: Armen Pickup Neck Size: 17.50 <br> <br>  CLINICAL INFORMATION Sleep Study Type: NPSG Indication for sleep study: Excessive Daytime Sleepiness, snoring Epworth Sleepiness Score: 7 SLEEP STUDY TECHNIQUE As per the AASM Manual for the Scoring of Sleep and Associated Events v2.3 (April 2016) with a hypopnea requiring 4% desaturations. The channels recorded and monitored were frontal, central and occipital EEG, electrooculogram (EOG), submentalis EMG (chin), nasal and oral airflow, thoracic and abdominal wall motion, anterior tibialis EMG, snore microphone, electrocardiogram, and pulse oximetry. MEDICATIONS  Medications self-administered by patient taken the night of the study : N/A SLEEP ARCHITECTURE The study was initiated at 10:28:26 PM and ended at 4:21:43 AM. Sleep onset time was 14.2 minutes and the sleep efficiency was 89.9%%. The total sleep time was 317.5 minutes. Stage REM latency was 13.5 minutes. The patient spent 4.3%% of the night in stage N1 sleep, 65.2%% in stage N2 sleep, 0.0%% in stage N3 and 30.6% in REM. Alpha intrusion was absent. Supine sleep was 0.03%. RESPIRATORY PARAMETERS The overall apnea/hypopnea index (AHI) was 2.1 per hour. There were 2 total apneas, including 1 obstructive, 1 central and 0 mixed apneas. There were 9 hypopneas and 25 RERAs. The AHI during Stage REM sleep was 3.7 per hour. AHI while supine was 0.0 per hour. The mean oxygen saturation was 93.6%. The minimum SpO2 during sleep was 86.0%. moderate snoring was noted during this study. CARDIAC DATA The 2 lead EKG demonstrated sinus rhythm. The mean heart rate was 71.1 beats per minute. Other EKG findings include: None. LEG MOVEMENT DATA The total PLMS were 0 with a resulting PLMS index of 0.0. Associated arousal with leg movement index was 0.4 . IMPRESSIONS - No significant obstructive sleep apnea occurred during this study (AHI = 2.1/h). RDI was 6.8/h - No significant central sleep apnea occurred during this study (CAI = 0.2/h). - Mild oxygen desaturation was noted during  this study (Min O2 = 86.0%). - The patient snored with moderate snoring volume. - No cardiac abnormalities were noted during this study. - Clinically significant periodic limb movements did not occur during sleep. No significant associated arousals. - Short REM latency DIAGNOSIS - Upper airway resistance syndrome RECOMMENDATIONS - Consider other causes of hypersomnolence such as medications, short REM latency may be due to insufficient sleep time or narcolepsy. Please consider sleep diary - Avoid alcohol, sedatives and other CNS depressants that may worsen sleep apnea and disrupt normal sleep architecture. - Sleep hygiene should be reviewed to assess factors that may improve sleep quality. - Weight management and regular exercise should be initiated or continued if appropriate. Cyril Mourning MD Board Certified in Sleep medicine     Assessment & Plan:   Upper airway resistance syndrome - Sleep study on 11/08/2021 showed no evidence of obstructive sleep apnea, AHI 2.1 with SPO2 lowest 86%.  He had moderate snoring. Referring patient to orthodontics to be evaluated for oral appliance to help with snoring.  Strongly encouraged weight loss efforts.  Advised patient aim to get 6 to 8 hours of sleep at night and maintain normal sleep schedule.  Recommend avoiding alcohol, sedatives or depressants that may worsen sleep apnea or disrupt normal sleep architecture.  Follow-up if sleep symptoms worsen.     Glenford Bayley, NP 11/21/2021

## 2021-11-21 ENCOUNTER — Ambulatory Visit (INDEPENDENT_AMBULATORY_CARE_PROVIDER_SITE_OTHER): Payer: 59 | Admitting: Primary Care

## 2021-11-21 ENCOUNTER — Encounter: Payer: Self-pay | Admitting: Primary Care

## 2021-11-21 VITALS — BP 140/70 | HR 78 | Temp 98.6°F | Ht 73.0 in | Wt 243.4 lb

## 2021-11-21 DIAGNOSIS — G478 Other sleep disorders: Secondary | ICD-10-CM

## 2021-11-21 DIAGNOSIS — R0683 Snoring: Secondary | ICD-10-CM

## 2021-11-21 NOTE — Progress Notes (Signed)
Reviewed and agree with assessment/plan.   Coralyn Helling, MD [facility] Pulmonary/Critical Care 11/21/2021, 9:41 AM Pager:  [phone]

## 2021-11-21 NOTE — Patient Instructions (Signed)
  Sleep study on 11/08/2021 showed no evidence of obstructive sleep apnea, on average you had 2.1 apneic/hypopneic events an hour. You had moderate snoring.  Mild OSA 5-15 apneic events an hour Moderate OSA 15-30 apneic events an hour Severe OSA > 30 apneic events an hour  Recommend avoiding alcohol, sedatives or depressants that may worsen sleep apnea or to strep normal sleep architecture. Encourage weight loss efforts.  Aim to get 6 to 8 hours of sleep at night.  You can take naps during the daytime to help with fatigue.  Encourage daily physical exercise.  Focus on side sleeping position or elevate head of bed 30 degrees to help with snoring and occasional apneas  We will refer you to orthodontics to be evaluated for oral appliance that could help with snoring  Referral Dr. Irene Limbo [phone]  Follow-up Return if sleep symptoms worsen

## 2021-11-21 NOTE — Assessment & Plan Note (Addendum)
-   Sleep study on 11/08/2021 showed no evidence of obstructive sleep apnea, AHI 2.1 with SPO2 lowest 86%.  He had moderate snoring. Referring patient to orthodontics to be evaluated for oral appliance to help with snoring.  Strongly encouraged weight loss efforts.  Advised patient aim to get 6 to 8 hours of sleep at night and maintain normal sleep schedule.  Recommend avoiding alcohol, sedatives or depressants that may worsen sleep apnea or disrupt normal sleep architecture.  Follow-up if sleep symptoms worsen.

## 2021-11-22 ENCOUNTER — Ambulatory Visit: Payer: 59 | Admitting: Nurse Practitioner

## 2022-01-01 ENCOUNTER — Other Ambulatory Visit: Payer: Self-pay | Admitting: Nurse Practitioner

## 2022-04-11 ENCOUNTER — Encounter (HOSPITAL_COMMUNITY): Payer: Self-pay | Admitting: Emergency Medicine

## 2022-04-11 ENCOUNTER — Ambulatory Visit (HOSPITAL_COMMUNITY)
Admission: EM | Admit: 2022-04-11 | Discharge: 2022-04-11 | Disposition: A | Discharge status: Home / Self Care | Payer: 59 | Attending: Family Medicine | Admitting: Family Medicine

## 2022-04-11 ENCOUNTER — Other Ambulatory Visit: Payer: Self-pay

## 2022-04-11 DIAGNOSIS — R300 Dysuria: Secondary | ICD-10-CM | POA: Insufficient documentation

## 2022-04-11 LAB — POCT URINALYSIS DIPSTICK, ED / UC
Bilirubin Urine: NEGATIVE
Glucose, UA: NEGATIVE mg/dL
Ketones, ur: NEGATIVE mg/dL
Leukocytes,Ua: NEGATIVE
Nitrite: NEGATIVE
Protein, ur: NEGATIVE mg/dL
Specific Gravity, Urine: 1.025 (ref 1.005–1.030)
Urobilinogen, UA: 0.2 mg/dL (ref 0.0–1.0)
pH: 5.5 (ref 5.0–8.0)

## 2022-04-11 NOTE — ED Provider Notes (Signed)
[facility]    CSN: [id number] Arrival date & time: 04/11/22  1741      History   Chief Complaint Chief Complaint  Patient presents with   Dysuria    HPI Derrick Clayton is a 41 y.o. male.    Dysuria Presenting symptoms: dysuria    Here for dysuria that began November 13.  No fever and no chills.  No urinary frequency, no rectal pain, and no nausea or vomiting or diarrhea.   No flank pain  He does not have any known exposures.  He requests 2 pills and a shot like he got 3 years ago; at that time his STD testing was negative for gonorrhea, chlamydia, and trichomonas  Then he says he thinks he has a UTI  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 and open hemi-arch distal anastomosis    Patient Active Problem List   Diagnosis Date Noted   Rash and other nonspecific skin eruption 11/08/2021   Positive autoantibody screening for celiac disease 11/08/2021   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   Low back pain 09/14/2021   Cutaneous abscess of face 08/22/2021   Thoracic aortic aneurysm without rupture (HCC) 09/25/2020   History of aortic dissection 07/02/2018   History of paroxysmal atrial tachycardia 07/02/2018   S/P repair ascending aortic dissection 03/11/2018   Uncontrolled hypertension 03/10/2018   TOBACCO DEPENDENCE 07/24/2006   Essential hypertension 07/24/2006    Past Surgical History:  Procedure Laterality Date   AORTIC INTERVENTION N/A 03/10/2018   Procedure: RESUSPENSION OF THE NATIVE AORTIC VALVE;  Surgeon: Purcell Nails, MD;  Location: [facility] OR;  Service: Open Heart Surgery;  Laterality: N/A;   AORTIC VALVE REPAIR N/A 03/10/2018   Procedure: REPAIR OF ASCENDING AORTIC DISECTION USING STRAIGHT HEMASHIELD PLATINUM  VASCULAR GRAFT; OPEN HEMIARCH DISTAL ANASTOMOSIS;  Surgeon: Purcell Nails, MD;  Location: [facility] OR;  Service: Open Heart Surgery;  Laterality: N/A;   FOOT SURGERY         Home Medications    Prior to Admission medications   Medication Sig Start Date End Date Taking? Authorizing Provider  amLODipine (NORVASC) 10 MG tablet Take 1 tablet by mouth once daily 01/02/22   Ivonne Andrew, NP  aspirin EC 81 MG tablet Take 81 mg by mouth daily. Patient not taking: Reported on 11/21/2021    [provider]  cyclobenzaprine (FLEXERIL) 5 MG tablet Take 1 tablet (5 mg total) by mouth 2 (two) times daily as needed for muscle spasms. Patient not taking: Reported on 11/21/2021 09/10/21   Gustavus Bryant, FNP  lisinopril-hydrochlorothiazide (ZESTORETIC) 20-25 MG tablet Take 1 tablet by mouth daily. 11/02/21   Jodelle Red, MD  metoprolol (TOPROL-XL) 200 MG 24 hr tablet TAKE 1 TABLET BY MOUTH ONCE DAILY WITH MEALS OR IMMEDIATELY FOLLOWING A MEAL 10/15/21   Jodelle Red, MD    Family History Family History  Problem Relation Age of Onset   Cancer Mother    Heart failure Father    Asthma Son    Allergic rhinitis Neg Hx    Eczema Neg Hx    Urticaria Neg Hx     Social History Social History   Tobacco Use   Smoking status: Former    Packs/day: 0.50    Types: Cigarettes  Smokeless tobacco: Never  Vaping Use   Vaping Use: Never used  Substance Use Topics   Alcohol use: Yes    Comment: 3-4x a week   Drug use: Yes    Frequency: 3.0 times per week    Types: Marijuana     Allergies   Patient has no known allergies.   Review of Systems Review of Systems  Genitourinary:  Positive for dysuria.     Physical Exam Triage Vital Signs ED Triage Vitals  Enc Vitals Group     BP 04/11/22 1847 (!) 183/94     Pulse Rate 04/11/22 1847 (!) 108     Resp 04/11/22 1847 20     Temp 04/11/22 1847 98.3 F (36.8 C)     Temp Source 04/11/22 1847 Oral     SpO2 04/11/22 1847 99 %      Weight --      Height --      Head Circumference --      Peak Flow --      Pain Score 04/11/22 1844 8     Pain Loc --      Pain Edu? --      Excl. in GC? --    No data found.  Updated Vital Signs BP (!) 183/94 (BP Location: Left Arm) Comment: did not take medicines today Comment (BP Location): large cuff  Pulse (!) 108   Temp 98.3 F (36.8 C) (Oral)   Resp 20   SpO2 99%   Visual Acuity Right Eye Distance:   Left Eye Distance:   Bilateral Distance:    Right Eye Near:   Left Eye Near:    Bilateral Near:     Physical Exam Vitals reviewed.  Constitutional:      General: He is not in acute distress.    Appearance: He is not ill-appearing, toxic-appearing or diaphoretic.  Cardiovascular:     Rate and Rhythm: Normal rate.  Skin:    Coloration: Skin is not pale.  Neurological:     General: No focal deficit present.     Mental Status: He is oriented to person, place, and time.  Psychiatric:        Behavior: Behavior normal.      UC Treatments / Results  Labs (all labs ordered are listed, but only abnormal results are displayed) Labs Reviewed  POCT URINALYSIS DIPSTICK, ED / UC - Abnormal; Notable for the following components:      Result Value   Hgb urine dipstick SMALL (*)    All other components within normal limits  URINE CULTURE  CYTOLOGY, (ORAL, ANAL, URETHRAL) ANCILLARY ONLY    EKG   Radiology No results found.  Procedures Procedures (including critical care time)  Medications Ordered in UC Medications - No data to display  Initial Impression / Assessment and Plan / UC Course  I have reviewed the triage vital signs and the nursing notes.  Pertinent labs & imaging results that were available during my care of the patient were reviewed by me and considered in my medical decision making (see chart for details).        Urinalysis shows a small amount of blood, but no leuks and no protein or nitrites.  Urine culture is sent and self swab was  done.   Patient is angry that we are not doing the same treatment that was done empirically in October 2020.  We have discussed why we are not treating at this time.  Vital signs are reassuring  as far as there is no hypotension and no fever.  He does not have any symptoms typical of prostatitis, and therefore I am not going to treat empirically for UTI in this age male. Final Clinical Impressions(s) / UC Diagnoses   Final diagnoses:  Dysuria     Discharge Instructions      The urinalysis had a tiny amount of blood.  This can be normal, but we have sent urine culture to see if there is a urinary infection  Staff will notify you if there is anything positive on your swab that needs treatment for infection   ED Prescriptions   None    PDMP not reviewed this encounter.   Zenia Resides, MD 04/11/22 [phone]

## 2022-04-11 NOTE — Discharge Instructions (Signed)
The urinalysis had a tiny amount of blood.  This can be normal, but we have sent urine culture to see if there is a urinary infection  Staff will notify you if there is anything positive on your swab that needs treatment for infection

## 2022-04-11 NOTE — ED Triage Notes (Signed)
Painful urination since Monday.  Patient thinks he has seen discharge

## 2022-04-11 NOTE — Medical Student Note (Addendum)
[facility] Insurance account manager Note For educational purposes for Medical, PA and NP students only and not part of the legal medical record.   CSN: [id number] Arrival date & time: 04/11/22  1741      History   Chief Complaint Chief Complaint  Patient presents with   Dysuria    HPI Derrick Clayton is a 41 y.o. male.  Patient reports new pain with urination starting on Monday, and believes he has a UTI. Describes the pain as a burning pain felt in his abdomen and penile shaft on urination. Denies fever, rash/discharge/itching/pain/swelling/lesion to genital areas, changes in bowel/bladder habits, urinary urgency/frequency/hesitancy, N/V/D. Patient states he's been with the same male partner for the last year, and he's here to make sure she didn't give him an STI. Does not appear to be in acute distress. Patient is requesting we give him a shot and two pills like he received the last time he was here.   Dysuria Presenting symptoms: dysuria and penile pain   Presenting symptoms: no penile discharge   Associated symptoms: abdominal pain   Associated symptoms: no diarrhea, no fever, no hematuria, no nausea, no penile swelling, no scrotal swelling, no urinary frequency and no vomiting     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 and open hemi-arch distal anastomosis    Patient Active Problem List   Diagnosis Date Noted   Rash and other nonspecific skin eruption 11/08/2021   Positive autoantibody screening for celiac disease 11/08/2021   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   Low back pain 09/14/2021   Cutaneous abscess of face 08/22/2021   Thoracic aortic aneurysm without rupture (HCC) 09/25/2020   History of aortic dissection 07/02/2018    History of paroxysmal atrial tachycardia 07/02/2018   S/P repair ascending aortic dissection 03/11/2018   Uncontrolled hypertension 03/10/2018   TOBACCO DEPENDENCE 07/24/2006   Essential hypertension 07/24/2006    Past Surgical History:  Procedure Laterality Date   AORTIC INTERVENTION N/A 03/10/2018   Procedure: RESUSPENSION OF THE NATIVE AORTIC VALVE;  Surgeon: Purcell Nails, MD;  Location: [facility] OR;  Service: Open Heart Surgery;  Laterality: N/A;   AORTIC VALVE REPAIR N/A 03/10/2018   Procedure: REPAIR OF ASCENDING AORTIC DISECTION USING STRAIGHT HEMASHIELD PLATINUM VASCULAR GRAFT; OPEN HEMIARCH DISTAL ANASTOMOSIS;  Surgeon: Purcell Nails, MD;  Location: [facility] OR;  Service: Open Heart Surgery;  Laterality: N/A;   FOOT SURGERY         Home Medications    Prior to Admission medications   Medication Sig Start Date End Date Taking? Authorizing Provider  amLODipine (NORVASC) 10 MG tablet Take 1 tablet by mouth once daily 01/02/22   Ivonne Andrew, NP  aspirin EC 81 MG tablet Take 81 mg by mouth daily. Patient not taking: Reported on 11/21/2021    [provider]  cyclobenzaprine (FLEXERIL) 5 MG tablet Take 1 tablet (5 mg total) by mouth 2 (two) times daily as needed for muscle spasms. Patient not taking: Reported on 11/21/2021 09/10/21   Gustavus Bryant, FNP  lisinopril-hydrochlorothiazide (ZESTORETIC) 20-25 MG tablet Take 1 tablet by mouth daily. 11/02/21   Jodelle Red, MD  metoprolol (TOPROL-XL) 200 MG 24 hr tablet TAKE 1 TABLET BY MOUTH ONCE DAILY WITH MEALS OR IMMEDIATELY FOLLOWING A MEAL  10/15/21   Buford Dresser, MD    Family History Family History  Problem Relation Age of Onset   Cancer Mother    Heart failure Father    Asthma Son    Allergic rhinitis Neg Hx    Eczema Neg Hx    Urticaria Neg Hx     Social History Social History   Tobacco Use   Smoking status: Former    Packs/day: 0.50    Types: Cigarettes   Smokeless tobacco: Never   Vaping Use   Vaping Use: Never used  Substance Use Topics   Alcohol use: Yes    Comment: 3-4x a week   Drug use: Yes    Frequency: 3.0 times per week    Types: Marijuana     Allergies   Patient has no known allergies.   Review of Systems Review of Systems  Constitutional:  Negative for appetite change, chills, fatigue and fever.  Gastrointestinal:  Positive for abdominal pain. Negative for abdominal distention, anal bleeding, blood in stool, diarrhea, nausea, rectal pain and vomiting.  Genitourinary:  Positive for dysuria and penile pain. Negative for difficulty urinating, frequency, hematuria, penile discharge, penile swelling, scrotal swelling, testicular pain and urgency.     Physical Exam Updated Vital Signs BP (!) 183/94 (BP Location: Left Arm) Comment: did not take medicines today Comment (BP Location): large cuff  Pulse (!) 108   Temp 98.3 F (36.8 C) (Oral)   Resp 20   SpO2 99%   Physical Exam Vitals and nursing note reviewed.  Constitutional:      Appearance: Normal appearance.  Abdominal:     General: Abdomen is flat. There is no distension.     Palpations: Abdomen is soft. There is no mass.     Tenderness: There is no abdominal tenderness. There is no guarding or rebound.     Hernia: No hernia is present.  Genitourinary:    Comments: Deferred due to patient stating no change from normal. Skin:    General: Skin is warm and dry.  Neurological:     General: No focal deficit present.     Mental Status: He is alert and oriented to person, place, and time. Mental status is at baseline.      ED Treatments / Results  Labs (all labs ordered are listed, but only abnormal results are displayed) Labs Reviewed  POCT URINALYSIS DIPSTICK, ED / UC - Abnormal; Notable for the following components:      Result Value   Hgb urine dipstick SMALL (*)    All other components within normal limits  URINE CULTURE  CYTOLOGY, (ORAL, ANAL, URETHRAL) ANCILLARY ONLY     EKG  Radiology No results found.  Procedures Procedures (including critical care time)  Medications Ordered in ED Medications - No data to display   Initial Impression / Assessment and Plan / ED Course  I have reviewed the triage vital signs and the nursing notes.  Pertinent labs & imaging results that were available during my care of the patient were reviewed by me and considered in my medical decision making (see chart for details).     Patient requesting an antibiotic injection and two pills like he received proactively three years ago. Informed patient that without any known exposure to STI or any definitive symptoms that we cannot treat empirically. Negative UA in clinic today. Will let him know about treatment options once we receive cytology swab and urine culture.  Recommended symptom management with AZO tablets he can buy  OTC in the interim. Discussed ER protocols, follow up with PCP if no improvement or worsening of symptoms.  Final Clinical Impressions(s) / ED Diagnoses   Final diagnoses:  Dysuria    New Prescriptions New Prescriptions   No medications on file

## 2022-04-13 LAB — URINE CULTURE: Culture: NO GROWTH

## 2022-04-15 LAB — CYTOLOGY, (ORAL, ANAL, URETHRAL) ANCILLARY ONLY
Chlamydia: NEGATIVE
Comment: NEGATIVE
Comment: NEGATIVE
Comment: NORMAL
Neisseria Gonorrhea: NEGATIVE
Trichomonas: NEGATIVE

## 2022-06-26 ENCOUNTER — Encounter: Payer: Self-pay | Admitting: Nurse Practitioner

## 2022-06-26 ENCOUNTER — Ambulatory Visit (INDEPENDENT_AMBULATORY_CARE_PROVIDER_SITE_OTHER): Payer: 59 | Admitting: Nurse Practitioner

## 2022-06-26 VITALS — BP 119/69 | HR 78 | Temp 97.6°F

## 2022-06-26 DIAGNOSIS — I10 Essential (primary) hypertension: Secondary | ICD-10-CM | POA: Diagnosis not present

## 2022-06-26 DIAGNOSIS — U071 COVID-19: Secondary | ICD-10-CM

## 2022-06-26 LAB — POC SOFIA 2 FLU + SARS ANTIGEN FIA
Influenza A, POC: NEGATIVE
Influenza B, POC: NEGATIVE
SARS Coronavirus 2 Ag: POSITIVE — AB

## 2022-06-26 MED ORDER — BENZONATATE 100 MG PO CAPS: 100.0000 mg | ORAL_CAPSULE | Freq: Three times a day (TID) | ORAL | 0 refills | Status: DC | PRN

## 2022-06-26 MED ORDER — MOLNUPIRAVIR EUA 200MG CAPSULE: 4.0000 | ORAL_CAPSULE | Freq: Two times a day (BID) | ORAL | 0 refills | Status: AC

## 2022-06-26 NOTE — Assessment & Plan Note (Signed)
BP Readings from Last 3 Encounters:  06/26/22 119/69  04/11/22 (!) 183/94  11/21/21 140/70  Currently well-controlled on amlodipine 10 mg daily, lisinopril-hydrochlorothiazide 20-25 mg 1 tablet daily, metoprolol 200 mg daily Continue current medications DASH diet advised engage in regular moderate exercises at least 150 minutes weekly as tolerated Follow-up in 4 weeks for annual physical with labs

## 2022-06-26 NOTE — Assessment & Plan Note (Signed)
1. COVID-19 virus infection No recent CMP  - POC SOFIA 2 FLU + SARS ANTIGEN FIA - molnupiravir EUA (LAGEVRIO) 200 mg CAPS capsule; Take 4 capsules (800 mg total) by mouth 2 (two) times daily for 5 days.  Dispense: 40 capsule; Refill: 0 - benzonatate (TESSALON PERLES) 100 MG capsule; Take 1 capsule (100 mg total) by mouth 3 (three) times daily as needed for cough.  Dispense: 15 capsule; Refill: 0  Tylenol 650 mg as needed for fever, body aches Drink at least 64 ounces of water daily to maintain hydration Importance of proper hand washing hygiene, send on isolation for 5 days discussed. Patient encouraged to consider getting COVID-vaccine and flu vaccine once he is fully recovered.

## 2022-06-26 NOTE — Patient Instructions (Signed)
-  COVID-19 virus infection  - molnupiravir EUA (LAGEVRIO) 200 mg CAPS capsule; Take 4 capsules (800 mg total) by mouth 2 (two) times daily for 5 days.     benzonatate (TESSALON PERLES) 100 MG capsule; Take 1 capsule (100 mg total) by mouth 3 (three) times daily as needed for cough.    Take tylenol 650 mg every 6 hours as needed for fever, headache. Please stay on isolation for 5 days and practise good hand washing hygiene.    It is important that you exercise regularly at least 30 minutes 5 times a week as tolerated  Think about what you will eat, plan ahead. Choose " clean, green, fresh or frozen" over canned, processed or packaged foods which are more sugary, salty and fatty. 70 to 75% of food eaten should be vegetables and fruit. Three meals at set times with snacks allowed between meals, but they must be fruit or vegetables. Aim to eat over a 12 hour period , example 7 am to 7 pm, and STOP after  your last meal of the day. Drink water,generally about 64 ounces per day, no other drink is as healthy. Fruit juice is best enjoyed in a healthy way, by EATING the fruit.  Thanks for choosing [facility] we consider it a privelige to serve you.

## 2022-06-26 NOTE — Progress Notes (Signed)
Acute Office Visit  Subjective:     Patient ID: Derrick Clayton, male    DOB: Dec 23, 1980, 42 y.o.   MRN: [id number]  Chief Complaint  Patient presents with   Fever    X 3/4 days, has a cough, denies headache and sore throat, and body aches, hasn't  used a thermometer to check he doesn't have one.     Fever  Associated symptoms include coughing. Pertinent negatives include no abdominal pain, chest pain, diarrhea, ear pain, headaches, nausea, vomiting or wheezing.   Mr. Lebarron with past medical history of hypertension, thoracic aortic aneurysm without rupture, tobacco dependence, low back pain, history of paroxysmal atrial tachycardia presents for complaints of productive cough with whitish colored sputum , fever that started about 4 days ago.  He denies sore throat, headache, dizziness, wheezing, shortness of breath, bloody, sneezing, stuffy nose.  He denies known COVID contacts.  He did not receive the flu and Covid vaccines.     Review of Systems  Constitutional:  Positive for fever. Negative for chills, diaphoresis, malaise/fatigue and weight loss.  HENT:  Negative for ear discharge, ear pain, hearing loss and tinnitus.   Respiratory:  Positive for cough. Negative for hemoptysis, sputum production, shortness of breath and wheezing.   Cardiovascular:  Negative for chest pain, palpitations, orthopnea, claudication and leg swelling.  Gastrointestinal:  Negative for abdominal pain, diarrhea, heartburn, nausea and vomiting.  Neurological: Negative.  Negative for dizziness, tingling, tremors and headaches.  Psychiatric/Behavioral: Negative.  Negative for depression, hallucinations, substance abuse and suicidal ideas.         Objective:    BP 119/69   Pulse 78   Temp 97.6 F (36.4 C) (Temporal)   SpO2 100%    Physical Exam Constitutional:      General: He is not in acute distress.    Appearance: Normal appearance. He is not ill-appearing, toxic-appearing or diaphoretic.  HENT:      Nose: No congestion or rhinorrhea.     Mouth/Throat:     Mouth: Mucous membranes are moist.     Pharynx: No oropharyngeal exudate or posterior oropharyngeal erythema.  Eyes:     General: No scleral icterus.       Right eye: No discharge.        Left eye: No discharge.     Extraocular Movements: Extraocular movements intact.  Cardiovascular:     Rate and Rhythm: Normal rate and regular rhythm.     Pulses: Normal pulses.     Heart sounds: Normal heart sounds. No murmur heard.    No friction rub. No gallop.  Pulmonary:     Effort: Pulmonary effort is normal. No respiratory distress.     Breath sounds: Normal breath sounds. No stridor. No wheezing, rhonchi or rales.  Chest:     Chest wall: No tenderness.  Abdominal:     General: There is no distension.     Palpations: Abdomen is soft.     Tenderness: There is no abdominal tenderness. There is no guarding.  Skin:    General: Skin is warm and dry.  Neurological:     Mental Status: He is alert and oriented to person, place, and time.     Cranial Nerves: No cranial nerve deficit.     Motor: No weakness.     Coordination: Coordination normal.     Gait: Gait normal.  Psychiatric:        Mood and Affect: Mood normal.        Behavior:  Behavior normal.        Thought Content: Thought content normal.        Judgment: Judgment normal.     Results for orders placed or performed in visit on 06/26/22  POC SOFIA 2 FLU + SARS ANTIGEN FIA  Result Value Ref Range   Influenza A, POC Negative Negative   Influenza B, POC Negative Negative   SARS Coronavirus 2 Ag Positive (A) Negative        Assessment & Plan:   Problem List Items Addressed This Visit       Cardiovascular and Mediastinum   Essential hypertension (Chronic)    BP Readings from Last 3 Encounters:  06/26/22 119/69  04/11/22 (!) 183/94  11/21/21 140/70  Currently well-controlled on amlodipine 10 mg daily, lisinopril-hydrochlorothiazide 20-25 mg 1 tablet daily,  metoprolol 200 mg daily Continue current medications DASH diet advised engage in regular moderate exercises at least 150 minutes weekly as tolerated Follow-up in 4 weeks for annual physical with labs        Other   COVID-19 virus infection - Primary    1. COVID-19 virus infection No recent CMP  - POC SOFIA 2 FLU + SARS ANTIGEN FIA - molnupiravir EUA (LAGEVRIO) 200 mg CAPS capsule; Take 4 capsules (800 mg total) by mouth 2 (two) times daily for 5 days.  Dispense: 40 capsule; Refill: 0 - benzonatate (TESSALON PERLES) 100 MG capsule; Take 1 capsule (100 mg total) by mouth 3 (three) times daily as needed for cough.  Dispense: 15 capsule; Refill: 0  Tylenol 650 mg as needed for fever, body aches Drink at least 64 ounces of water daily to maintain hydration Importance of proper hand washing hygiene, send on isolation for 5 days discussed. Patient encouraged to consider getting COVID-vaccine and flu vaccine once he is fully recovered.       Relevant Medications   molnupiravir EUA (LAGEVRIO) 200 mg CAPS capsule   benzonatate (TESSALON PERLES) 100 MG capsule   Other Relevant Orders   POC SOFIA 2 FLU + SARS ANTIGEN FIA (Completed)    Meds ordered this encounter  Medications   molnupiravir EUA (LAGEVRIO) 200 mg CAPS capsule    Sig: Take 4 capsules (800 mg total) by mouth 2 (two) times daily for 5 days.    Dispense:  40 capsule    Refill:  0   benzonatate (TESSALON PERLES) 100 MG capsule    Sig: Take 1 capsule (100 mg total) by mouth 3 (three) times daily as needed for cough.    Dispense:  15 capsule    Refill:  0    Return in about 1 month (around 07/25/2022) for CPE.  Renee Rival, FNP

## 2022-07-24 ENCOUNTER — Encounter: Payer: Self-pay | Admitting: Nurse Practitioner

## 2022-07-24 ENCOUNTER — Ambulatory Visit (INDEPENDENT_AMBULATORY_CARE_PROVIDER_SITE_OTHER): Payer: PRIVATE HEALTH INSURANCE | Admitting: Nurse Practitioner

## 2022-07-24 VITALS — BP 124/63 | HR 73 | Temp 96.9°F | Ht 73.0 in | Wt 258.6 lb

## 2022-07-24 DIAGNOSIS — F1290 Cannabis use, unspecified, uncomplicated: Secondary | ICD-10-CM

## 2022-07-24 DIAGNOSIS — R42 Dizziness and giddiness: Secondary | ICD-10-CM | POA: Insufficient documentation

## 2022-07-24 DIAGNOSIS — E669 Obesity, unspecified: Secondary | ICD-10-CM | POA: Insufficient documentation

## 2022-07-24 DIAGNOSIS — Z0000 Encounter for general adult medical examination without abnormal findings: Secondary | ICD-10-CM | POA: Diagnosis not present

## 2022-07-24 DIAGNOSIS — I10 Essential (primary) hypertension: Secondary | ICD-10-CM

## 2022-07-24 DIAGNOSIS — Z130 Encounter for screening for diseases of the blood and blood-forming organs and certain disorders involving the immune mechanism: Secondary | ICD-10-CM

## 2022-07-24 DIAGNOSIS — K0889 Other specified disorders of teeth and supporting structures: Secondary | ICD-10-CM

## 2022-07-24 DIAGNOSIS — Z1321 Encounter for screening for nutritional disorder: Secondary | ICD-10-CM

## 2022-07-24 DIAGNOSIS — Z1329 Encounter for screening for other suspected endocrine disorder: Secondary | ICD-10-CM | POA: Diagnosis not present

## 2022-07-24 DIAGNOSIS — Z13228 Encounter for screening for other metabolic disorders: Secondary | ICD-10-CM

## 2022-07-24 DIAGNOSIS — E66811 Obesity, class 1: Secondary | ICD-10-CM

## 2022-07-24 MED ORDER — AMOXICILLIN 875 MG PO TABS: 875.0000 mg | ORAL_TABLET | Freq: Two times a day (BID) | ORAL | 0 refills | Status: AC

## 2022-07-24 MED ORDER — METOPROLOL SUCCINATE ER 200 MG PO TB24: ORAL_TABLET | ORAL | 1 refills | Status: DC

## 2022-07-24 MED ORDER — AMLODIPINE BESYLATE 10 MG PO TABS: 10.0000 mg | ORAL_TABLET | Freq: Every day | ORAL | 0 refills | Status: DC

## 2022-07-24 MED ORDER — LISINOPRIL-HYDROCHLOROTHIAZIDE 20-25 MG PO TABS: 1.0000 | ORAL_TABLET | Freq: Every day | ORAL | 3 refills | Status: DC

## 2022-07-24 NOTE — Assessment & Plan Note (Signed)
Need to avoid smoking marijuana discussed

## 2022-07-24 NOTE — Assessment & Plan Note (Signed)
-   amoxicillin (AMOXIL) 875 MG tablet; Take 1 tablet (875 mg total) by mouth 2 (two) times daily for 10 days.  Dispense: 20 tablet; Refill: 0  He was encouraged to follow-up with dentist may need to have 2 broken tooth removed

## 2022-07-24 NOTE — Assessment & Plan Note (Signed)
Wt Readings from Last 3 Encounters:  07/24/22 258 lb 9.6 oz (117.3 kg)  11/21/21 243 lb 6.4 oz (110.4 kg)  11/08/21 244 lb (110.7 kg)   Body mass index is 34.12 kg/m.  Need to engage in regular moderate exercises at least 150 minutes weekly discussed. Patient counseled on low-carb modified diet

## 2022-07-24 NOTE — Progress Notes (Signed)
Complete physical exam  Patient: Derrick Clayton   DOB: January 25, 1981   43 y.o. Male  MRN: [id number]  Subjective:    Chief Complaint  Patient presents with   Annual Exam    Christopherjose Janosik is a 42 y.o. 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 who presents today for a complete physical exam. He reports consuming a general diet. The patient does not participate in regular exercise at present.   He c/o of mild Intermittent Dizziness, light headedness , right arm numbness for about 6  months. He denies  no chest pain or dyspnea on exertion seizures, LOC.    He complained of tooth ache that gives him HA since the past 2 weeks, reports that the tooth is broken , he plans to make an appointment with the dentists. He denies fever, chills, malaise.   Due for flu vaccine need for flu vaccine discussed in the office today patient declined flu vaccine.  He remains tobacco free both smokes marijuana.  Need to quit smoking marijuana discussed.     Most recent fall risk assessment:    10/01/2021    8:51 AM  [place] in the past year? 0  Number falls in past yr: 0  Injury with Fall? 0  Risk for fall due to : No Fall Risks  Follow up Falls evaluation completed     Most recent depression screenings:    07/24/2022    1:20 PM 10/01/2021    8:51 AM  PHQ 2/9 Scores  PHQ - 2 Score 0 0  PHQ- 9 Score  1        Patient Care Team: Renee Rival, FNP as PCP - General (Nurse Practitioner) Buford Dresser, MD as PCP - Cardiology (Cardiology)   Outpatient Medications Prior to Visit  Medication Sig Note   aspirin EC 81 MG tablet Take 81 mg by mouth daily.    [DISCONTINUED] amLODipine (NORVASC) 10 MG tablet Take 1 tablet by mouth once daily    [DISCONTINUED] lisinopril-hydrochlorothiazide (ZESTORETIC) 20-25 MG tablet Take 1 tablet by mouth daily.     [DISCONTINUED] metoprolol (TOPROL-XL) 200 MG 24 hr tablet TAKE 1 TABLET BY MOUTH ONCE DAILY WITH MEALS OR IMMEDIATELY FOLLOWING A MEAL    benzonatate (TESSALON PERLES) 100 MG capsule Take 1 capsule (100 mg total) by mouth 3 (three) times daily as needed for cough. (Patient not taking: Reported on 07/24/2022) 07/24/2022: Rash    cyclobenzaprine (FLEXERIL) 5 MG tablet Take 1 tablet (5 mg total) by mouth 2 (two) times daily as needed for muscle spasms. (Patient not taking: Reported on 11/21/2021)    No facility-administered medications prior to visit.    Review of Systems  Constitutional: Negative.   HENT: Negative.    Eyes: Negative.   Respiratory: Negative.    Cardiovascular: Negative.   Gastrointestinal: Negative.   Genitourinary: Negative.   Musculoskeletal: Negative.   Skin: Negative.   Neurological:  Positive for dizziness and headaches. Negative for tingling, tremors, sensory change, speech change, focal weakness, seizures, loss of consciousness and weakness.  Psychiatric/Behavioral: Negative.            Objective:     BP 124/63   Pulse 73   Temp (!) 96.9 F (36.1 C)   Ht '6\' 1"'$  (1.854 m)   Wt 258 lb 9.6 oz (117.3 kg)   SpO2 98%   BMI 34.12 kg/m  Physical Exam Constitutional:      General: He is not in acute distress.    Appearance: Normal appearance. He is obese. He is not ill-appearing, toxic-appearing or diaphoretic.  HENT:     Head: Normocephalic and atraumatic.     Right Ear: Tympanic membrane, ear canal and external ear normal. There is no impacted cerumen.     Left Ear: Tympanic membrane, ear canal and external ear normal. There is no impacted cerumen.     Nose: Nose normal. No congestion or rhinorrhea.     Mouth/Throat:     Mouth: Mucous membranes are moist.     Pharynx: Oropharynx is clear. No oropharyngeal exudate or posterior oropharyngeal erythema.     Comments: Broken tooth noted on upper left side, tenderness on palpation of the gum, no swelling or  drainage noted.  Eyes:     General: No scleral icterus.       Right eye: No discharge.        Left eye: No discharge.     Extraocular Movements: Extraocular movements intact.  Neck:     Vascular: No carotid bruit.  Cardiovascular:     Rate and Rhythm: Normal rate and regular rhythm.     Pulses: Normal pulses.     Heart sounds: Normal heart sounds. No murmur heard.    No friction rub. No gallop.  Pulmonary:     Effort: Pulmonary effort is normal. No respiratory distress.     Breath sounds: Normal breath sounds. No stridor. No wheezing, rhonchi or rales.  Chest:     Chest wall: No tenderness.  Abdominal:     General: There is no distension.     Palpations: Abdomen is soft. There is no mass.     Tenderness: There is no abdominal tenderness. There is no right CVA tenderness, left CVA tenderness, guarding or rebound.     Hernia: No hernia is present.  Musculoskeletal:        General: No swelling, tenderness, deformity or signs of injury.     Cervical back: Normal range of motion and neck supple. No rigidity or tenderness.     Right lower leg: No edema.     Left lower leg: No edema.  Lymphadenopathy:     Cervical: No cervical adenopathy.  Skin:    Capillary Refill: Capillary refill takes less than 2 seconds.     Coloration: Skin is not jaundiced or pale.     Findings: No bruising, erythema, lesion or rash.  Neurological:     Mental Status: He is alert and oriented to person, place, and time.     Cranial Nerves: No cranial nerve deficit.     Sensory: No sensory deficit.     Motor: No weakness.     Coordination: Coordination normal.     Gait: Gait normal.     Deep Tendon Reflexes: Reflexes normal.  Psychiatric:        Mood and Affect: Mood normal.        Behavior: Behavior normal.        Thought Content: Thought content normal.        Judgment: Judgment normal.      No results found for any visits on 07/24/22.     Assessment & Plan:    Routine Health Maintenance and  Physical Exam  Immunization History  Administered Date(s) Administered   Td 10/25/2000   Tdap 04/17/2018    Health Maintenance  Topic Date Due   INFLUENZA VACCINE  08/25/2022 (Originally  12/25/2021)   Hepatitis C Screening  07/25/2023 (Originally 08/26/1997)   HIV Screening  07/25/2023 (Originally 08/27/1994)   DTaP/Tdap/Td (3 - Td or Tdap) 04/17/2028   HPV VACCINES  Aged Out   COVID-19 Vaccine  Discontinued    Discussed health benefits of physical activity, and encouraged him to engage in regular exercise appropriate for his age and condition.  Problem List Items Addressed This Visit       Cardiovascular and Mediastinum   Essential hypertension (Chronic)    BP Readings from Last 3 Encounters:  07/24/22 124/63  06/26/22 119/69  04/11/22 (!) 183/94  Blood pressure currently well-controlled on amlodipine 10 mg daily, lisinopril-hydrochlorothiazide 20-25 mg 1 tablet daily, metoprolol 200 mg daily Continue current medications DASH diet advised engage in regular moderate exercises at least 250 minutes weekly as tolerate.  medications were refilled today      Relevant Medications   amLODipine (NORVASC) 10 MG tablet   metoprolol (TOPROL-XL) 200 MG 24 hr tablet   lisinopril-hydrochlorothiazide (ZESTORETIC) 20-25 MG tablet     Other   Annual physical exam - Primary    Annual exam as documented.  Counseling done include healthy lifestyle involving committing to 150 minutes of exercise per week, heart healthy diet, and attaining healthy weight. The importance of adequate sleep also discussed.  Regular use of seat belt and home safety were also discussed . Changes in health habits are decided on by patient with goals and time frames set for achieving them. Immunization and cancer screening  needs are specifically addressed at this visit.   Patient declined flu vaccine was encouraged to consider getting the vaccine      Obesity (BMI 30.0-34.9)    Wt Readings from Last 3 Encounters:   07/24/22 258 lb 9.6 oz (117.3 kg)  11/21/21 243 lb 6.4 oz (110.4 kg)  11/08/21 244 lb (110.7 kg)   Body mass index is 34.12 kg/m.  Need to engage in regular moderate exercises at least 150 minutes weekly discussed. Patient counseled on low-carb modified diet      Marijuana smoker    Need to avoid smoking marijuana discussed      Pain, dental     - amoxicillin (AMOXIL) 875 MG tablet; Take 1 tablet (875 mg total) by mouth 2 (two) times daily for 10 days.  Dispense: 20 tablet; Refill: 0  He was encouraged to follow-up with dentist may need to have 2 broken tooth removed      Relevant Medications   amoxicillin (AMOXIL) 875 MG tablet   Dizziness    Intermittent dizziness, right arm numbness. Checking labs today will refer patient to neurology if symptoms persist      Other Visit Diagnoses     Screening for endocrine, nutritional, metabolic and immunity disorder       Relevant Orders   CBC with Differential   Lipid Panel   CMP14+EGFR   Hemoglobin A1c   TSH   Vitamin D, 25-hydroxy      Return in about 6 months (around 01/22/2023) for HTN.     Renee Rival, FNP

## 2022-07-24 NOTE — Patient Instructions (Addendum)
Pain, dental  - amoxicillin (AMOXIL) 875 MG tablet; Take 1 tablet (875 mg total) by mouth 2 (two) times daily for 10 days.  Dispense: 20 tablet; Refill: 0    It is important that you exercise regularly at least 30 minutes 5 times a week as tolerated  Think about what you will eat, plan ahead. Choose " clean, green, fresh or frozen" over canned, processed or packaged foods which are more sugary, salty and fatty. 70 to 75% of food eaten should be vegetables and fruit. Three meals at set times with snacks allowed between meals, but they must be fruit or vegetables. Aim to eat over a 12 hour period , example 7 am to 7 pm, and STOP after  your last meal of the day. Drink water,generally about 64 ounces per day, no other drink is as healthy. Fruit juice is best enjoyed in a healthy way, by EATING the fruit.  Thanks for choosing [facility] we consider it a privelige to serve you.

## 2022-07-24 NOTE — Assessment & Plan Note (Signed)
Intermittent dizziness, right arm numbness. Checking labs today will refer patient to neurology if symptoms persist

## 2022-07-24 NOTE — Assessment & Plan Note (Signed)
Annual exam as documented.  Counseling done include healthy lifestyle involving committing to 150 minutes of exercise per week, heart healthy diet, and attaining healthy weight. The importance of adequate sleep also discussed.  Regular use of seat belt and home safety were also discussed . Changes in health habits are decided on by patient with goals and time frames set for achieving them. Immunization and cancer screening  needs are specifically addressed at this visit.   Patient declined flu vaccine was encouraged to consider getting the vaccine

## 2022-07-24 NOTE — Assessment & Plan Note (Signed)
BP Readings from Last 3 Encounters:  07/24/22 124/63  06/26/22 119/69  04/11/22 (!) 183/94  Blood pressure currently well-controlled on amlodipine 10 mg daily, lisinopril-hydrochlorothiazide 20-25 mg 1 tablet daily, metoprolol 200 mg daily Continue current medications DASH diet advised engage in regular moderate exercises at least 250 minutes weekly as tolerate.  medications were refilled today

## 2022-07-25 LAB — HEMOGLOBIN A1C
Est. average glucose Bld gHb Est-mCnc: 134 mg/dL
Hgb A1c MFr Bld: 6.3 % — ABNORMAL HIGH (ref 4.8–5.6)

## 2022-07-25 LAB — CMP14+EGFR
ALT: 31 IU/L (ref 0–44)
AST: 24 IU/L (ref 0–40)
Albumin/Globulin Ratio: 1.7 (ref 1.2–2.2)
Albumin: 4.5 g/dL (ref 4.1–5.1)
Alkaline Phosphatase: 66 IU/L (ref 44–121)
BUN/Creatinine Ratio: 14 (ref 9–20)
BUN: 14 mg/dL (ref 6–24)
Bilirubin Total: 0.4 mg/dL (ref 0.0–1.2)
CO2: 21 mmol/L (ref 20–29)
Calcium: 9.6 mg/dL (ref 8.7–10.2)
Chloride: 101 mmol/L (ref 96–106)
Creatinine, Ser: 1.02 mg/dL (ref 0.76–1.27)
Globulin, Total: 2.6 g/dL (ref 1.5–4.5)
Glucose: 112 mg/dL — ABNORMAL HIGH (ref 70–99)
Potassium: 4.4 mmol/L (ref 3.5–5.2)
Sodium: 138 mmol/L (ref 134–144)
Total Protein: 7.1 g/dL (ref 6.0–8.5)
eGFR: 94 mL/min/{1.73_m2} (ref >59)

## 2022-07-25 LAB — LIPID PANEL
Chol/HDL Ratio: 4.3 ratio (ref 0.0–5.0)
Cholesterol, Total: 171 mg/dL (ref 100–199)
HDL: 40 mg/dL (ref >39)
LDL Chol Calc (NIH): 113 mg/dL — ABNORMAL HIGH (ref 0–99)
Triglycerides: 97 mg/dL (ref 0–149)
VLDL Cholesterol Cal: 18 mg/dL (ref 5–40)

## 2022-07-25 LAB — CBC WITH DIFFERENTIAL/PLATELET
Basophils Absolute: 0.1 10*3/uL (ref 0.0–0.2)
Basos: 1 %
EOS (ABSOLUTE): 0.2 10*3/uL (ref 0.0–0.4)
Eos: 3 %
Hematocrit: 43.8 % (ref 37.5–51.0)
Hemoglobin: 14.7 g/dL (ref 13.0–17.7)
Immature Grans (Abs): 0 10*3/uL (ref 0.0–0.1)
Immature Granulocytes: 0 %
Lymphocytes Absolute: 2.9 10*3/uL (ref 0.7–3.1)
Lymphs: 45 %
MCH: 29.8 pg (ref 26.6–33.0)
MCHC: 33.6 g/dL (ref 31.5–35.7)
MCV: 89 fL (ref 79–97)
Monocytes Absolute: 0.7 10*3/uL (ref 0.1–0.9)
Monocytes: 10 %
Neutrophils Absolute: 2.7 10*3/uL (ref 1.4–7.0)
Neutrophils: 41 %
Platelets: 316 10*3/uL (ref 150–450)
RBC: 4.93 x10E6/uL (ref 4.14–5.80)
RDW: 13.3 % (ref 11.6–15.4)
WBC: 6.5 10*3/uL (ref 3.4–10.8)

## 2022-07-25 LAB — TSH: TSH: 1.47 u[IU]/mL (ref 0.450–4.500)

## 2022-07-25 LAB — VITAMIN D 25 HYDROXY (VIT D DEFICIENCY, FRACTURES): Vit D, 25-Hydroxy: 15.3 ng/mL — ABNORMAL LOW (ref 30.0–100.0)

## 2022-07-26 ENCOUNTER — Other Ambulatory Visit: Payer: Self-pay | Admitting: Nurse Practitioner

## 2022-07-26 DIAGNOSIS — E559 Vitamin D deficiency, unspecified: Secondary | ICD-10-CM | POA: Insufficient documentation

## 2022-07-26 DIAGNOSIS — R7303 Prediabetes: Secondary | ICD-10-CM

## 2022-07-26 MED ORDER — VITAMIN D (ERGOCALCIFEROL) 1.25 MG (50000 UNIT) PO CAPS
50000.0000 [IU] | ORAL_CAPSULE | ORAL | 0 refills | Status: AC
Start: 2022-07-26 — End: ?

## 2022-12-10 ENCOUNTER — Encounter (HOSPITAL_COMMUNITY): Payer: Self-pay

## 2022-12-10 ENCOUNTER — Ambulatory Visit (HOSPITAL_COMMUNITY)
Admission: EM | Admit: 2022-12-10 | Discharge: 2022-12-10 | Disposition: A | Discharge status: Home / Self Care | Payer: PRIVATE HEALTH INSURANCE | Attending: Nurse Practitioner | Admitting: Nurse Practitioner

## 2022-12-10 DIAGNOSIS — I10 Essential (primary) hypertension: Secondary | ICD-10-CM | POA: Insufficient documentation

## 2022-12-10 DIAGNOSIS — S70361A Insect bite (nonvenomous), right thigh, initial encounter: Secondary | ICD-10-CM | POA: Insufficient documentation

## 2022-12-10 DIAGNOSIS — R109 Unspecified abdominal pain: Secondary | ICD-10-CM | POA: Insufficient documentation

## 2022-12-10 DIAGNOSIS — R112 Nausea with vomiting, unspecified: Secondary | ICD-10-CM | POA: Diagnosis not present

## 2022-12-10 DIAGNOSIS — W57XXXA Bitten or stung by nonvenomous insect and other nonvenomous arthropods, initial encounter: Secondary | ICD-10-CM | POA: Diagnosis not present

## 2022-12-10 DIAGNOSIS — R300 Dysuria: Secondary | ICD-10-CM | POA: Insufficient documentation

## 2022-12-10 DIAGNOSIS — R197 Diarrhea, unspecified: Secondary | ICD-10-CM | POA: Insufficient documentation

## 2022-12-10 LAB — COMPREHENSIVE METABOLIC PANEL
ALT: 33 U/L (ref 0–44)
AST: 27 U/L (ref 15–41)
Albumin: 4.1 g/dL (ref 3.5–5.0)
Alkaline Phosphatase: 58 U/L (ref 38–126)
Anion gap: 10 (ref 5–15)
BUN: 10 mg/dL (ref 6–20)
CO2: 26 mmol/L (ref 22–32)
Calcium: 9.6 mg/dL (ref 8.9–10.3)
Chloride: 103 mmol/L (ref 98–111)
Creatinine, Ser: 1.17 mg/dL (ref 0.61–1.24)
GFR, Estimated: >60 mL/min (ref >60)
Glucose, Bld: 107 mg/dL — ABNORMAL HIGH (ref 70–99)
Potassium: 4.2 mmol/L (ref 3.5–5.1)
Sodium: 139 mmol/L (ref 135–145)
Total Bilirubin: 0.8 mg/dL (ref 0.3–1.2)
Total Protein: 7.8 g/dL (ref 6.5–8.1)

## 2022-12-10 LAB — CBC WITH DIFFERENTIAL/PLATELET
Abs Immature Granulocytes: 0.01 10*3/uL (ref 0.00–0.07)
Basophils Absolute: 0.1 10*3/uL (ref 0.0–0.1)
Basophils Relative: 1 %
Eosinophils Absolute: 0.2 10*3/uL (ref 0.0–0.5)
Eosinophils Relative: 3 %
HCT: 46.8 % (ref 39.0–52.0)
Hemoglobin: 15.7 g/dL (ref 13.0–17.0)
Immature Granulocytes: 0 %
Lymphocytes Relative: 33 %
Lymphs Abs: 1.9 10*3/uL (ref 0.7–4.0)
MCH: 29.6 pg (ref 26.0–34.0)
MCHC: 33.5 g/dL (ref 30.0–36.0)
MCV: 88.1 fL (ref 80.0–100.0)
Monocytes Absolute: 0.6 10*3/uL (ref 0.1–1.0)
Monocytes Relative: 11 %
Neutro Abs: 3 10*3/uL (ref 1.7–7.7)
Neutrophils Relative %: 52 %
Platelets: 294 10*3/uL (ref 150–400)
RBC: 5.31 MIL/uL (ref 4.22–5.81)
RDW: 13.3 % (ref 11.5–15.5)
WBC: 5.8 10*3/uL (ref 4.0–10.5)
nRBC: 0 % (ref 0.0–0.2)

## 2022-12-10 LAB — POCT URINALYSIS DIP (MANUAL ENTRY)
Bilirubin, UA: NEGATIVE
Glucose, UA: NEGATIVE mg/dL
Ketones, POC UA: NEGATIVE mg/dL
Leukocytes, UA: NEGATIVE
Nitrite, UA: NEGATIVE
Spec Grav, UA: >=1.03 — AB (ref 1.010–1.025)
Urobilinogen, UA: 0.2 E.U./dL
pH, UA: 5.5 (ref 5.0–8.0)

## 2022-12-10 MED ORDER — ONDANSETRON HCL 4 MG PO TABS: 4.0000 mg | ORAL_TABLET | Freq: Four times a day (QID) | ORAL | 0 refills | Status: AC

## 2022-12-10 MED ORDER — DOXYCYCLINE HYCLATE 100 MG PO CAPS: 100.0000 mg | ORAL_CAPSULE | Freq: Two times a day (BID) | ORAL | 0 refills | Status: DC

## 2022-12-10 NOTE — ED Provider Notes (Signed)
[facility]    CSN: [id number] Arrival date & time: 12/10/22  1224      History   Chief Complaint Chief Complaint  Patient presents with   Abdominal Pain    HPI Derrick Clayton is a 42 y.o. male.   HPI  He is in today for evaluation of abdominal pain nausea vomiting diarrhea.  He reports that he has been having strong odor to his urine for 1 week.  He is concerned because his girlfriend has been diagnosed with a UTI.  He also reports that he was bitten by a tick to his right inner thigh a few weeks ago.  He has been feeling bad since then.  He denies any fever, chills, shortness of breath or chest pains.  He is also providing care for his father. He has a history of hypertension he reports that he is compliant with his antihypertensive medication.  He may have missed this morning's dose. 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 and open hemi-arch distal anastomosis    Patient Active Problem List   Diagnosis Date Noted   Prediabetes 07/26/2022   Vitamin D deficiency 07/26/2022   Annual physical exam 07/24/2022   Obesity (BMI 30.0-34.9) 07/24/2022   Marijuana smoker 07/24/2022   Pain, dental 07/24/2022   Dizziness 07/24/2022   COVID-19 virus infection 06/26/2022   Rash and other nonspecific skin eruption 11/08/2021   Positive autoantibody screening for celiac disease 11/08/2021   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   Low back pain 09/14/2021   Cutaneous abscess of face 08/22/2021   Thoracic aortic aneurysm without rupture (HCC) 09/25/2020   History of aortic dissection 07/02/2018   History of paroxysmal atrial tachycardia 07/02/2018   S/P repair ascending aortic dissection 03/11/2018   Uncontrolled hypertension 03/10/2018   TOBACCO  DEPENDENCE 07/24/2006   Essential hypertension 07/24/2006    Past Surgical History:  Procedure Laterality Date   AORTIC INTERVENTION N/A 03/10/2018   Procedure: RESUSPENSION OF THE NATIVE AORTIC VALVE;  Surgeon: Purcell Nails, MD;  Location: [facility] OR;  Service: Open Heart Surgery;  Laterality: N/A;   AORTIC VALVE REPAIR N/A 03/10/2018   Procedure: REPAIR OF ASCENDING AORTIC DISECTION USING STRAIGHT HEMASHIELD PLATINUM VASCULAR GRAFT; OPEN HEMIARCH DISTAL ANASTOMOSIS;  Surgeon: Purcell Nails, MD;  Location: [facility] OR;  Service: Open Heart Surgery;  Laterality: N/A;   FOOT SURGERY         Home Medications    Prior to Admission medications   Medication Sig Start Date End Date Taking? Authorizing Provider  amLODipine (NORVASC) 10 MG tablet Take 1 tablet (10 mg total) by mouth daily. 07/24/22  Yes Paseda, Baird Kay, FNP  doxycycline (VIBRAMYCIN) 100 MG capsule Take 1 capsule (100 mg total) by mouth 2 (two) times daily. 12/10/22  Yes Barbette Merino, NP  lisinopril-hydrochlorothiazide (ZESTORETIC) 20-25 MG tablet Take 1 tablet by mouth daily. 07/24/22  Yes Paseda, Baird Kay, FNP  metoprolol (TOPROL-XL) 200 MG 24 hr tablet TAKE 1 TABLET BY MOUTH ONCE DAILY WITH MEALS OR IMMEDIATELY FOLLOWING A MEAL 07/24/22  Yes Paseda, Folashade R, FNP  ondansetron (ZOFRAN) 4 MG tablet Take 1 tablet (4 mg total) by mouth every 6 (six) hours. 12/10/22  Yes Barbette Merino, NP  Vitamin D, Ergocalciferol, (DRISDOL) 1.25 MG (50000  UNIT) CAPS capsule Take 1 capsule (50,000 Units total) by mouth every 7 (seven) days. 07/26/22   Donell Beers, FNP    Family History Family History  Problem Relation Age of Onset   Cancer Mother    Heart failure Father    Asthma Son    Allergic rhinitis Neg Hx    Eczema Neg Hx    Urticaria Neg Hx     Social History Social History   Tobacco Use   Smoking status: Former    Current packs/day: 0.50    Types: Cigarettes   Smokeless tobacco: Never  Vaping Use    Vaping status: Never Used  Substance Use Topics   Alcohol use: Yes    Comment: 3-4x a week   Drug use: Yes    Frequency: 3.0 times per week    Types: Marijuana     Allergies   Patient has no known allergies.   Review of Systems Review of Systems   Physical Exam Triage Vital Signs ED Triage Vitals  Encounter Vitals Group     BP 12/10/22 1311 (!) 171/100     Systolic BP Percentile --      Diastolic BP Percentile --      Pulse Rate 12/10/22 1311 91     Resp 12/10/22 1311 16     Temp 12/10/22 1311 98.2 F (36.8 C)     Temp Source 12/10/22 1311 Oral     SpO2 12/10/22 1311 97 %     Weight 12/10/22 1310 250 lb (113.4 kg)     Height 12/10/22 1310 6\' 1"  (1.854 m)     Head Circumference --      Peak Flow --      Pain Score 12/10/22 1308 9     Pain Loc --      Pain Education --      Exclude from Growth Chart --    No data found.  Updated Vital Signs BP (!) 169/96 (BP Location: Right Arm)   Pulse 86   Temp 98.2 F (36.8 C) (Oral)   Resp 16   Ht 6\' 1"  (1.854 m)   Wt 250 lb (113.4 kg)   SpO2 96%   BMI 32.98 kg/m   Visual Acuity Right Eye Distance:   Left Eye Distance:   Bilateral Distance:    Right Eye Near:   Left Eye Near:    Bilateral Near:     Physical Exam Constitutional:      Appearance: He is ill-appearing.  HENT:     Head: Normocephalic and atraumatic.  Cardiovascular:     Rate and Rhythm: Normal rate.  Pulmonary:     Effort: Pulmonary effort is normal.  Skin:    General: Skin is warm and dry.     Capillary Refill: Capillary refill takes less than 2 seconds.  Neurological:     Mental Status: He is alert.  Psychiatric:        Mood and Affect: Mood normal.        Behavior: Behavior normal.      UC Treatments / Results  Labs (all labs ordered are listed, but only abnormal results are displayed) Labs Reviewed  POCT URINALYSIS DIP (MANUAL ENTRY) - Abnormal; Notable for the following components:      Result Value   Spec Grav, UA >=1.030  (*)    Blood, UA trace-intact (*)    Protein Ur, POC trace (*)    All other components within normal limits  CBC WITH DIFFERENTIAL/PLATELET  COMPREHENSIVE METABOLIC PANEL  LYME DISEASE SEROLOGY W/REFLEX  CYTOLOGY, (ORAL, ANAL, URETHRAL) ANCILLARY ONLY    EKG   Radiology No results found.  Procedures Procedures (including critical care time)  Medications Ordered in UC Medications - No data to display  Initial Impression / Assessment and Plan / UC Course  I have reviewed the triage vital signs and the nursing notes.  Pertinent labs & imaging results that were available during my care of the patient were reviewed by me and considered in my medical decision making (see chart for details).   Dysuria Final Clinical Impressions(s) / UC Diagnoses   Final diagnoses:  Nausea vomiting and diarrhea  Tick bite of right thigh, initial encounter     Discharge Instructions      Your urinalysis was negative for bacteria.  Your lab results are also pending. You have been evaluated for sexually transmitted infections. The results will result in your MyChart. However a nurse will follow up with you for any positive results for further treatment recommendations.  If your symptoms persist or get worse please follow up here for further evaluation       ED Prescriptions     Medication Sig Dispense Auth. Provider   doxycycline (VIBRAMYCIN) 100 MG capsule Take 1 capsule (100 mg total) by mouth 2 (two) times daily. 20 capsule Thad Ranger M, NP   ondansetron (ZOFRAN) 4 MG tablet Take 1 tablet (4 mg total) by mouth every 6 (six) hours. 12 tablet Barbette Merino, NP      PDMP not reviewed this encounter.   Thad Ranger [place], [place] 12/10/22 [phone]

## 2022-12-10 NOTE — Discharge Instructions (Addendum)
Your urinalysis was negative for bacteria.  Your lab results are also pending. You have been evaluated for sexually transmitted infections. The results will result in your MyChart. However a nurse will follow up with you for any positive results for further treatment recommendations.  If your symptoms persist or get worse please follow up here for further evaluation

## 2022-12-10 NOTE — ED Triage Notes (Signed)
Patient here today with c/o abd pain, nausea, diarrhea, loss of appetite, and odor in urine for over a week now. He has taken something for nausea with only temporary relief. His girlfriend had a UTI about 2 weeks ago. He also states that he was bit by a spider 2 weeks ago on the inner right thigh.

## 2022-12-11 LAB — CYTOLOGY, (ORAL, ANAL, URETHRAL) ANCILLARY ONLY
Chlamydia: NEGATIVE
Comment: NEGATIVE
Comment: NEGATIVE
Comment: NORMAL
Neisseria Gonorrhea: NEGATIVE
Trichomonas: NEGATIVE

## 2022-12-11 LAB — LYME DISEASE SEROLOGY W/REFLEX: Lyme Total Antibody EIA: NEGATIVE

## 2023-01-22 ENCOUNTER — Ambulatory Visit (INDEPENDENT_AMBULATORY_CARE_PROVIDER_SITE_OTHER): Payer: No Typology Code available for payment source | Admitting: Nurse Practitioner

## 2023-01-22 ENCOUNTER — Encounter: Payer: Self-pay | Admitting: Nurse Practitioner

## 2023-01-22 VITALS — BP 139/71 | HR 79 | Resp 16 | Wt 255.0 lb

## 2023-01-22 DIAGNOSIS — E669 Obesity, unspecified: Secondary | ICD-10-CM

## 2023-01-22 DIAGNOSIS — R7303 Prediabetes: Secondary | ICD-10-CM

## 2023-01-22 DIAGNOSIS — Z021 Encounter for pre-employment examination: Secondary | ICD-10-CM | POA: Diagnosis not present

## 2023-01-22 DIAGNOSIS — I10 Essential (primary) hypertension: Secondary | ICD-10-CM

## 2023-01-22 MED ORDER — METOPROLOL SUCCINATE ER 200 MG PO TB24
ORAL_TABLET | ORAL | 2 refills | Status: AC
Start: 2023-01-22 — End: ?

## 2023-01-22 MED ORDER — AMLODIPINE BESYLATE 10 MG PO TABS
10.0000 mg | ORAL_TABLET | Freq: Every day | ORAL | 1 refills | Status: DC
Start: 2023-01-22 — End: 2023-08-19

## 2023-01-22 MED ORDER — LISINOPRIL-HYDROCHLOROTHIAZIDE 20-25 MG PO TABS
1.0000 | ORAL_TABLET | Freq: Every day | ORAL | 3 refills | Status: DC
Start: 2023-01-22 — End: 2024-01-27

## 2023-01-22 NOTE — Patient Instructions (Addendum)
Please bring all your medications with you to your next appointment   BP goal is less than 130/80   1. Essential hypertension  - amLODipine (NORVASC) 10 MG tablet; Take 1 tablet (10 mg total) by mouth daily.  Dispense: 90 tablet; Refill: 1 - lisinopril-hydrochlorothiazide (ZESTORETIC) 20-25 MG tablet; Take 1 tablet by mouth daily.  Dispense: 90 tablet; Refill: 3 - metoprolol (TOPROL-XL) 200 MG 24 hr tablet; TAKE 1 TABLET BY MOUTH ONCE DAILY WITH MEALS OR IMMEDIATELY FOLLOWING A MEAL  Dispense: 90 tablet; Refill: 2 - Recheck vitals  2. Prediabetes   3. Obesity (BMI 30.0-34.9)   4. School health examination  - Measles/Mumps/Rubella Immunity - Hepatitis B surface antibody,quantitative - Hepatitis B core antibody, IgM    It is important that you exercise regularly at least 30 minutes 5 times a week as tolerated  Think about what you will eat, plan ahead. Choose " clean, green, fresh or frozen" over canned, processed or packaged foods which are more sugary, salty and fatty. 70 to 75% of food eaten should be vegetables and fruit. Three meals at set times with snacks allowed between meals, but they must be fruit or vegetables. Aim to eat over a 12 hour period , example 7 am to 7 pm, and STOP after  your last meal of the day. Drink water,generally about 64 ounces per day, no other drink is as healthy. Fruit juice is best enjoyed in a healthy way, by EATING the fruit.  [facility] for [facility] we consider it a privelige to serve you.

## 2023-01-22 NOTE — Progress Notes (Signed)
Established Patient Office Visit  Subjective:  Patient ID: Derrick Clayton, male    DOB: [birth year]  Age: 42 y.o. MRN: [id number]  CC:  Chief Complaint  Patient presents with   Hypertension    HPI Derrick Clayton is a 42 y.o. male  has a past medical history of Acute thoracic aortic dissection (HCC) (03/10/2018), COVID (04/2020), Hypertension, and S/P aortic dissection repair (03/11/2018).   Patient presents for follow-up for his chronic medical conditions  Hypertension.  Currently on amlodipine 10 mg daily, metoprolol 200 mg daily, lisinopril-added greater than 20-25 mg 1 tablet daily.  Patient denies chest pain, dizziness, edema.  He has not been checking his blood pressure at home, and does not exercise.   Starting a new job soon and has an interview today , needs forms for physical completed.  Also needs TB skin test, hepatitis B and MMR titers.   Patient denies any adverse reaction to his current medications     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 and open hemi-arch distal anastomosis    Past Surgical History:  Procedure Laterality Date   AORTIC INTERVENTION N/A 03/10/2018   Procedure: RESUSPENSION OF THE NATIVE AORTIC VALVE;  Surgeon: Purcell Nails, MD;  Location: [facility] OR;  Service: Open Heart Surgery;  Laterality: N/A;   AORTIC VALVE REPAIR N/A 03/10/2018   Procedure: REPAIR OF ASCENDING AORTIC DISECTION USING STRAIGHT HEMASHIELD PLATINUM VASCULAR GRAFT; OPEN HEMIARCH DISTAL ANASTOMOSIS;  Surgeon: Purcell Nails, MD;  Location: [facility] OR;  Service: Open Heart Surgery;  Laterality: N/A;   FOOT SURGERY      Family History  Problem Relation Age of Onset   Cancer Mother    Heart failure Father    Asthma Son    Allergic rhinitis Neg Hx    Eczema  Neg Hx    Urticaria Neg Hx     Social History   Socioeconomic History   Marital status: Single    Spouse name: Not on file   Number of children: 3   Years of education: Not on file   Highest education level: Not on file  Occupational History   Not on file  Tobacco Use   Smoking status: Former    Current packs/day: 0.50    Types: Cigarettes    Passive exposure: Past   Smokeless tobacco: Never  Vaping Use   Vaping status: Never Used  Substance and Sexual Activity   Alcohol use: Yes    Comment: 3-4x a week   Drug use: Yes    Frequency: 3.0 times per week    Types: Marijuana   Sexual activity: Yes    Birth control/protection: Condom  Other Topics Concern   Not on file  Social History Narrative   Lives with his dad   Social Determinants of Health   Financial Resource Strain: Not on file  Food Insecurity: Not on file  Transportation Needs: Not on file  Physical Activity: Not on file  Stress: Not on file  Social Connections: Not on file  Intimate Partner Violence: Not on file    Outpatient Medications Prior to Visit  Medication Sig Dispense Refill   Vitamin D, Ergocalciferol, (DRISDOL) 1.25 MG (50000 UNIT) CAPS capsule Take 1 capsule (50,000 Units total) by mouth every 7 (seven) days. (Patient  not taking: Reported on 01/22/2023) 8 capsule 0   amLODipine (NORVASC) 10 MG tablet Take 1 tablet (10 mg total) by mouth daily. 90 tablet 0   lisinopril-hydrochlorothiazide (ZESTORETIC) 20-25 MG tablet Take 1 tablet by mouth daily. 90 tablet 3   metoprolol (TOPROL-XL) 200 MG 24 hr tablet TAKE 1 TABLET BY MOUTH ONCE DAILY WITH MEALS OR IMMEDIATELY FOLLOWING A MEAL 90 tablet 1   ondansetron (ZOFRAN) 4 MG tablet Take 1 tablet (4 mg total) by mouth every 6 (six) hours. (Patient not taking: Reported on 01/22/2023) 12 tablet 0   doxycycline (VIBRAMYCIN) 100 MG capsule Take 1 capsule (100 mg total) by mouth 2 (two) times daily. (Patient not taking: Reported on 01/22/2023) 20 capsule 0   No  facility-administered medications prior to visit.    No Known Allergies  ROS Review of Systems  Constitutional:  Negative for activity change, appetite change, chills, diaphoresis, fatigue, fever and unexpected weight change.  HENT:  Negative for congestion, dental problem, drooling and ear discharge.   Eyes:  Negative for pain, discharge, redness and itching.  Respiratory:  Negative for apnea, cough, choking, chest tightness, shortness of breath and wheezing.   Cardiovascular: Negative.  Negative for chest pain, palpitations and leg swelling.  Gastrointestinal:  Negative for abdominal distention, abdominal pain, anal bleeding, blood in stool, constipation, diarrhea and vomiting.  Endocrine: Negative for polydipsia, polyphagia and polyuria.  Genitourinary:  Negative for difficulty urinating, flank pain, frequency and genital sores.  Musculoskeletal: Negative.  Negative for arthralgias, back pain, gait problem and joint swelling.  Skin:  Negative for color change, pallor and rash.  Neurological:  Negative for dizziness, facial asymmetry, light-headedness, numbness and headaches.  Psychiatric/Behavioral:  Negative for agitation, behavioral problems, confusion, hallucinations, self-injury, sleep disturbance and suicidal ideas.       Objective:    Physical Exam Vitals and nursing note reviewed.  Constitutional:      General: He is not in acute distress.    Appearance: Normal appearance. He is obese. He is not ill-appearing, toxic-appearing or diaphoretic.  HENT:     Mouth/Throat:     Mouth: Mucous membranes are moist.     Pharynx: Oropharynx is clear. No oropharyngeal exudate or posterior oropharyngeal erythema.  Eyes:     General: No scleral icterus.       Right eye: No discharge.        Left eye: No discharge.     Extraocular Movements: Extraocular movements intact.     Conjunctiva/sclera: Conjunctivae normal.  Cardiovascular:     Rate and Rhythm: Normal rate and regular rhythm.      Pulses: Normal pulses.     Heart sounds: Normal heart sounds. No murmur heard.    No friction rub. No gallop.  Pulmonary:     Effort: Pulmonary effort is normal. No respiratory distress.     Breath sounds: Normal breath sounds. No stridor. No wheezing, rhonchi or rales.  Chest:     Chest wall: No tenderness.  Abdominal:     General: There is no distension.     Palpations: Abdomen is soft.     Tenderness: There is no abdominal tenderness. There is no right CVA tenderness, left CVA tenderness or guarding.  Musculoskeletal:        General: No swelling, tenderness, deformity or signs of injury.     Right lower leg: No edema.     Left lower leg: No edema.  Skin:    General: Skin is warm and dry.  Capillary Refill: Capillary refill takes less than 2 seconds.     Coloration: Skin is not jaundiced or pale.     Findings: No bruising, erythema or lesion.  Neurological:     Mental Status: He is alert and oriented to person, place, and time.     Motor: No weakness.     Coordination: Coordination normal.     Gait: Gait normal.  Psychiatric:        Mood and Affect: Mood normal.        Behavior: Behavior normal.        Thought Content: Thought content normal.        Judgment: Judgment normal.     BP 139/71   Pulse 79   Resp 16   Wt 255 lb (115.7 kg)   SpO2 98%   BMI 33.64 kg/m  Wt Readings from Last 3 Encounters:  01/22/23 255 lb (115.7 kg)  12/10/22 250 lb (113.4 kg)  07/24/22 258 lb 9.6 oz (117.3 kg)    Lab Results  Component Value Date   TSH 1.470 07/24/2022   Lab Results  Component Value Date   WBC 5.8 12/10/2022   HGB 15.7 12/10/2022   HCT 46.8 12/10/2022   MCV 88.1 12/10/2022   PLT 294 12/10/2022   Lab Results  Component Value Date   NA 139 12/10/2022   K 4.2 12/10/2022   CO2 26 12/10/2022   GLUCOSE 107 (H) 12/10/2022   BUN 10 12/10/2022   CREATININE 1.17 12/10/2022   BILITOT 0.8 12/10/2022   ALKPHOS 58 12/10/2022   AST 27 12/10/2022   ALT 33  12/10/2022   PROT 7.8 12/10/2022   ALBUMIN 4.1 12/10/2022   CALCIUM 9.6 12/10/2022   ANIONGAP 10 12/10/2022   EGFR 94 07/24/2022   Lab Results  Component Value Date   CHOL 171 07/24/2022   Lab Results  Component Value Date   HDL 40 07/24/2022   Lab Results  Component Value Date   LDLCALC 113 (H) 07/24/2022   Lab Results  Component Value Date   TRIG 97 07/24/2022   Lab Results  Component Value Date   CHOLHDL 4.3 07/24/2022   Lab Results  Component Value Date   HGBA1C 6.3 (H) 07/24/2022      Assessment & Plan:   Problem List Items Addressed This Visit       Cardiovascular and Mediastinum   Essential hypertension (Chronic)    BP Readings from Last 3 Encounters:  01/22/23 139/71  12/10/22 (!) 169/96  07/24/22 124/63   HTN fairly controlled on amlodipine 10 mg daily, lisinopril-hydrochlorothiazide 20-25 mg 1 tablet daily, metoprolol 200 mg daily. Patient encouraged to take all medications daily as ordered Blood pressure goal is less than 130/80, encouraged to monitor blood pressure at home reports blood pressure readings consistently greater than 130/80. Continue current medications. No changes in management. Discussed DASH diet and dietary sodium restrictions Continue to increase dietary efforts and exercise.  Follow-up in 2 months        Relevant Medications   amLODipine (NORVASC) 10 MG tablet   lisinopril-hydrochlorothiazide (ZESTORETIC) 20-25 MG tablet   metoprolol (TOPROL-XL) 200 MG 24 hr tablet     Other   Obesity (BMI 30.0-34.9)    Wt Readings from Last 3 Encounters:  01/22/23 255 lb (115.7 kg)  12/10/22 250 lb (113.4 kg)  07/24/22 258 lb 9.6 oz (117.3 kg)   Body mass index is 33.64 kg/m.  Patient counseled on low-carb modified diet Encouraged to engage in  regular moderate to vigorous exercises at least 150 minutes weekly as tolerated       Prediabetes - Primary    Lab Results  Component Value Date   HGBA1C 6.3 (H) 07/24/2022  Patient  counseled on low-carb modified diet      Encounter for pre-employment examination    tuberculin PPD solution not available in the office.  Patient will return for TB skin test  - Measles/Mumps/Rubella Immunity - Hepatitis B surface antibody,quantitative - Hepatitis B core antibody, IgM       Relevant Orders   Measles/Mumps/Rubella Immunity   Hepatitis B surface antibody,quantitative   Hepatitis B core antibody, IgM    Meds ordered this encounter  Medications   amLODipine (NORVASC) 10 MG tablet    Sig: Take 1 tablet (10 mg total) by mouth daily.    Dispense:  90 tablet    Refill:  1   lisinopril-hydrochlorothiazide (ZESTORETIC) 20-25 MG tablet    Sig: Take 1 tablet by mouth daily.    Dispense:  90 tablet    Refill:  3    Replaces individual HCTZ and lisinopril   metoprolol (TOPROL-XL) 200 MG 24 hr tablet    Sig: TAKE 1 TABLET BY MOUTH ONCE DAILY WITH MEALS OR IMMEDIATELY FOLLOWING A MEAL    Dispense:  90 tablet    Refill:  2    Follow-up: Return in about 2 months (around 03/24/2023) for HTN.    Donell Beers, FNP

## 2023-01-22 NOTE — Assessment & Plan Note (Signed)
tuberculin PPD solution not available in the office.  Patient will return for TB skin test  - Measles/Mumps/Rubella Immunity - Hepatitis B surface antibody,quantitative - Hepatitis B core antibody, IgM

## 2023-01-22 NOTE — Progress Notes (Signed)
.  Pt presents for hypertension f/u   -needs Vit D refill -request TB skin test  -needs form completed for employment-physical completed 07/24/2022

## 2023-01-22 NOTE — Assessment & Plan Note (Signed)
Lab Results  Component Value Date   HGBA1C 6.3 (H) 07/24/2022  Patient counseled on low-carb modified diet

## 2023-01-22 NOTE — Assessment & Plan Note (Signed)
BP Readings from Last 3 Encounters:  01/22/23 139/71  12/10/22 (!) 169/96  07/24/22 124/63   HTN fairly controlled on amlodipine 10 mg daily, lisinopril-hydrochlorothiazide 20-25 mg 1 tablet daily, metoprolol 200 mg daily. Patient encouraged to take all medications daily as ordered Blood pressure goal is less than 130/80, encouraged to monitor blood pressure at home reports blood pressure readings consistently greater than 130/80. Continue current medications. No changes in management. Discussed DASH diet and dietary sodium restrictions Continue to increase dietary efforts and exercise.  Follow-up in 2 months

## 2023-01-22 NOTE — Assessment & Plan Note (Signed)
Wt Readings from Last 3 Encounters:  01/22/23 255 lb (115.7 kg)  12/10/22 250 lb (113.4 kg)  07/24/22 258 lb 9.6 oz (117.3 kg)   Body mass index is 33.64 kg/m.  Patient counseled on low-carb modified diet Encouraged to engage in regular moderate to vigorous exercises at least 150 minutes weekly as tolerated

## 2023-01-23 LAB — MEASLES/MUMPS/RUBELLA IMMUNITY
MUMPS ABS, IGG: <9 [AU]/ml — ABNORMAL LOW (ref >10.9)
RUBEOLA AB, IGG: >300 [AU]/ml (ref >16.4)
Rubella Antibodies, IGG: 2.93 {index} (ref >0.99)

## 2023-01-23 LAB — HEPATITIS B CORE ANTIBODY, IGM: Hep B C IgM: NEGATIVE

## 2023-01-23 LAB — HEPATITIS B SURFACE ANTIBODY, QUANTITATIVE: Hepatitis B Surf Ab Quant: <3.5 m[IU]/mL — ABNORMAL LOW

## 2023-02-03 ENCOUNTER — Encounter: Payer: Self-pay | Admitting: *Deleted

## 2023-02-26 ENCOUNTER — Other Ambulatory Visit: Payer: Self-pay

## 2023-02-26 ENCOUNTER — Other Ambulatory Visit: Payer: No Typology Code available for payment source

## 2023-02-26 DIAGNOSIS — Z111 Encounter for screening for respiratory tuberculosis: Secondary | ICD-10-CM

## 2023-03-02 LAB — QUANTIFERON-TB GOLD PLUS
QuantiFERON Nil Value: 0.01 [IU]/mL
QuantiFERON TB1 Ag Value: 0.01 [IU]/mL
QuantiFERON TB2 Ag Value: 0.01 [IU]/mL

## 2023-03-24 ENCOUNTER — Encounter (HOSPITAL_COMMUNITY): Payer: Self-pay

## 2023-03-24 ENCOUNTER — Ambulatory Visit (HOSPITAL_COMMUNITY)
Admission: EM | Admit: 2023-03-24 | Discharge: 2023-03-24 | Disposition: A | Discharge status: Home / Self Care | Payer: No Typology Code available for payment source | Attending: Internal Medicine | Admitting: Internal Medicine

## 2023-03-24 DIAGNOSIS — H6122 Impacted cerumen, left ear: Secondary | ICD-10-CM

## 2023-03-24 NOTE — ED Provider Notes (Signed)
[facility]    CSN: [id number] Arrival date & time: 03/24/23  4098      History   Chief Complaint Chief Complaint  Patient presents with   Cerumen Impaction    HPI Kylil Chiari is a 42 y.o. male.   The history is provided by the patient.   Ear issue, states had some mild discomfort then used peroxide and a Q-tip in the left ear now has decreased hearing on that side.  Ear feels blocked.  Denies rhinorrhea, nasal congestion, sore throat, cough or drainage from ear.  Denies history of similar symptoms in the past.  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 and open hemi-arch distal anastomosis    Patient Active Problem List   Diagnosis Date Noted   Encounter for pre-employment examination 01/22/2023   Prediabetes 07/26/2022   Vitamin D deficiency 07/26/2022   Annual physical exam 07/24/2022   Obesity (BMI 30.0-34.9) 07/24/2022   Marijuana smoker 07/24/2022   Pain, dental 07/24/2022   Dizziness 07/24/2022   COVID-19 virus infection 06/26/2022   Rash and other nonspecific skin eruption 11/08/2021   Positive autoantibody screening for celiac disease 11/08/2021   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   Low back pain 09/14/2021   Cutaneous abscess of face 08/22/2021   Thoracic aortic aneurysm without rupture (HCC) 09/25/2020   History of aortic dissection 07/02/2018   History of paroxysmal atrial tachycardia 07/02/2018   S/P repair ascending aortic dissection 03/11/2018   Uncontrolled hypertension 03/10/2018   TOBACCO DEPENDENCE 07/24/2006   Essential hypertension 07/24/2006    Past Surgical History:  Procedure Laterality Date   AORTIC INTERVENTION N/A 03/10/2018   Procedure: RESUSPENSION OF THE NATIVE AORTIC VALVE;  Surgeon: Purcell Nails, MD;  Location: [facility] OR;  Service: Open Heart Surgery;  Laterality: N/A;   AORTIC VALVE REPAIR N/A 03/10/2018   Procedure: REPAIR OF ASCENDING AORTIC DISECTION USING STRAIGHT HEMASHIELD PLATINUM VASCULAR GRAFT; OPEN HEMIARCH DISTAL ANASTOMOSIS;  Surgeon: Purcell Nails, MD;  Location: [facility] OR;  Service: Open Heart Surgery;  Laterality: N/A;   FOOT SURGERY         Home Medications    Prior to Admission medications   Medication Sig Start Date End Date Taking? Authorizing Provider  amLODipine (NORVASC) 10 MG tablet Take 1 tablet (10 mg total) by mouth daily. 01/22/23  Yes Paseda, Baird Kay, FNP  lisinopril-hydrochlorothiazide (ZESTORETIC) 20-25 MG tablet Take 1 tablet by mouth daily. 01/22/23  Yes Paseda, Baird Kay, FNP  metoprolol (TOPROL-XL) 200 MG 24 hr tablet TAKE 1 TABLET BY MOUTH ONCE DAILY WITH MEALS OR IMMEDIATELY FOLLOWING A MEAL 01/22/23  Yes Paseda, Baird Kay, FNP  Vitamin D, Ergocalciferol, (DRISDOL) 1.25 MG (50000 UNIT) CAPS capsule Take 1 capsule (50,000 Units total) by mouth every 7 (seven) days. 07/26/22  Yes Paseda, Baird Kay, FNP  ondansetron (ZOFRAN) 4 MG tablet Take 1 tablet (4 mg total) by mouth every 6 (six) hours. Patient not taking: Reported on 01/22/2023 12/10/22   Barbette Merino, NP    Family History Family History  Problem Relation Age of Onset   Cancer Mother    Heart failure Father    Asthma Son    Allergic rhinitis Neg Hx    Eczema Neg Hx    Urticaria Neg Hx  Social History Social History   Tobacco Use   Smoking status: Former    Current packs/day: 0.50    Types: Cigarettes    Passive exposure: Past   Smokeless tobacco: Never  Vaping Use   Vaping status: Never Used  Substance Use Topics   Alcohol use: Yes    Comment: 3-4x a week   Drug use: Yes    Frequency: 3.0 times per week    Types: Marijuana     Allergies   Patient has no known allergies.   Review of Systems Review of Systems  HENT:  Positive for ear pain and  hearing loss. Negative for ear discharge, postnasal drip, rhinorrhea and sore throat.   Respiratory:  Negative for cough.      Physical Exam Triage Vital Signs ED Triage Vitals [03/24/23 1113]  Encounter Vitals Group     BP 116/73     Systolic BP Percentile      Diastolic BP Percentile      Pulse Rate 65     Resp 16     Temp 97.6 F (36.4 C)     Temp Source Oral     SpO2 96 %     Weight      Height      Head Circumference      Peak Flow      Pain Score 3     Pain Loc      Pain Education      Exclude from Growth Chart    No data found.  Updated Vital Signs BP 116/73 (BP Location: Left Arm)   Pulse 65   Temp 97.6 F (36.4 C) (Oral)   Resp 16   SpO2 96%   Visual Acuity Right Eye Distance:   Left Eye Distance:   Bilateral Distance:    Right Eye Near:   Left Eye Near:    Bilateral Near:     Physical Exam Vitals and nursing note reviewed.  Constitutional:      Appearance: He is not ill-appearing.  HENT:     Head: Normocephalic and atraumatic.     Right Ear: Tympanic membrane and ear canal normal.     Left Ear: There is impacted cerumen.     Mouth/Throat:     Pharynx: Oropharynx is clear.  Cardiovascular:     Rate and Rhythm: Normal rate.  Pulmonary:     Effort: Pulmonary effort is normal. No respiratory distress.  Skin:    General: Skin is warm.  Neurological:     Mental Status: He is alert and oriented to person, place, and time.      UC Treatments / Results  Labs (all labs ordered are listed, but only abnormal results are displayed) Labs Reviewed - No data to display  EKG   Radiology No results found.  Procedures Procedures (including critical care time)  Medications Ordered in UC Medications - No data to display  Initial Impression / Assessment and Plan / UC Course  I have reviewed the triage vital signs and the nursing notes.  Pertinent labs & imaging results that were available during my care of the patient were reviewed by me and  considered in my medical decision making (see chart for details).     42 year old male with left ear cerumen impaction and hearing loss, ear wash by staff, examined post procedure, canal clear TM intact no bleeding.  Patient states he can hear better. Final Clinical Impressions(s) / UC Diagnoses   Final diagnoses:  None  Discharge Instructions   None    ED Prescriptions   None    PDMP not reviewed this encounter.   Meliton Rattan, [place] 03/24/23 1145

## 2023-03-24 NOTE — ED Triage Notes (Signed)
Pt is here for left ear clogged and pain.

## 2023-03-26 ENCOUNTER — Ambulatory Visit: Payer: Self-pay | Admitting: Nurse Practitioner

## 2023-04-23 ENCOUNTER — Ambulatory Visit (INDEPENDENT_AMBULATORY_CARE_PROVIDER_SITE_OTHER): Payer: No Typology Code available for payment source | Admitting: Nurse Practitioner

## 2023-04-23 ENCOUNTER — Encounter: Payer: Self-pay | Admitting: Nurse Practitioner

## 2023-04-23 VITALS — BP 127/69 | HR 78 | Temp 97.0°F | Wt 255.2 lb

## 2023-04-23 DIAGNOSIS — R350 Frequency of micturition: Secondary | ICD-10-CM | POA: Diagnosis not present

## 2023-04-23 DIAGNOSIS — I10 Essential (primary) hypertension: Secondary | ICD-10-CM | POA: Diagnosis not present

## 2023-04-23 DIAGNOSIS — K0889 Other specified disorders of teeth and supporting structures: Secondary | ICD-10-CM | POA: Insufficient documentation

## 2023-04-23 DIAGNOSIS — R7303 Prediabetes: Secondary | ICD-10-CM

## 2023-04-23 LAB — POCT GLYCOSYLATED HEMOGLOBIN (HGB A1C): Hemoglobin A1C: 6.1 % — AB (ref 4.0–5.6)

## 2023-04-23 LAB — POCT URINALYSIS DIP (CLINITEK)
Bilirubin, UA: NEGATIVE
Glucose, UA: NEGATIVE mg/dL
Ketones, POC UA: NEGATIVE mg/dL
Leukocytes, UA: NEGATIVE
Nitrite, UA: NEGATIVE
POC PROTEIN,UA: NEGATIVE
Spec Grav, UA: 1.025 (ref 1.010–1.025)
Urobilinogen, UA: 0.2 U/dL
pH, UA: 5.5 (ref 5.0–8.0)

## 2023-04-23 NOTE — Assessment & Plan Note (Signed)
Lab Results  Component Value Date   COLORU yellow 04/23/2023   CLARITYU clear 04/23/2023   GLUCOSEUR negative 04/23/2023   BILIRUBINUR negative 04/23/2023   KETONESU neg 09/25/2020   SPECGRAV 1.025 04/23/2023   RBCUR trace-intact (A) 04/23/2023   PHUR 5.5 04/23/2023   PROTEINUR trace (A) 12/10/2022   UROBILINOGEN 0.2 04/23/2023   LEUKOCYTESUR Negative 04/23/2023  Negative for UTI Patient is on hydrochlorothiazide this could be causing his urinary frequency

## 2023-04-23 NOTE — Progress Notes (Signed)
Established Patient Office Visit  Subjective:  Patient ID: Derrick Clayton, male    DOB: 07/18/1980  Age: 42 y.o. MRN: [id number]  CC:  Chief Complaint  Patient presents with   Prediabetes    HPI Derrick Clayton is a 42 y.o. male  has a past medical history of Acute thoracic aortic dissection (HCC) (03/10/2018), COVID (04/2020), Hypertension, and S/P aortic dissection repair (03/11/2018).  Patient presents for follow-up for his chronic medical conditions  Hypertension.  Currently on amlodipine 10 mg daily, metoprolol 100 mg daily, lisinopril-hydrochlorothiazide 20-25 mg 1 tablet daily.  States that he walks 5 miles daily, does not follow a low-salt heart healthy diet.  Has a blood pressure monitor at home but this not check blood pressure regularly.  He denies shortness of breath chest pain edema  Has an upcoming appointment today with dentist for dental pain  Grief.  Patient stated that he recently lost his father, who was age 68.  The patient is now staying with his cousin he was previously living with his father.  Emotional support provided, patient refused grief counseling.  Patient complains of urinary frequency no complaints of dysuria abdominal pain nausea vomiting    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 and open hemi-arch distal anastomosis    Past Surgical History:  Procedure Laterality Date   AORTIC INTERVENTION N/A 03/10/2018   Procedure: RESUSPENSION OF THE NATIVE AORTIC VALVE;  Surgeon: Purcell Nails, MD;  Location: [facility] OR;  Service: Open Heart Surgery;  Laterality: N/A;   AORTIC VALVE REPAIR N/A 03/10/2018   Procedure: REPAIR OF ASCENDING AORTIC DISECTION USING STRAIGHT HEMASHIELD PLATINUM VASCULAR GRAFT; OPEN HEMIARCH DISTAL ANASTOMOSIS;   Surgeon: Purcell Nails, MD;  Location: [facility] OR;  Service: Open Heart Surgery;  Laterality: N/A;   FOOT SURGERY      Family History  Problem Relation Age of Onset   Cancer Mother    Heart failure Father    High blood pressure Father    Diabetes Father    Asthma Son    Allergic rhinitis Neg Hx    Eczema Neg Hx    Urticaria Neg Hx     Social History   Socioeconomic History   Marital status: Single    Spouse name: Not on file   Number of children: 3   Years of education: Not on file   Highest education level: Not on file  Occupational History   Not on file  Tobacco Use   Smoking status: Former    Current packs/day: 0.50    Types: Cigarettes    Passive exposure: Past   Smokeless tobacco: Never  Vaping Use   Vaping status: Never Used  Substance and Sexual Activity   Alcohol use: Yes    Comment: 3-4x a week   Drug use: Yes    Frequency: 3.0 times per week    Types: Marijuana   Sexual activity: Yes    Birth control/protection: Condom  Other Topics Concern   Not on file  Social History Narrative   Lives with his dad   Social Determinants of Health   Financial Resource Strain: Not on file  Food Insecurity: Not on file  Transportation Needs: Not on file  Physical Activity: Not on file  Stress: Not on file  Social Connections: Not on file  Intimate Partner Violence: Not on file    Outpatient Medications Prior to Visit  Medication Sig Dispense Refill   amLODipine (NORVASC) 10 MG tablet Take 1 tablet (10 mg total) by mouth daily. 90 tablet 1   lisinopril-hydrochlorothiazide (ZESTORETIC) 20-25 MG tablet Take 1 tablet by mouth daily. 90 tablet 3   metoprolol (TOPROL-XL) 200 MG 24 hr tablet TAKE 1 TABLET BY MOUTH ONCE DAILY WITH MEALS OR IMMEDIATELY FOLLOWING A MEAL 90 tablet 2   ondansetron (ZOFRAN) 4 MG tablet Take 1 tablet (4 mg total) by mouth every 6 (six) hours. (Patient not taking: Reported on 01/22/2023) 12 tablet 0   Vitamin D, Ergocalciferol, (DRISDOL) 1.25 MG  (50000 UNIT) CAPS capsule Take 1 capsule (50,000 Units total) by mouth every 7 (seven) days. (Patient not taking: Reported on 04/23/2023) 8 capsule 0   No facility-administered medications prior to visit.    No Known Allergies  ROS Review of Systems  Constitutional:  Negative for appetite change, chills, fatigue and fever.  HENT:  Positive for dental problem. Negative for congestion, postnasal drip, rhinorrhea and sneezing.   Respiratory:  Negative for cough, shortness of breath and wheezing.   Cardiovascular:  Negative for chest pain, palpitations and leg swelling.  Gastrointestinal:  Negative for abdominal pain, constipation, nausea and vomiting.  Genitourinary:  Positive for frequency. Negative for difficulty urinating, dysuria and flank pain.  Musculoskeletal:  Negative for arthralgias, back pain, joint swelling and myalgias.  Skin:  Negative for color change, pallor, rash and wound.  Neurological:  Negative for dizziness, facial asymmetry, weakness, numbness and headaches.  Psychiatric/Behavioral:  Negative for behavioral problems, confusion, self-injury and suicidal ideas.       Objective:    Physical Exam Vitals and nursing note reviewed.  Constitutional:      General: He is not in acute distress.    Appearance: Normal appearance. He is obese. He is not ill-appearing, toxic-appearing or diaphoretic.  HENT:     Mouth/Throat:     Mouth: Mucous membranes are moist.     Pharynx: Oropharynx is clear. No oropharyngeal exudate or posterior oropharyngeal erythema.  Eyes:     General: No scleral icterus.       Right eye: No discharge.        Left eye: No discharge.     Extraocular Movements: Extraocular movements intact.     Conjunctiva/sclera: Conjunctivae normal.  Cardiovascular:     Rate and Rhythm: Normal rate and regular rhythm.     Pulses: Normal pulses.     Heart sounds: Normal heart sounds. No murmur heard.    No friction rub. No gallop.  Pulmonary:     Effort:  Pulmonary effort is normal. No respiratory distress.     Breath sounds: Normal breath sounds. No stridor. No wheezing, rhonchi or rales.  Chest:     Chest wall: No tenderness.  Abdominal:     General: There is no distension.     Palpations: Abdomen is soft.     Tenderness: There is no abdominal tenderness. There is no right CVA tenderness, left CVA tenderness or guarding.  Musculoskeletal:        General: No swelling, tenderness, deformity or signs of injury.     Right lower leg: No edema.     Left lower leg: No edema.  Skin:    General: Skin is warm and dry.     Capillary Refill: Capillary refill takes less than 2 seconds.  Coloration: Skin is not jaundiced or pale.     Findings: No bruising, erythema or lesion.  Neurological:     Mental Status: He is alert and oriented to person, place, and time.     Motor: No weakness.     Coordination: Coordination normal.     Gait: Gait normal.  Psychiatric:        Mood and Affect: Mood normal.        Behavior: Behavior normal.        Thought Content: Thought content normal.        Judgment: Judgment normal.     BP 127/69   Pulse 78   Temp (!) 97 F (36.1 C)   Wt 255 lb 3.2 oz (115.8 kg)   SpO2 100%   BMI 33.67 kg/m  Wt Readings from Last 3 Encounters:  04/23/23 255 lb 3.2 oz (115.8 kg)  01/22/23 255 lb (115.7 kg)  12/10/22 250 lb (113.4 kg)    Lab Results  Component Value Date   TSH 1.470 07/24/2022   Lab Results  Component Value Date   WBC 5.8 12/10/2022   HGB 15.7 12/10/2022   HCT 46.8 12/10/2022   MCV 88.1 12/10/2022   PLT 294 12/10/2022   Lab Results  Component Value Date   NA 139 12/10/2022   K 4.2 12/10/2022   CO2 26 12/10/2022   GLUCOSE 107 (H) 12/10/2022   BUN 10 12/10/2022   CREATININE 1.17 12/10/2022   BILITOT 0.8 12/10/2022   ALKPHOS 58 12/10/2022   AST 27 12/10/2022   ALT 33 12/10/2022   PROT 7.8 12/10/2022   ALBUMIN 4.1 12/10/2022   CALCIUM 9.6 12/10/2022   ANIONGAP 10 12/10/2022   EGFR 94  07/24/2022   Lab Results  Component Value Date   CHOL 171 07/24/2022   Lab Results  Component Value Date   HDL 40 07/24/2022   Lab Results  Component Value Date   LDLCALC 113 (H) 07/24/2022   Lab Results  Component Value Date   TRIG 97 07/24/2022   Lab Results  Component Value Date   CHOLHDL 4.3 07/24/2022   Lab Results  Component Value Date   HGBA1C 6.1 (A) 04/23/2023      Assessment & Plan:   Problem List Items Addressed This Visit       Cardiovascular and Mediastinum   Essential hypertension (Chronic)    BP Readings from Last 3 Encounters:  04/23/23 127/69  03/24/23 116/73  01/22/23 139/71   HTN Controlled .  On amlodipine 10 mg daily, lisinopril-hydrochlorothiazide 20-25 mg 1 tablet daily, metoprolol 20 mg daily Continue current medications. No changes in management. Discussed DASH diet and dietary sodium restrictions Continue to increase dietary efforts and exercise.  BMP today Encouraged to follow-up with the cardiology        Relevant Orders   Basic metabolic panel     Other   Prediabetes    Lab Results  Component Value Date   HGBA1C 6.1 (A) 04/23/2023  Stable condition Avoid sugar sweets soda      Relevant Orders   POCT glycosylated hemoglobin (Hb A1C) (Completed)   Tooth pain    Has upcoming appointment with the dentist today      Urine frequency - Primary    Lab Results  Component Value Date   COLORU yellow 04/23/2023   CLARITYU clear 04/23/2023   GLUCOSEUR negative 04/23/2023   BILIRUBINUR negative 04/23/2023   KETONESU neg 09/25/2020   SPECGRAV 1.025 04/23/2023   RBCUR  trace-intact (A) 04/23/2023   PHUR 5.5 04/23/2023   PROTEINUR trace (A) 12/10/2022   UROBILINOGEN 0.2 04/23/2023   LEUKOCYTESUR Negative 04/23/2023  Negative for UTI Patient is on hydrochlorothiazide this could be causing his urinary frequency      Relevant Orders   POCT URINALYSIS DIP (CLINITEK) (Completed)    No orders of the defined types were  placed in this encounter.   Follow-up: Return in about 4 months (around 08/21/2023) for CPE.    Donell Beers, FNP

## 2023-04-23 NOTE — Assessment & Plan Note (Signed)
Has upcoming appointment with the dentist today

## 2023-04-23 NOTE — Assessment & Plan Note (Signed)
BP Readings from Last 3 Encounters:  04/23/23 127/69  03/24/23 116/73  01/22/23 139/71   HTN Controlled .  On amlodipine 10 mg daily, lisinopril-hydrochlorothiazide 20-25 mg 1 tablet daily, metoprolol 20 mg daily Continue current medications. No changes in management. Discussed DASH diet and dietary sodium restrictions Continue to increase dietary efforts and exercise.  BMP today Encouraged to follow-up with the cardiology

## 2023-04-23 NOTE — Patient Instructions (Addendum)
Cardiology office. [facility] [phone] please call them and schedule a follow-up visit.  Around 3 times per week, check your blood pressure 2 times per day. once in the morning and once in the evening. The readings should be at least one minute apart. Write down these values and bring them to your next nurse visit/appointment.  When you check your BP, make sure you have been doing something calm/relaxing 5 minutes prior to checking. Both feet should be flat on the floor and you should be sitting. Use your left arm and make sure it is in a relaxed position (on a table), and that the cuff is at the approximate level/height of your heart.     It is important that you exercise regularly at least 30 minutes 5 times a week as tolerated  Think about what you will eat, plan ahead. Choose " clean, green, fresh or frozen" over canned, processed or packaged foods which are more sugary, salty and fatty. 70 to 75% of food eaten should be vegetables and fruit. Three meals at set times with snacks allowed between meals, but they must be fruit or vegetables. Aim to eat over a 12 hour period , example 7 am to 7 pm, and STOP after  your last meal of the day. Drink water,generally about 64 ounces per day, no other drink is as healthy. Fruit juice is best enjoyed in a healthy way, by EATING the fruit.  Thanks for choosing [facility] we consider it a privelige to serve you.

## 2023-04-23 NOTE — Assessment & Plan Note (Signed)
Lab Results  Component Value Date   HGBA1C 6.1 (A) 04/23/2023  Stable condition Avoid sugar sweets soda

## 2023-05-26 ENCOUNTER — Other Ambulatory Visit: Payer: Self-pay

## 2023-05-26 ENCOUNTER — Encounter: Payer: Self-pay | Admitting: Emergency Medicine

## 2023-05-26 ENCOUNTER — Ambulatory Visit
Admission: EM | Admit: 2023-05-26 | Discharge: 2023-05-26 | Disposition: A | Discharge status: Home / Self Care | Payer: No Typology Code available for payment source

## 2023-05-26 ENCOUNTER — Ambulatory Visit (INDEPENDENT_AMBULATORY_CARE_PROVIDER_SITE_OTHER): Payer: No Typology Code available for payment source

## 2023-05-26 DIAGNOSIS — M79671 Pain in right foot: Secondary | ICD-10-CM | POA: Diagnosis not present

## 2023-05-26 DIAGNOSIS — F17200 Nicotine dependence, unspecified, uncomplicated: Secondary | ICD-10-CM | POA: Diagnosis not present

## 2023-05-26 LAB — POCT FASTING CBG KUC MANUAL ENTRY: POCT Glucose (KUC): 106 mg/dL — AB (ref 70–99)

## 2023-05-26 MED ORDER — PREDNISONE 10 MG (21) PO TBPK: ORAL_TABLET | Freq: Every day | ORAL | 0 refills | Status: AC

## 2023-05-26 MED ORDER — PREDNISONE 10 MG (21) PO TBPK: ORAL_TABLET | Freq: Every day | ORAL | 0 refills | Status: DC

## 2023-05-26 NOTE — Discharge Instructions (Addendum)
Your xray appears negative for fracture, check my chart for official radiology results. Wear supportive shoes and orthotic inserts. Rest,ice,elevate right foot.   Take prednisone taper as directed, avoid alcohol and NSAID results while taking prednisone as you will be at risk of increased bleeding.   Purine diet,see handout.

## 2023-05-26 NOTE — ED Provider Notes (Signed)
[facility]    CSN: [id number] Arrival date & time: 05/26/23  1129      History   Chief Complaint Chief Complaint  Patient presents with   Foot Pain    HPI Derrick Clayton is a 42 y.o. male.   42 year old male pt, Derrick Clayton, presents to urgent care for evaluation of right foot swelling and pain for 5 days.  Patient denies any known injury, awakened with redness and swelling.  Patient states that the pain worsens when he walks and is at the ball of his foot through midfoot.  Patient states he has taken ibuprofen but the pain/swelling came back. Patient endorses smoking and alcohol use.  Patient denies marijuana use but smells strongly of marijuana at today's visit.  PMH of HTN, Aortic dissection w repair in 2019, Family history of gout.    The history is provided by the patient. No language interpreter was used.    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 and open hemi-arch distal anastomosis    Patient Active Problem List   Diagnosis Date Noted   Foot pain, right 05/26/2023   Tooth pain 04/23/2023   Urine frequency 04/23/2023   Encounter for pre-employment examination 01/22/2023   Prediabetes 07/26/2022   Vitamin D deficiency 07/26/2022   Annual physical exam 07/24/2022   Obesity (BMI 30.0-34.9) 07/24/2022   Marijuana smoker 07/24/2022   Pain, dental 07/24/2022   Dizziness 07/24/2022   COVID-19 virus infection 06/26/2022   Rash and other nonspecific skin eruption 11/08/2021   Positive autoantibody screening for celiac disease 11/08/2021   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   Low back pain 09/14/2021   Cutaneous abscess of face 08/22/2021   Thoracic aortic aneurysm without rupture (HCC) 09/25/2020   History of aortic  dissection 07/02/2018   History of paroxysmal atrial tachycardia 07/02/2018   S/P repair ascending aortic dissection 03/11/2018   Uncontrolled hypertension 03/10/2018   Smoker 07/24/2006   Essential hypertension 07/24/2006    Past Surgical History:  Procedure Laterality Date   AORTIC INTERVENTION N/A 03/10/2018   Procedure: RESUSPENSION OF THE NATIVE AORTIC VALVE;  Surgeon: Purcell Nails, MD;  Location: [facility] OR;  Service: Open Heart Surgery;  Laterality: N/A;   AORTIC VALVE REPAIR N/A 03/10/2018   Procedure: REPAIR OF ASCENDING AORTIC DISECTION USING STRAIGHT HEMASHIELD PLATINUM VASCULAR GRAFT; OPEN HEMIARCH DISTAL ANASTOMOSIS;  Surgeon: Purcell Nails, MD;  Location: [facility] OR;  Service: Open Heart Surgery;  Laterality: N/A;   FOOT SURGERY         Home Medications    Prior to Admission medications   Medication Sig Start Date End Date Taking? Authorizing Provider  amLODipine (NORVASC) 10 MG tablet Take 1 tablet (10 mg total) by mouth daily. 01/22/23  Yes Paseda, Baird Kay, FNP  ibuprofen (ADVIL) 600 MG tablet Take 600 mg by mouth 3 (three) times daily as needed. 05/08/23  Yes [provider]  lisinopril-hydrochlorothiazide (ZESTORETIC) 20-25 MG tablet Take 1 tablet by mouth daily. 01/22/23  Yes Paseda, Baird Kay, FNP  metoprolol (TOPROL-XL) 200 MG 24 hr tablet TAKE 1 TABLET BY MOUTH ONCE DAILY WITH MEALS OR IMMEDIATELY FOLLOWING A MEAL 01/22/23  Yes Paseda, Baird Kay, FNP  amoxicillin (AMOXIL) 500 MG tablet Take 500 mg by mouth 3 (three)  times daily. Patient not taking: Reported on 05/26/2023 04/28/23   [provider]  ondansetron (ZOFRAN) 4 MG tablet Take 1 tablet (4 mg total) by mouth every 6 (six) hours. Patient not taking: Reported on 01/22/2023 12/10/22   Barbette Merino, NP  predniSONE (STERAPRED UNI-PAK 21 TAB) 10 MG (21) TBPK tablet Take by mouth daily. 05/26/23   Shirell Struthers, Para March, NP  Vitamin D, Ergocalciferol, (DRISDOL) 1.25 MG (50000 UNIT) CAPS  capsule Take 1 capsule (50,000 Units total) by mouth every 7 (seven) days. Patient not taking: Reported on 04/23/2023 07/26/22   Donell Beers, FNP    Family History Family History  Problem Relation Age of Onset   Cancer Mother    Heart failure Father    High blood pressure Father    Diabetes Father    Asthma Son    Allergic rhinitis Neg Hx    Eczema Neg Hx    Urticaria Neg Hx     Social History Social History   Tobacco Use   Smoking status: Former    Current packs/day: 0.50    Types: Cigarettes    Passive exposure: Past   Smokeless tobacco: Never  Vaping Use   Vaping status: Never Used  Substance Use Topics   Alcohol use: Yes    Comment: 3-4x a week   Drug use: Yes    Frequency: 3.0 times per week    Types: Marijuana     Allergies   Patient has no known allergies.   Review of Systems Review of Systems  Constitutional:  Negative for fever.  Musculoskeletal:  Positive for gait problem and myalgias.  Skin:  Positive for color change.  All other systems reviewed and are negative.    Physical Exam Triage Vital Signs ED Triage Vitals  Encounter Vitals Group     BP 05/26/23 1450 122/73     Systolic BP Percentile --      Diastolic BP Percentile --      Pulse Rate 05/26/23 1450 68     Resp 05/26/23 1450 16     Temp 05/26/23 1450 97.9 F (36.6 C)     Temp Source 05/26/23 1450 Oral     SpO2 05/26/23 1450 96 %     Weight --      Height --      Head Circumference --      Peak Flow --      Pain Score 05/26/23 1453 8     Pain Loc --      Pain Education --      Exclude from Growth Chart --    No data found.  Updated Vital Signs BP 122/73 (BP Location: Left Arm)   Pulse 68   Temp 97.9 F (36.6 C) (Oral)   Resp 16   SpO2 96%   Visual Acuity Right Eye Distance:   Left Eye Distance:   Bilateral Distance:    Right Eye Near:   Left Eye Near:    Bilateral Near:     Physical Exam Vitals and nursing note reviewed.  Cardiovascular:     Pulses:           Dorsalis pedis pulses are 2+ on the right side.  Feet:     Right foot:     Skin integrity: Skin integrity normal.     Comments: +redness,swelling to dorsal aspect right mid foot, skin intact,no fluctuance,no draiange     UC Treatments / Results  Labs (all labs ordered are listed, but only  abnormal results are displayed) Labs Reviewed  POCT FASTING CBG [facility] MANUAL ENTRY - Abnormal; Notable for the following components:      Result Value   POCT Glucose ([facility]) 106 (*)    All other components within normal limits    EKG   Radiology No results found.  Procedures Procedures (including critical care time)  Medications Ordered in UC Medications - No data to display  Initial Impression / Assessment and Plan / UC Course  I have reviewed the triage vital signs and the nursing notes.  Pertinent labs & imaging results that were available during my care of the patient were reviewed by me and considered in my medical decision making (see chart for details).  Clinical Course as of 05/26/23 1553  Mon May 26, 2023  1507 To xray [JD]  1515 Pt returned from xray [JD]  1522 BS is 106, discussed exam findings and plan of care: will treat with prednisone for pain/swelling, back pain w prior prednisone taper use 2023.  Discussed using orthotic inserts for support, supportive shoes, rest,ice,elevate. Encouraged smoking cessation, low purine diet handout given,gout most likely dx. Follow up with PCP, podiatrist-call for appt.  [JD]  1542 Wet red negative,right foot [JD]    Clinical Course User Index [JD] Anjela Cassara, NP    Ddx: Right foot pain, gout, smoker, fracture Final Clinical Impressions(s) / UC Diagnoses   Final diagnoses:  Foot pain, right  Smoker     Discharge Instructions      Your xray appears negative for fracture, check my chart for official radiology results. Wear supportive shoes and orthotic inserts. Rest,ice,elevate right foot.   Take prednisone taper  as directed, avoid alcohol and NSAID results while taking prednisone as you will be at risk of increased bleeding.   Purine diet,see handout.     ED Prescriptions     Medication Sig Dispense Auth. Provider   predniSONE (STERAPRED UNI-PAK 21 TAB) 10 MG (21) TBPK tablet  (Status: Discontinued) Take by mouth daily. 21 tablet Shantia Sanford, NP   predniSONE (STERAPRED UNI-PAK 21 TAB) 10 MG (21) TBPK tablet Take by mouth daily. 21 tablet Ayleah Hofmeister, Para March, NP      PDMP not reviewed this encounter.   Clancy Gourd, NP 05/26/23 1553

## 2023-05-26 NOTE — ED Triage Notes (Signed)
Pt reports right foot swelling and pain x5 days. Reports no injury to area, pt woke up with swelling present. Reports initial numbness that became throbbing pain. Pt reports taking ibuprofen that improved swelling for a day but then came back. Reports family hx of gout.

## 2023-08-19 ENCOUNTER — Other Ambulatory Visit: Payer: Self-pay | Admitting: Nurse Practitioner

## 2023-08-19 DIAGNOSIS — I10 Essential (primary) hypertension: Secondary | ICD-10-CM

## 2023-08-22 ENCOUNTER — Ambulatory Visit: Payer: Self-pay | Admitting: Nurse Practitioner

## 2023-09-17 ENCOUNTER — Ambulatory Visit: Payer: Self-pay | Admitting: Nurse Practitioner

## 2023-09-26 ENCOUNTER — Ambulatory Visit: Payer: Self-pay | Admitting: Nurse Practitioner

## 2024-01-25 ENCOUNTER — Other Ambulatory Visit: Payer: Self-pay | Admitting: Nurse Practitioner

## 2024-01-25 DIAGNOSIS — I10 Essential (primary) hypertension: Secondary | ICD-10-CM

## 2024-01-27 NOTE — Telephone Encounter (Signed)
Pt needs appt [facility]

## 2024-05-07 ENCOUNTER — Other Ambulatory Visit: Payer: Self-pay | Admitting: Nurse Practitioner

## 2024-05-07 DIAGNOSIS — I10 Essential (primary) hypertension: Secondary | ICD-10-CM

## 2024-06-09 ENCOUNTER — Other Ambulatory Visit: Payer: Self-pay | Admitting: Nurse Practitioner

## 2024-06-09 DIAGNOSIS — I10 Essential (primary) hypertension: Secondary | ICD-10-CM

## 2024-06-09 NOTE — Telephone Encounter (Signed)
 lisinopril -hydrochlorothiazide  (ZESTORETIC ) 20-25 MG tablet [Pharmacy Med Name: Lisinopril -hydroCHLOROthiazide  20-25 MG Oral Tablet]      amLODipine  (NORVASC ) 10 MG tablet [Pharmacy Med Name: amLODIPine  Besylate 10 MG Oral Tablet]
